# Patient Record
Sex: Male | Born: 1950 | Race: White | Hispanic: No | Marital: Married | Smoking: Former smoker
Health system: Southern US, Community
[De-identification: ages and names within clinical notes are randomized; demographics above are authoritative.]

## PROBLEM LIST (undated history)

## (undated) DIAGNOSIS — K5790 Diverticulosis of intestine, part unspecified, without perforation or abscess without bleeding: Secondary | ICD-10-CM

## (undated) DIAGNOSIS — M545 Low back pain, unspecified: Secondary | ICD-10-CM

## (undated) DIAGNOSIS — E785 Hyperlipidemia, unspecified: Secondary | ICD-10-CM

## (undated) DIAGNOSIS — K219 Gastro-esophageal reflux disease without esophagitis: Secondary | ICD-10-CM

## (undated) DIAGNOSIS — T7840XA Allergy, unspecified, initial encounter: Secondary | ICD-10-CM

## (undated) DIAGNOSIS — F419 Anxiety disorder, unspecified: Secondary | ICD-10-CM

## (undated) DIAGNOSIS — I10 Essential (primary) hypertension: Secondary | ICD-10-CM

## (undated) DIAGNOSIS — H269 Unspecified cataract: Secondary | ICD-10-CM

## (undated) DIAGNOSIS — D126 Benign neoplasm of colon, unspecified: Secondary | ICD-10-CM

## (undated) DIAGNOSIS — M1990 Unspecified osteoarthritis, unspecified site: Secondary | ICD-10-CM

## (undated) DIAGNOSIS — N400 Enlarged prostate without lower urinary tract symptoms: Secondary | ICD-10-CM

## (undated) HISTORY — DX: Anxiety disorder, unspecified: F41.9

## (undated) HISTORY — DX: Low back pain, unspecified: M54.50

## (undated) HISTORY — DX: Diverticulosis of intestine, part unspecified, without perforation or abscess without bleeding: K57.90

## (undated) HISTORY — PX: COLONOSCOPY: SHX174

## (undated) HISTORY — DX: Allergy, unspecified, initial encounter: T78.40XA

## (undated) HISTORY — DX: Benign neoplasm of colon, unspecified: D12.6

## (undated) HISTORY — DX: Gastro-esophageal reflux disease without esophagitis: K21.9

## (undated) HISTORY — DX: Hyperlipidemia, unspecified: E78.5

## (undated) HISTORY — DX: Essential (primary) hypertension: I10

## (undated) HISTORY — DX: Unspecified osteoarthritis, unspecified site: M19.90

## (undated) HISTORY — DX: Benign prostatic hyperplasia without lower urinary tract symptoms: N40.0

## (undated) HISTORY — PX: ANKLE FRACTURE SURGERY: SHX122

## (undated) HISTORY — DX: Low back pain: M54.5

## (undated) HISTORY — DX: Unspecified cataract: H26.9

## (undated) HISTORY — PX: FRACTURE SURGERY: SHX138

## (undated) HISTORY — PX: HERNIA REPAIR: SHX51

## (undated) HISTORY — PX: FINGER SURGERY: SHX640

---

## 2003-12-17 ENCOUNTER — Ambulatory Visit: Payer: Self-pay | Admitting: Internal Medicine

## 2004-01-07 ENCOUNTER — Ambulatory Visit: Payer: Self-pay | Admitting: Internal Medicine

## 2004-03-22 ENCOUNTER — Ambulatory Visit: Payer: Self-pay | Admitting: Internal Medicine

## 2004-05-16 ENCOUNTER — Ambulatory Visit: Payer: Self-pay | Admitting: Internal Medicine

## 2004-07-31 ENCOUNTER — Ambulatory Visit: Payer: Self-pay | Admitting: Internal Medicine

## 2004-08-04 ENCOUNTER — Ambulatory Visit: Payer: Self-pay | Admitting: Internal Medicine

## 2004-08-18 ENCOUNTER — Ambulatory Visit: Payer: Self-pay | Admitting: Internal Medicine

## 2004-11-02 ENCOUNTER — Ambulatory Visit: Payer: Self-pay | Admitting: Internal Medicine

## 2005-03-05 ENCOUNTER — Ambulatory Visit: Payer: Self-pay | Admitting: Internal Medicine

## 2005-08-06 ENCOUNTER — Ambulatory Visit: Payer: Self-pay | Admitting: Internal Medicine

## 2005-08-13 ENCOUNTER — Ambulatory Visit: Payer: Self-pay | Admitting: Internal Medicine

## 2005-09-07 ENCOUNTER — Ambulatory Visit: Payer: Self-pay | Admitting: Gastroenterology

## 2005-09-18 ENCOUNTER — Encounter (INDEPENDENT_AMBULATORY_CARE_PROVIDER_SITE_OTHER): Payer: Self-pay | Admitting: Specialist

## 2005-09-18 ENCOUNTER — Ambulatory Visit: Payer: Self-pay | Admitting: Gastroenterology

## 2005-09-18 ENCOUNTER — Encounter: Payer: Self-pay | Admitting: Internal Medicine

## 2005-10-19 ENCOUNTER — Ambulatory Visit: Payer: Self-pay | Admitting: Internal Medicine

## 2006-08-19 ENCOUNTER — Ambulatory Visit: Payer: Self-pay | Admitting: Internal Medicine

## 2006-08-19 DIAGNOSIS — I10 Essential (primary) hypertension: Secondary | ICD-10-CM

## 2006-08-19 DIAGNOSIS — E785 Hyperlipidemia, unspecified: Secondary | ICD-10-CM

## 2006-08-19 LAB — CONVERTED CEMR LAB
Bilirubin, Direct: 0.3 mg/dL (ref 0.0–0.3)
CO2: 26 meq/L (ref 19–32)
Calcium: 9.2 mg/dL (ref 8.4–10.5)
Eosinophils Absolute: 0.1 10*3/uL (ref 0.0–0.6)
Eosinophils Relative: 0.8 % (ref 0.0–5.0)
GFR calc Af Amer: 112 mL/min
GFR calc non Af Amer: 93 mL/min
Glucose, Bld: 111 mg/dL — ABNORMAL HIGH (ref 70–99)
Glucose, Urine, Semiquant: NEGATIVE
HDL: 42.8 mg/dL (ref >39.0)
Hemoglobin: 15.3 g/dL (ref 13.0–17.0)
Ketones, urine, test strip: NEGATIVE
Lymphocytes Relative: 12.9 % (ref 12.0–46.0)
MCV: 88.4 fL (ref 78.0–100.0)
Monocytes Absolute: 0.7 10*3/uL (ref 0.2–0.7)
Neutro Abs: 9.1 10*3/uL — ABNORMAL HIGH (ref 1.4–7.7)
Neutrophils Relative %: 80 % — ABNORMAL HIGH (ref 43.0–77.0)
PSA: 0.29 ng/mL (ref 0.10–4.00)
Platelets: 243 10*3/uL (ref 150–400)
Potassium: 4.2 meq/L (ref 3.5–5.1)
Sodium: 145 meq/L (ref 135–145)
Specific Gravity, Urine: 1.02
TSH: 0.91 microintl units/mL (ref 0.35–5.50)
Total Protein: 6.7 g/dL (ref 6.0–8.3)
Triglycerides: 117 mg/dL (ref 0–149)
WBC Urine, dipstick: NEGATIVE
WBC: 11.4 10*3/uL — ABNORMAL HIGH (ref 4.5–10.5)
pH: 7

## 2006-08-26 ENCOUNTER — Ambulatory Visit: Payer: Self-pay | Admitting: Internal Medicine

## 2006-08-26 DIAGNOSIS — K5730 Diverticulosis of large intestine without perforation or abscess without bleeding: Secondary | ICD-10-CM | POA: Insufficient documentation

## 2006-08-26 DIAGNOSIS — Z86010 Personal history of colonic polyps: Secondary | ICD-10-CM | POA: Insufficient documentation

## 2006-08-26 DIAGNOSIS — T887XXA Unspecified adverse effect of drug or medicament, initial encounter: Secondary | ICD-10-CM

## 2006-08-27 ENCOUNTER — Telehealth: Payer: Self-pay | Admitting: *Deleted

## 2006-08-29 ENCOUNTER — Telehealth: Payer: Self-pay | Admitting: *Deleted

## 2006-12-17 ENCOUNTER — Ambulatory Visit: Payer: Self-pay | Admitting: Internal Medicine

## 2006-12-17 DIAGNOSIS — N400 Enlarged prostate without lower urinary tract symptoms: Secondary | ICD-10-CM

## 2006-12-17 DIAGNOSIS — C44599 Other specified malignant neoplasm of skin of other part of trunk: Secondary | ICD-10-CM

## 2006-12-17 DIAGNOSIS — M545 Low back pain, unspecified: Secondary | ICD-10-CM | POA: Insufficient documentation

## 2006-12-17 DIAGNOSIS — L570 Actinic keratosis: Secondary | ICD-10-CM

## 2007-02-10 ENCOUNTER — Telehealth: Payer: Self-pay | Admitting: Internal Medicine

## 2007-04-17 ENCOUNTER — Ambulatory Visit: Payer: Self-pay | Admitting: Internal Medicine

## 2007-08-11 ENCOUNTER — Telehealth: Payer: Self-pay | Admitting: *Deleted

## 2007-08-22 ENCOUNTER — Ambulatory Visit: Payer: Self-pay | Admitting: Internal Medicine

## 2007-08-22 LAB — CONVERTED CEMR LAB
ALT: 19 units/L (ref 0–53)
AST: 16 units/L (ref 0–37)
Albumin: 3.9 g/dL (ref 3.5–5.2)
BUN: 15 mg/dL (ref 6–23)
Basophils Absolute: 0 10*3/uL (ref 0.0–0.1)
Basophils Relative: 0.4 % (ref 0.0–3.0)
Bilirubin Urine: NEGATIVE
CO2: 28 meq/L (ref 19–32)
Calcium: 9 mg/dL (ref 8.4–10.5)
Chloride: 110 meq/L (ref 96–112)
Cholesterol: 146 mg/dL (ref 0–200)
Creatinine, Ser: 0.8 mg/dL (ref 0.4–1.5)
Eosinophils Absolute: 0.1 10*3/uL (ref 0.0–0.7)
Eosinophils Relative: 2 % (ref 0.0–5.0)
Glucose, Urine, Semiquant: NEGATIVE
Hemoglobin: 15 g/dL (ref 13.0–17.0)
Lymphocytes Relative: 24.9 % (ref 12.0–46.0)
MCHC: 34.7 g/dL (ref 30.0–36.0)
MCV: 90 fL (ref 78.0–100.0)
Neutro Abs: 4.3 10*3/uL (ref 1.4–7.7)
Neutrophils Relative %: 65.3 % (ref 43.0–77.0)
PSA: 0.18 ng/mL (ref 0.10–4.00)
RBC: 4.81 M/uL (ref 4.22–5.81)
TSH: 1.24 microintl units/mL (ref 0.35–5.50)
VLDL: 22 mg/dL (ref 0–40)
WBC Urine, dipstick: NEGATIVE
WBC: 6.5 10*3/uL (ref 4.5–10.5)
pH: 7

## 2007-09-16 ENCOUNTER — Ambulatory Visit: Payer: Self-pay | Admitting: Internal Medicine

## 2008-03-19 ENCOUNTER — Ambulatory Visit: Payer: Self-pay | Admitting: Internal Medicine

## 2008-03-19 LAB — CONVERTED CEMR LAB
ALT: 21 units/L (ref 0–53)
AST: 20 units/L (ref 0–37)
Alkaline Phosphatase: 74 units/L (ref 39–117)
Bilirubin, Direct: 0.1 mg/dL (ref 0.0–0.3)
Total Bilirubin: 0.9 mg/dL (ref 0.3–1.2)
Total CHOL/HDL Ratio: 3.3

## 2008-03-26 ENCOUNTER — Ambulatory Visit: Payer: Self-pay | Admitting: Internal Medicine

## 2008-06-17 ENCOUNTER — Telehealth: Payer: Self-pay | Admitting: Internal Medicine

## 2008-06-25 ENCOUNTER — Ambulatory Visit: Payer: Self-pay | Admitting: Internal Medicine

## 2008-09-22 ENCOUNTER — Ambulatory Visit: Payer: Self-pay | Admitting: Internal Medicine

## 2008-09-22 LAB — CONVERTED CEMR LAB
AST: 23 units/L (ref 0–37)
Alkaline Phosphatase: 75 units/L (ref 39–117)
Basophils Absolute: 0 10*3/uL (ref 0.0–0.1)
Calcium: 9 mg/dL (ref 8.4–10.5)
GFR calc non Af Amer: 91.92 mL/min (ref >60)
Glucose, Bld: 106 mg/dL — ABNORMAL HIGH (ref 70–99)
Glucose, Urine, Semiquant: NEGATIVE
HDL: 43.9 mg/dL (ref >39.00)
Hemoglobin: 14.9 g/dL (ref 13.0–17.0)
LDL Cholesterol: 91 mg/dL (ref 0–99)
Lymphocytes Relative: 22.4 % (ref 12.0–46.0)
Monocytes Relative: 7.4 % (ref 3.0–12.0)
Neutro Abs: 5.1 10*3/uL (ref 1.4–7.7)
Nitrite: NEGATIVE
Platelets: 203 10*3/uL (ref 150.0–400.0)
Protein, U semiquant: NEGATIVE
RDW: 11.8 % (ref 11.5–14.6)
Sodium: 142 meq/L (ref 135–145)
Total Bilirubin: 1.1 mg/dL (ref 0.3–1.2)
Urobilinogen, UA: 0.2
VLDL: 17.8 mg/dL (ref 0.0–40.0)
WBC: 7.6 10*3/uL (ref 4.5–10.5)
pH: 7

## 2008-09-29 ENCOUNTER — Ambulatory Visit: Payer: Self-pay | Admitting: Internal Medicine

## 2008-12-03 ENCOUNTER — Ambulatory Visit: Payer: Self-pay | Admitting: Internal Medicine

## 2009-01-13 ENCOUNTER — Telehealth: Payer: Self-pay | Admitting: Internal Medicine

## 2009-04-27 ENCOUNTER — Ambulatory Visit: Payer: Self-pay | Admitting: Internal Medicine

## 2009-04-27 LAB — CONVERTED CEMR LAB
Albumin: 4.1 g/dL (ref 3.5–5.2)
HDL: 52.8 mg/dL (ref >39.00)
LDL Cholesterol: 84 mg/dL (ref 0–99)
Total CHOL/HDL Ratio: 3
Triglycerides: 161 mg/dL — ABNORMAL HIGH (ref 0.0–149.0)

## 2009-05-18 ENCOUNTER — Ambulatory Visit: Payer: Self-pay | Admitting: Internal Medicine

## 2009-06-30 ENCOUNTER — Telehealth: Payer: Self-pay | Admitting: Internal Medicine

## 2009-07-01 ENCOUNTER — Ambulatory Visit: Payer: Self-pay | Admitting: Internal Medicine

## 2009-07-01 DIAGNOSIS — G4700 Insomnia, unspecified: Secondary | ICD-10-CM

## 2009-10-04 ENCOUNTER — Ambulatory Visit: Payer: Self-pay | Admitting: Internal Medicine

## 2009-10-04 LAB — CONVERTED CEMR LAB
Albumin: 4.1 g/dL (ref 3.5–5.2)
Alkaline Phosphatase: 65 units/L (ref 39–117)
Basophils Absolute: 0 10*3/uL (ref 0.0–0.1)
Bilirubin Urine: NEGATIVE
CO2: 25 meq/L (ref 19–32)
Calcium: 8.8 mg/dL (ref 8.4–10.5)
Eosinophils Absolute: 0.2 10*3/uL (ref 0.0–0.7)
Glucose, Bld: 91 mg/dL (ref 70–99)
Glucose, Urine, Semiquant: NEGATIVE
HDL: 46.4 mg/dL (ref >39.00)
Hemoglobin: 14.5 g/dL (ref 13.0–17.0)
Ketones, urine, test strip: NEGATIVE
Lymphocytes Relative: 22.5 % (ref 12.0–46.0)
Lymphs Abs: 1.8 10*3/uL (ref 0.7–4.0)
MCHC: 34.5 g/dL (ref 30.0–36.0)
Monocytes Absolute: 0.5 10*3/uL (ref 0.1–1.0)
Neutro Abs: 5.4 10*3/uL (ref 1.4–7.7)
PSA: 0.21 ng/mL (ref 0.10–4.00)
RDW: 13.4 % (ref 11.5–14.6)
Sodium: 139 meq/L (ref 135–145)
Specific Gravity, Urine: 1.025
TSH: 0.87 microintl units/mL (ref 0.35–5.50)
Triglycerides: 122 mg/dL (ref 0.0–149.0)
pH: 6.5

## 2009-10-11 ENCOUNTER — Ambulatory Visit: Payer: Self-pay | Admitting: Internal Medicine

## 2010-01-11 ENCOUNTER — Ambulatory Visit
Admission: RE | Admit: 2010-01-11 | Discharge: 2010-01-11 | Payer: Self-pay | Source: Home / Self Care | Attending: Internal Medicine | Admitting: Internal Medicine

## 2010-02-01 ENCOUNTER — Telehealth: Payer: Self-pay | Admitting: Internal Medicine

## 2010-02-01 ENCOUNTER — Ambulatory Visit
Admission: RE | Admit: 2010-02-01 | Discharge: 2010-02-01 | Payer: Self-pay | Source: Home / Self Care | Attending: Internal Medicine | Admitting: Internal Medicine

## 2010-02-01 ENCOUNTER — Ambulatory Visit (HOSPITAL_COMMUNITY): Admission: AD | Admit: 2010-02-01 | Discharge: 2010-02-02 | Payer: Self-pay | Source: Home / Self Care

## 2010-02-01 DIAGNOSIS — K4030 Unilateral inguinal hernia, with obstruction, without gangrene, not specified as recurrent: Secondary | ICD-10-CM | POA: Insufficient documentation

## 2010-02-06 LAB — CBC
HCT: 40.3 % (ref 39.0–52.0)
Hemoglobin: 14 g/dL (ref 13.0–17.0)
MCH: 31 pg (ref 26.0–34.0)
MCHC: 34.7 g/dL (ref 30.0–36.0)
MCV: 89.2 fL (ref 78.0–100.0)

## 2010-02-06 LAB — COMPREHENSIVE METABOLIC PANEL
CO2: 25 mEq/L (ref 19–32)
Calcium: 8.9 mg/dL (ref 8.4–10.5)
Creatinine, Ser: 1 mg/dL (ref 0.4–1.5)
GFR calc non Af Amer: >60 mL/min (ref >60)
Glucose, Bld: 92 mg/dL (ref 70–99)

## 2010-02-10 NOTE — Op Note (Signed)
NAMEGABRIELLE, Matthew Watson NO.:  [id number]  MEDICAL RECORD NO.:  [id number]          PATIENT TYPE:  INP  LOCATION:  1538                         FACILITY:  [facility]  PHYSICIAN:  Angelia Mould. Derrell Lolling, M.D.DATE OF BIRTH:  Mar 26, 1950  DATE OF PROCEDURE:  02/01/2010 DATE OF DISCHARGE:                              OPERATIVE REPORT   PREOPERATIVE DIAGNOSIS:  Incarcerated right inguinal hernia.  POSTOPERATIVE DIAGNOSIS:  Incarcerated right inguinal hernia.  OPERATION PERFORMED:  Repair of incarcerated right inguinal hernia with UltraPro mesh Office manager).  SURGEON:  Angelia Mould. Derrell Lolling, M.D.  OPERATIVE INDICATIONS:  This is a 60 year old Caucasian man who presents with a 24-hour history of right groin pain and a bulge.  He thinks he has had a hernia there for some time but has never caused any pain.  On exam, he has a large right inguinal hernia that extends down into the scrotum a little bit and although it is soft and there is no skin redness or inflammation, it cannot be reduced after multiple attempts and ice packs.  He is brought to the operating room urgently.  OPERATIVE FINDINGS:  The patient had a large indirect right inguinal hernia.  Once he was under general anesthesia with muscle relaxation, we were able to slowly reduce this completely.  There are no signs of any infection.  There was no sign of any hemorrhage.  There was no odor.  OPERATIVE TECHNIQUE:  Following the induction of general endotracheal anesthesia, the patient's lower abdomen and genitalia were prepped and draped in sterile fashion.  Intravenous antibiotics were given.  The patient was identified with surgical time-out with correct patient, correct procedure and correct site.  We gently and manually reduced the right inguinal hernia.  A 0.5% Marcaine with epinephrine was used as a local infiltration anesthetic.  An oblique incision was made overlying the right inguinal canal.  Dissection  was carried down through the subcutaneous tissue exposing the aponeurosis of the external oblique. The external oblique was incised in the direction of its fibers, opening up the external inguinal ring.  The external oblique was dissected away from the underlying tissues and self-retaining retractors were placed. There was a chronically scarred complex cord and indirect hernia sac. The ilioinguinal nerve was intimately associated with this and so it was dissected back to its emergence from the muscle laterally, clamped, divided and ligated with 2-0 silk tie.  The  nerve medially was excised.  The cord structures were encircled with Penrose drain.  There really was not much of a direct hernia or weakness there.  He did have a very large indirect hernia sac which was slowly and carefully dissected away from the testicular artery and vein of the vas deferens.  We got this completely freed and dissected all the way back to the internal ring.  We then opened the indirect sac and inspected it.  There were no entrapped contents at this time.  There was no odor and no bleeding.  We twisted the indirect sac and suture-ligated it at the level of the internal ring with suture ligature of 2-0 silk. The redundant sac was excised and  discarded.  The wound was irrigated with saline.  The floor of the inguinal canal was repaired and reinforced with an Onlay graft of UltraPro mesh.  A 3 inch x 6 inch piece of this was brought to the operative field.  It was trimmed at the corners a little bit and sutured in place with running sutures of 2-0 Prolene and interrupted sutures of 2-0 Prolene.  The mesh was sutured so as to generously overlap the fascia at pubic tubercle, then along the inguinal ligament inferiorly.  The mesh was incised laterally so as to wrap around the cord structures of the internal ring.  Medially and superiorly, several interrupted mattress sutures of 2-0 Prolene were placed.  The tails of  the mesh were overlapped laterally.  Some interrupted sutures were placed laterally and one of these was tied to the running suture which had come inferiorly along the inguinal ligament.  I placed a Prolene suture just lateral to the cord structures to tighten up the mesh around the cord.  This provided very secure repair both medial and lateral to the internal ring but allowed an adequate fingertip opening for the cord structures.  There was no bleeding.  The wound was irrigated with saline.  The external oblique was closed with a running suture of 2-0 Vicryl, placing the cord structures deep to the external oblique.  Scarpa fascia was closed with running suture of 2-0 Vicryl.  The skin was closed with running subcuticular suture of 4-0 Monocryl and Steri-Strips.  Clean bandages were placed and the patient was taken recovery room in stable condition. Estimated blood loss was about 20 cc or less.  Complications none. Sponge, needle and instrument counts were correct.     Angelia Mould. Derrell Lolling, M.D.     HMI/MEDQ  D:  02/01/2010  T:  02/01/2010  Job:  [id number]  cc:   Stacie Glaze, MD [address] [place] [address]  Electronically Signed by Claud Kelp M.D. on 02/08/2010 10:00:26 AM

## 2010-02-10 NOTE — Discharge Summary (Signed)
Matthew Watson, Matthew Watson NO.:  [id number]  MEDICAL RECORD NO.:  [id number]          PATIENT TYPE:  INP  LOCATION:  1538                         FACILITY:  [facility]  PHYSICIAN:  Angelia Mould. Derrell Lolling, M.D.DATE OF BIRTH:  1950/10/26  DATE OF ADMISSION:  02/01/2010 DATE OF DISCHARGE:  02/02/2010                              DISCHARGE SUMMARY   ADMISSION DIAGNOSIS:  Incarcerated right inguinal hernia.  DISCHARGE DIAGNOSES: 1. Incarcerated right inguinal hernia. 2. Dyslipidemia. 3. Hypertension. 4. Gastroesophageal reflux disease. 5. Benign prostatic hyperplasia.  PROCEDURES:  Repair of incarcerated right inguinal hernia with Ultrapro mesh Lichtenstein repair on February 01, 2010, Dr. Derrell Lolling.  BRIEF HISTORY:  The patient is a 60 year old male who was seen in [facility] due to a right groin bulge.  He was initially seen by [name], his primary care who found the inguinal hernia and referred him for evaluation.  He had 8/10 pain located in his right lower quadrant.  [name] could not reduce it.  He was seen in the office and admitted.  He had some nausea but no change in bowel or bladder function.  PAST MEDICAL HISTORY:  Hyperlipidemia, hypertension, GERD, and BPH.  PAST SURGICAL HISTORY:  One orthopedic procedure in his leg.  MEDICATIONS ON ADMISSION: 1. Lipitor 40 one daily. 2. Aspirin 81 mg daily. 3. Proscar 5 mg a day. 4. Prilosec 20 mg daily. 5. Vicodin q.6h. p.r.n. for lower back pain. 6. Fexofenadine 1 b.i.d. 7. Amlodipine 5 mg daily. 8. Meloxicam 15 mg daily.  ALLERGIES:  None.  For further history and physical, see the dictated note.  HOSPITAL COURSE:  The patient was admitted to the hospital and after evaluation, was taken to the OR by Dr. Derrell Lolling.  On the OR table, he underwent sedation.  There are no signs of infection.  He was placed on IV antibiotics.  The hernia was reduced and then he underwent the procedure as described  above.  He tolerated the procedure well.  The following morning, he was changed from IV pain medicine to p.o. and mobilized, and it was Dr. Annette Stable opinion that he could be discharged home in the morning after his procedure.   DISCHARGE MEDS: amlodipine/benazepril 5/10 1 QD aspirin 81 mg daily atorvastatin 40mg  qd hydrocodone/APAP p.r.n. q.6h meloxicam 15 mg daily, Prilosec 20 mg daily Proscar 5 mg daily.  Percocet at his request for pain and told to hold his Vicodin while he is on that.   aspirin 325 MG q6h X 7 days, then resume baby asprin 1 daily  FOLLOW UP:  He will come back to the office in 2 weeks on February 14, 2010 for clinic evaluation of his incisions.  He is instructed to remove his dressings in 72 hours.  He can shower, remove Steri-Strips when they start to peel.  He is to call the office if he has any further problems.     Eber Hong, P.A.   ______________________________ Angelia Mould. Derrell Lolling, M.D.    WDJ/MEDQ  D:  02/02/2010  T:  02/02/2010  Job:  [id number]  cc:   [name]  Electronically Signed by Yehuda Mao  JENNINGS P.A. on 02/03/2010 04:04:47 PM Electronically Signed by Claud Kelp M.D. on 02/08/2010 10:01:12 AM

## 2010-02-14 NOTE — Assessment & Plan Note (Signed)
Summary: insomnia/bmw   Vital Signs:  Patient profile:   60 year old male Height:      69 inches Weight:      216 pounds BMI:     32.01 Temp:     98.2 degrees F oral Resp:     14 per minute BP sitting:   120 / 80  (left arm)  Vitals Entered By: Willy Eddy, LPN (July 01, 2009 1:30 PM) CC: c/o insomnia and anxiety attacks, Hypertension Management   CC:  c/o insomnia and anxiety attacks and Hypertension Management.  History of Present Illness: 10-12 days of insomnia at first related to waking up to "pee" cut pout beer and coffee and the urination stopped but the insomnia did not stop having events at work when overwhelmed and has had panic attacks has had transient fatigue increased anxiety no hx of depression blood pressure well controlled and back pain is controlled  Hypertension History:      He denies headache, chest pain, palpitations, dyspnea with exertion, orthopnea, PND, peripheral edema, visual symptoms, neurologic problems, syncope, and side effects from treatment.        Positive major cardiovascular risk factors include male age 31 years old or older, hyperlipidemia, and hypertension.  Negative major cardiovascular risk factors include non-tobacco-user status.     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.0) 5)  Preventive Health Care  (ICD-V70.0) 6)  Diverticulitis, Colon W/hem  (ICD-562.13) 7)  Advef, Drug/medicinal/biological Subst Nos  (ICD-995.20) 8)  Diverticulosis, Colon  (ICD-562.10) 9)  Colonic Polyps, Hx of  (ICD-V12.72) 10)  Family History Diabetes 1st Degree Relative  (ICD-V18.0) 11)  Family History  of Melanoma  (ICD-V16.8) 12)  Benign Prostatic Hypertrophy  (ICD-600.00) 13)  Hypertension  (ICD-401.9) 14)  Hyperlipidemia  (ICD-272.4)  Current Problems (verified): 1)  Benign Prostatic Hypertrophy, With Obstruction  (ICD-600.01) 2)  Low Back Pain  (ICD-724.2) 3)  Neoplasm, Malignant, Skin, 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 Take 1 Tablet By Mouth Once A Day 2)  Aspirin 81 Mg  Tabs (Aspirin) .... Take 1 Tablet By Mouth Once A Day 3)  Proscar 5 Mg  Tabs (Finasteride) .... One By Mouth Daily 4)  Omeprazole 20 Mg  Cpdr (Omeprazole) .... One By Mouth Daily 5)  Hydrocodone-Acetaminophen 5-500 Mg Tabs (Hydrocodone-Acetaminophen) .... One By Mouth Every 6-8 Hours Prn 6)  Fexofenadine-Pseudoephedrine 60-120 Mg Xr12h-Tab (Fexofenadine-Pseudoephedrine) .... One By Mouth Two Times A Day 7)  Amlodipine Besy-Benazepril Hcl 5-10 Mg Caps (Amlodipine Besy-Benazepril Hcl) .... One By Mouth Daily 8)  Meloxicam 15 Mg Tabs (Meloxicam) .... One By  Mouth Daily  Current Medications (verified): 1)  Zocor 80 Mg  Tabs (Simvastatin) .... Take 1 Tablet By Mouth Once A Day 2)  Aspirin 81 Mg  Tabs (Aspirin) .... Take 1 Tablet By Mouth Once A Day 3)  Proscar 5 Mg  Tabs (Finasteride) .... One By Mouth Daily 4)  Omeprazole 20 Mg  Cpdr (Omeprazole) .... One By Mouth Daily 5)  Hydrocodone-Acetaminophen 5-500 Mg Tabs (Hydrocodone-Acetaminophen) .... One By Mouth Every 6-8 Hours Prn 6)   Fexofenadine-Pseudoephedrine 60-120 Mg Xr12h-Tab (Fexofenadine-Pseudoephedrine) .... One By Mouth Two Times A Day 7)  Amlodipine Besy-Benazepril Hcl 5-10 Mg Caps (Amlodipine Besy-Benazepril Hcl) .... One By Mouth Daily 8)  Meloxicam 15 Mg Tabs (Meloxicam) .... One By Mouth Daily 9)  Silenor 6 Mg Tabs (Doxepin Hcl) .... One By Mouth  1/2 Hour Before Bed  Allergies (verified): No Known Drug Allergies  Past History:  Family History: Last updated: 08/19/2006 Family History of Melanoma Family History Other cancer-Bone,Lymph nodes Family History of Arthritis Family History Diabetes 1st degree relative  Social History: Last updated: 12/17/2006 Married Former Smoker  Risk Factors: Smoking Status: quit (07/01/2009) Packs/Day: 1.0 (07/01/2009) Passive Smoke Exposure: no (07/01/2009)  Past medical, surgical, family and social histories (including risk factors) reviewed, and no changes noted (except as noted below).  Past Medical History: Reviewed history from 12/17/2006 and no changes required. Hyperlipidemia Hypertension Benign prostatic hypertrophy Colonic polyps, hx of Diverticulosis, colon Low back pain  Past Surgical History: Reviewed history from 08/26/2006 and no changes required. ankle fx Colonoscopy-09/18/2005 polyps  Family History: Reviewed history from 08/19/2006 and no changes required. Family History of Melanoma Family History Other cancer-Bone,Lymph nodes Family History of Arthritis Family History Diabetes 1st degree relative  Social History: Reviewed history from 12/17/2006 and no changes required. Married Former Smoker  Review of Systems  The patient denies anorexia, fever, weight loss, weight gain, vision loss, decreased hearing, hoarseness, chest pain, syncope, dyspnea on exertion, peripheral edema, prolonged cough, headaches, hemoptysis, abdominal pain, melena, hematochezia, severe indigestion/heartburn, hematuria, incontinence, genital sores, muscle  weakness, suspicious skin lesions, transient blindness, difficulty walking, depression, unusual weight change, abnormal bleeding, enlarged lymph nodes, angioedema, and breast masses.    Physical Exam  General:  Well-developed,well-nourished,in no acute distress; alert,appropriate and cooperative throughout examination Head:  Normocephalic and atraumatic without obvious abnormalities. No apparent alopecia or balding. Eyes:  vision grossly intact, pupils equal, and pupils round.   Nose:  no external deformity and no nasal discharge.   Mouth:  Oral mucosa and oropharynx without lesions or exudates.  Teeth in good repair. Neck:  No deformities, masses, or tenderness noted. Lungs:  normal respiratory effort and no wheezes.   Heart:  normal rate and regular rhythm.   Abdomen:  soft, normal bowel sounds, no distention, no guarding, no rigidity, no rebound tenderness, epigastric tenderness, and LLQ tenderness.   Msk:  lumbar lordosis, SI joint tenderness, and trigger point tenderness.   Neurologic:  alert & oriented X3 and finger-to-nose normal.     Impression & Recommendations:  Problem # 1:  INSOMNIA, CHRONIC (ICD-307.42) samples given and sleep hygene discussed  Problem # 2:  LOW BACK PAIN (ICD-724.2) stable His updated medication list for this problem includes:    Aspirin 81 Mg Tabs (Aspirin) .Marland Kitchen... Take 1 tablet by mouth once a day    Hydrocodone-acetaminophen 5-500 Mg Tabs (Hydrocodone-acetaminophen) ..... One by mouth every 6-8 hours prn    Meloxicam 15 Mg Tabs (Meloxicam) ..... One by mouth daily  Problem # 3:  HYPERTENSION (ICD-401.9) stable His updated medication list for this problem includes:    Amlodipine Besy-benazepril Hcl 5-10 Mg Caps (Amlodipine besy-benazepril hcl) ..... One by mouth daily  BP today: 120/80 Prior BP: 130/84 (05/18/2009)  10 Yr Risk Heart Disease: 4 % Prior 10 Yr Risk Heart Disease: 6 % (05/18/2009)  Labs Reviewed: K+: 4.1 (09/22/2008) Creat: : 0.9  (09/22/2008)   Chol: 169 (04/27/2009)   HDL: 52.80 (04/27/2009)   LDL: 84 (04/27/2009)   TG: 161.0 (04/27/2009)  Complete Medication List: 1)  Zocor 80 Mg Tabs (Simvastatin) .... Take 1 tablet by mouth once a day 2)  Aspirin 81 Mg Tabs (Aspirin) .... Take 1 tablet by mouth once a day 3)  Proscar 5 Mg Tabs (Finasteride) .... One by mouth daily 4)  Omeprazole 20 Mg Cpdr (Omeprazole) .... One by mouth daily 5)  Hydrocodone-acetaminophen 5-500 Mg Tabs (Hydrocodone-acetaminophen) .... One by mouth every 6-8 hours prn 6)  Fexofenadine-pseudoephedrine 60-120 Mg Xr12h-tab (Fexofenadine-pseudoephedrine) .... One by mouth two times a day 7)  Amlodipine Besy-benazepril Hcl 5-10 Mg Caps (Amlodipine besy-benazepril hcl) .... One by mouth daily 8)  Meloxicam 15 Mg Tabs (Meloxicam) .... One by mouth daily 9)  Silenor 6 Mg Tabs (Doxepin hcl) .... One by mouth  1/2 hour before bed  Hypertension Assessment/Plan:      The patient's hypertensive risk group is category B: At least one risk factor (excluding diabetes) with no target organ damage.  His calculated 10 year risk of coronary heart disease is 4 %.  Today's blood pressure is 120/80.  His blood pressure goal is < 140/90.  Patient Instructions: 1)  samples given and side efects addressed 2)  f/u in august as scheduled

## 2010-02-14 NOTE — Progress Notes (Signed)
Summary: insomnia  Phone Note From Other Clinic Call back at [facility] Phone [phone]   Caller: Patient Call For: Stacie Glaze MD Reason for Call: Privacy/Consent Authorization, Referral Summary of Call: Pt is asking to see [name] for insomnia x 10 days.  States he had an anxiety attack last week. Karin Golden [facility]) [phone] Initial call taken by: Lynann Beaver CMA,  June 30, 2009 9:35 AM  Follow-up for Phone Call        may see dr Lovell Sheehan tomorrow at 1:15 Follow-up by: Willy Eddy, LPN,  June 30, 2009 10:21 AM

## 2010-02-14 NOTE — Assessment & Plan Note (Signed)
Summary: 5 MO ROV/MM---- PT [facility] // RS   Vital Signs:  Patient profile:   60 year old male Height:      69 inches Weight:      216 pounds BMI:     32.01 Temp:     98.8 degrees F oral Pulse rate:   76 / minute Resp:     14 per minute BP sitting:   130 / 84  (left arm)  Vitals Entered By: Willy Eddy, LPN (May 18, 1608 10:46 AM)  Nutrition Counseling: Patient's BMI is greater than 25 and therefore counseled on weight management options. CC: roa labs, Hypertension Management   CC:  roa labs and Hypertension Management.  History of Present Illness: The pt presents for managemtn of lipids and chronic back pain as well as HTN  Hypertension History:      He denies headache, chest pain, palpitations, dyspnea with exertion, orthopnea, PND, peripheral edema, visual symptoms, neurologic problems, syncope, and side effects from treatment.  He notes no problems with any antihypertensive medication side effects.  good controil at goals.        Positive major cardiovascular risk factors include male age 21 years old or older, hyperlipidemia, and hypertension.  Negative major cardiovascular risk factors include non-tobacco-user status.     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.0) 5)  Preventive Health Care  (ICD-V70.0) 6)  Diverticulitis, Colon W/hem  (ICD-562.13) 7)  Advef, Drug/medicinal/biological Subst Nos  (ICD-995.20) 8)  Diverticulosis, Colon  (ICD-562.10) 9)  Colonic Polyps, Hx of  (ICD-V12.72) 10)  Family History Diabetes 1st Degree Relative  (ICD-V18.0) 11)  Family History of Melanoma  (ICD-V16.8) 12)  Benign Prostatic  Hypertrophy  (ICD-600.00) 13)  Hypertension  (ICD-401.9) 14)  Hyperlipidemia  (ICD-272.4)  Current Problems (verified): 1)  Benign Prostatic Hypertrophy, With Obstruction  (ICD-600.01) 2)  Low Back Pain  (ICD-724.2) 3)  Neoplasm, Malignant, Skin, 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 Take 1 Tablet By Mouth Once A Day 2)  Aspirin 81 Mg  Tabs (Aspirin) .... Take 1 Tablet By Mouth Once A Day 3)  Proscar 5 Mg  Tabs (Finasteride) .... One By Mouth Daily 4)  Omeprazole 20 Mg  Cpdr (Omeprazole) .... One By Mouth Daily 5)  Hydrocodone-Acetaminophen 5-500 Mg Tabs (Hydrocodone-Acetaminophen) .... One By Mouth Every 6-8 Hours Prn 6)  Fexofenadine-Pseudoephedrine 60-120 Mg Xr12h-Tab (Fexofenadine-Pseudoephedrine) .... One By Mouth Two Times A Day 7)  Amlodipine Besy-Benazepril Hcl 5-10 Mg Caps (Amlodipine Besy-Benazepril Hcl) .... One By Mouth Daily 8)  Meloxicam 15 Mg Tabs (Meloxicam) .... One By Mouth Daily  9)  Amoxicillin 500 Mg Caps (Amoxicillin) .... One Po Three Times A Day  Current Medications (verified): 1)  Zocor 80 Mg  Tabs (Simvastatin) .... Take 1 Tablet By Mouth Once A Day 2)  Aspirin 81 Mg  Tabs (Aspirin) .... Take 1 Tablet By Mouth Once A Day 3)  Proscar 5 Mg  Tabs (Finasteride) .... One By Mouth Daily 4)  Omeprazole 20 Mg  Cpdr (Omeprazole) .... One By Mouth Daily 5)  Hydrocodone-Acetaminophen 5-500 Mg Tabs (Hydrocodone-Acetaminophen) .... One By Mouth  Every 6-8 Hours Prn 6)  Fexofenadine-Pseudoephedrine 60-120 Mg Xr12h-Tab (Fexofenadine-Pseudoephedrine) .... One By Mouth Two Times A Day 7)  Amlodipine Besy-Benazepril Hcl 5-10 Mg Caps (Amlodipine Besy-Benazepril Hcl) .... One By Mouth Daily 8)  Meloxicam 15 Mg Tabs (Meloxicam) .... One By Mouth Daily  Allergies (verified): No Known Drug Allergies  Past History:  Family History: Last updated: 08/19/2006 Family History of Melanoma Family History Other cancer-Bone,Lymph nodes Family History of Arthritis Family History Diabetes 1st degree relative  Social History: Last updated: 12/17/2006 Married Former Smoker  Risk Factors: Smoking Status: quit (05/18/2009) Packs/Day: 1.0 (05/18/2009) Passive Smoke Exposure: no (05/18/2009)  Past medical, surgical, family and social histories (including risk factors) reviewed, and no changes noted (except as noted below).  Past Medical History: Reviewed history from 12/17/2006 and no changes required. Hyperlipidemia Hypertension Benign prostatic hypertrophy Colonic polyps, hx of Diverticulosis, colon Low back pain  Past Surgical History: Reviewed history from 08/26/2006 and no changes required. ankle fx Colonoscopy-09/18/2005 polyps  Family History: Reviewed history from 08/19/2006 and no changes required. Family History of Melanoma Family History Other cancer-Bone,Lymph nodes Family History of Arthritis Family History Diabetes 1st degree relative  Social History: Reviewed history from 12/17/2006 and no changes required. Married Former Smoker  Review of Systems  The patient denies anorexia, fever, weight loss, weight gain, vision loss, decreased hearing, hoarseness, chest pain, syncope, dyspnea on exertion, peripheral edema, prolonged cough, headaches, hemoptysis, abdominal pain, melena, hematochezia, severe indigestion/heartburn, hematuria, incontinence, genital sores, muscle weakness, suspicious skin lesions, transient  blindness, difficulty walking, depression, unusual weight change, abnormal bleeding, enlarged lymph nodes, angioedema, and breast masses.    Physical Exam  General:  Well-developed,well-nourished,in no acute distress; alert,appropriate and cooperative throughout examination Head:  Normocephalic and atraumatic without obvious abnormalities. No apparent alopecia or balding. Eyes:  vision grossly intact, pupils equal, and pupils round.   Nose:  no external deformity and no nasal discharge.   Neck:  No deformities, masses, or tenderness noted. Lungs:  normal respiratory effort and no wheezes.   Heart:  normal rate and regular rhythm.   Abdomen:  soft, normal bowel sounds, no distention, no guarding, no rigidity, no rebound tenderness, epigastric tenderness, and LLQ tenderness.   Msk:  lumbar lordosis, SI joint tenderness, and trigger point tenderness.   Extremities:  trace left pedal edema and trace right pedal edema.   Neurologic:  alert & oriented X3 and finger-to-nose normal.     Impression & Recommendations:  Problem # 1:  HYPERTENSION (ICD-401.9) Assessment Improved  His updated medication list for this problem includes:    Amlodipine Besy-benazepril Hcl 5-10 Mg Caps (Amlodipine besy-benazepril hcl) ..... One by mouth daily  BP today: 140/84 repeat 130/84 Prior BP: 130/80 (12/03/2008)  Prior 10 Yr Risk Heart Disease: 7 % (04/17/2007)  Labs Reviewed: K+: 4.1 (09/22/2008) Creat: : 0.9 (09/22/2008)   Chol: 169 (04/27/2009)   HDL: 52.80 (04/27/2009)   LDL: 84 (04/27/2009)   TG: 161.0 (04/27/2009)  Problem # 2:  HYPERLIPIDEMIA (ICD-272.4) Assessment: Unchanged stable with good liver functions weiught rediuction to aid the lower triglycerides His updated medication list for this problem includes:    Zocor 80 Mg Tabs (Simvastatin) .Marland Kitchen... Take 1 tablet by mouth once a day  Labs Reviewed: SGOT: 22 (04/27/2009)   SGPT: 25 (04/27/2009)  Prior 10 Yr Risk Heart Disease: 7 %  (04/17/2007)   HDL:52.80 (04/27/2009), 43.90 (09/22/2008)  LDL:84 (04/27/2009), 91 (09/22/2008)  Chol:169 (04/27/2009), 153 (09/22/2008)  Trig:161.0 (04/27/2009), 89.0 (09/22/2008)  Problem # 3:  BENIGN PROSTATIC HYPERTROPHY (ICD-600.00) Assessment: Unchanged improved His updated medication list for this problem includes:    Proscar 5 Mg Tabs (Finasteride) ..... One by mouth daily  Problem # 4:  LOW BACK PAIN (ICD-724.2) Assessment: Unchanged  refill meds His updated medication list for this problem includes:    Aspirin 81 Mg Tabs (Aspirin) .Marland Kitchen... Take 1 tablet by mouth once a day    Hydrocodone-acetaminophen 5-500 Mg Tabs (Hydrocodone-acetaminophen) ..... One by mouth every 6-8 hours prn    Meloxicam 15 Mg Tabs (Meloxicam) ..... One by mouth daily  Discussed use of moist heat or ice, modified activities, medications, and stretching/strengthening exercises. Back care instructions given. To be seen in 2 weeks if no improvement; sooner if worsening of symptoms.   Complete Medication List: 1)  Zocor 80 Mg Tabs (Simvastatin) .... Take 1 tablet by mouth once a day 2)  Aspirin 81 Mg Tabs (Aspirin) .... Take 1 tablet by mouth once a day 3)  Proscar 5 Mg Tabs (Finasteride) .... One by mouth daily 4)  Omeprazole 20 Mg Cpdr (Omeprazole) .... One by mouth daily 5)  Hydrocodone-acetaminophen 5-500 Mg Tabs (Hydrocodone-acetaminophen) .... One by mouth every 6-8 hours prn 6)  Fexofenadine-pseudoephedrine 60-120 Mg Xr12h-tab (Fexofenadine-pseudoephedrine) .... One by mouth two times a day 7)  Amlodipine Besy-benazepril Hcl 5-10 Mg Caps (Amlodipine besy-benazepril hcl) .... One by mouth daily 8)  Meloxicam 15 Mg Tabs (Meloxicam) .... One by mouth daily  Hypertension Assessment/Plan:      The patient's hypertensive risk group is category B: At least one risk factor (excluding diabetes) with no target organ damage.  His calculated 10 year risk of coronary heart disease is 6 %.  Today's blood pressure is  130/84.  His blood pressure goal is < 140/90.  Patient Instructions: 1)  AUG for CPX Prescriptions: HYDROCODONE-ACETAMINOPHEN 5-500 MG TABS (HYDROCODONE-ACETAMINOPHEN) one by mouth every 6-8 hours prn  #60 x 6   Entered by:   Willy Eddy, LPN   Authorized by:   Stacie Glaze MD   Signed by:   Willy Eddy, LPN on 78/29/5621   Method used:   Print then Give to Patient   RxID:   [id number]

## 2010-02-14 NOTE — Assessment & Plan Note (Signed)
Summary: cpx/njr/pt rescd from bump//ccm   Vital Signs:  Patient profile:   60 year old male Height:      69 inches Weight:      218 pounds BMI:     32.31 Temp:     98.2 degrees F oral Pulse rate:   72 / minute Resp:     14 per minute BP sitting:   140 / 80  (left arm)  Vitals Entered By: Willy Eddy, LPN (October 11, 2009 11:48 AM)  Nutrition Counseling: Patient's BMI is greater than 25 and therefore counseled on weight management options. CC: cpx, Hypertension Management Is Patient Diabetic? No   Primary Care Provider:  Stacie Glaze MD  CC:  cpx and Hypertension Management.  History of Present Illness: The pt was asked about all immunizations, health maint. services that are appropriate to their age and was given guidance on diet exercize  and weight management  slight increased weight with managemnt councillng   Hypertension History:      He denies headache, chest pain, palpitations, dyspnea with exertion, orthopnea, PND, peripheral edema, visual symptoms, neurologic problems, syncope, and side effects from treatment.  stable.        Positive major cardiovascular risk factors include male age 38 years old or older, hyperlipidemia, and hypertension.  Negative major cardiovascular risk factors include non-tobacco-user status.     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) 5)  Actinic Keratosis  (ICD-702.0) 6)  Preventive Health Care  (ICD-V70.0) 7)  Diverticulitis, Colon W/hem  (ICD-562.13) 8)  Advef, Drug/medicinal/biological Subst Nos  (ICD-995.20) 9)  Diverticulosis, Colon  (ICD-562.10) 10)  Colonic Polyps,  Hx of  (ICD-V12.72) 11)  Family History Diabetes 1st Degree Relative  (ICD-V18.0) 12)  Family History of Melanoma  (ICD-V16.8) 13)  Benign Prostatic Hypertrophy  (ICD-600.00) 14)  Hypertension  (ICD-401.9) 15)  Hyperlipidemia  (ICD-272.4)  Current Problems (verified): 1)  Insomnia, Chronic  (ICD-307.42) 2)  Benign Prostatic Hypertrophy, With Obstruction  (ICD-600.01) 3)  Low Back Pain  (ICD-724.2) 4)  Neoplasm, Malignant, Skin, 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 Take 1 Tablet By Mouth Once A Day 2)  Aspirin 81 Mg  Tabs (Aspirin) .... Take 1 Tablet By Mouth Once A Day 3)  Proscar 5 Mg  Tabs (Finasteride) .... One By Mouth Daily 4)  Omeprazole 20 Mg  Cpdr (Omeprazole) .... One By Mouth Daily 5)  Hydrocodone-Acetaminophen 5-500 Mg Tabs (Hydrocodone-Acetaminophen) .... One By Mouth Every 6-8 Hours Prn 6)  Fexofenadine-Pseudoephedrine 60-120 Mg Xr12h-Tab (Fexofenadine-Pseudoephedrine) .... One By Mouth Two Times A Day 7)  Amlodipine  Besy-Benazepril Hcl 5-10 Mg Caps (Amlodipine Besy-Benazepril Hcl) .... One By Mouth Daily 8)  Meloxicam 15 Mg Tabs (Meloxicam) .... One By Mouth Daily 9)  Silenor 6 Mg Tabs (Doxepin Hcl) .... One By Mouth  1/2 Hour Before Bed  Current Medications (verified): 1)  Zocor 80 Mg  Tabs (Simvastatin) .... Take 1 Tablet By Mouth Once A Day 2)  Aspirin 81 Mg  Tabs (Aspirin) .... Take 1 Tablet By Mouth Once A Day 3)  Proscar 5 Mg  Tabs  (Finasteride) .... One By Mouth Daily 4)  Omeprazole 20 Mg  Cpdr (Omeprazole) .... One By Mouth Daily 5)  Hydrocodone-Acetaminophen 5-500 Mg Tabs (Hydrocodone-Acetaminophen) .... One By Mouth Every 6-8 Hours Prn 6)  Fexofenadine-Pseudoephedrine 60-120 Mg Xr12h-Tab (Fexofenadine-Pseudoephedrine) .... One By Mouth Two Times A Day 7)  Amlodipine Besy-Benazepril Hcl 5-10 Mg Caps (Amlodipine Besy-Benazepril Hcl) .... One By Mouth Daily 8)  Meloxicam 15 Mg Tabs (Meloxicam) .... One By Mouth Daily  Allergies (verified): No Known Drug Allergies  Past History:  Family History: Last updated: 08/19/2006 Family History of Melanoma Family History Other cancer-Bone,Lymph nodes Family History of Arthritis Family History Diabetes 1st degree relative  Social History: Last updated: 12/17/2006 Married Former Smoker  Risk Factors: Smoking Status: quit (10/11/2009) Packs/Day: 1.0 (10/11/2009) Passive Smoke Exposure: no (10/11/2009)  Past medical, surgical, family and social histories (including risk factors) reviewed, and no changes noted (except as noted below).  Past Medical History: Reviewed history from 12/17/2006 and no changes required. Hyperlipidemia Hypertension Benign prostatic hypertrophy Colonic polyps, hx of Diverticulosis, colon Low back pain  Past Surgical History: Reviewed history from 08/26/2006 and no changes required. ankle fx Colonoscopy-09/18/2005 polyps  Family History: Reviewed history from 08/19/2006 and no changes required. Family History of Melanoma Family History Other cancer-Bone,Lymph nodes Family History of Arthritis Family History Diabetes 1st degree relative  Social History: Reviewed history from 12/17/2006 and no changes required. Married Former Smoker  Review of Systems  The patient denies anorexia, fever, weight loss, weight gain, vision loss, decreased hearing, hoarseness, chest pain, syncope, dyspnea on exertion, peripheral edema, prolonged  cough, headaches, hemoptysis, abdominal pain, melena, hematochezia, severe indigestion/heartburn, hematuria, incontinence, genital sores, muscle weakness, suspicious skin lesions, transient blindness, difficulty walking, depression, unusual weight change, abnormal bleeding, enlarged lymph nodes, angioedema, and breast masses.    Physical Exam  General:  Well-developed,well-nourished,in no acute distress; alert,appropriate and cooperative throughout examination Head:  Normocephalic and atraumatic without obvious abnormalities. No apparent alopecia or balding. Eyes:  vision grossly intact, pupils equal, and pupils round.   Nose:  no external deformity and no nasal discharge.   Mouth:  Oral mucosa and oropharynx without lesions or exudates.  Teeth in good repair. Neck:  No deformities, masses, or tenderness noted. Lungs:  normal respiratory effort and no wheezes.   Heart:  normal rate and regular rhythm.   Abdomen:  soft, normal bowel sounds, no distention, no guarding, no rigidity, no rebound tenderness, epigastric tenderness, and LLQ tenderness.     Impression & Recommendations:  Problem # 1:  PREVENTIVE HEALTH CARE (ICD-V70.0) The pt was asked about all immunizations, health maint. services that are appropriate to their age and was given guidance on diet exercize  and weight management  Colonoscopy: Results: Polyp.  Results: Diverticulosis.       Location:  [facility] .   (09/18/2005) Td Booster: Historical (01/15/2005)   Flu Vax: Historical (11/18/2008)   Chol: 166 (10/04/2009)   HDL: 46.40 (10/04/2009)   LDL: 95 (10/04/2009)  TG: 122.0 (10/04/2009) TSH: 0.87 (10/04/2009)   PSA: 0.21 (10/04/2009) Next Colonoscopy due:: 09/2010 (08/26/2006)  Discussed using sunscreen, use of alcohol, drug use, self testicular exam, routine dental care, routine eye care, routine physical exam, seat belts, multiple vitamins, osteoporosis prevention, adequate calcium intake in diet, and recommendations for  immunizations.  Discussed exercise and checking cholesterol.  Discussed gun safety, safe sex, and contraception. Also recommend checking PSA.  Problem # 2:  HYPERTENSION (ICD-401.9) Assessment: Unchanged  His updated medication list for this problem includes:    Amlodipine Besy-benazepril Hcl 5-10 Mg Caps (Amlodipine besy-benazepril hcl) ..... One by mouth daily  BP today: 140/80 Prior BP: 120/80 (07/01/2009)  10 Yr Risk Heart Disease: 7 % Prior 10 Yr Risk Heart Disease: 4 % (07/01/2009)  Labs Reviewed: K+: 4.2 (10/04/2009) Creat: : 0.8 (10/04/2009)   Chol: 166 (10/04/2009)   HDL: 46.40 (10/04/2009)   LDL: 95 (10/04/2009)   TG: 122.0 (10/04/2009)  Problem # 3:  LOW BACK PAIN (ICD-724.2)  His updated medication list for this problem includes:    Aspirin 81 Mg Tabs (Aspirin) .Marland Kitchen... Take 1 tablet by mouth once a day    Hydrocodone-acetaminophen 5-500 Mg Tabs (Hydrocodone-acetaminophen) ..... One by mouth every 6-8 hours prn    Meloxicam 15 Mg Tabs (Meloxicam) ..... One by mouth daily  Discussed use of moist heat or ice, modified activities, medications, and stretching/strengthening exercises. Back care instructions given. To be seen in 2 weeks if no improvement; sooner if worsening of symptoms.   Problem # 4:  PREVENTIVE HEALTH CARE (ICD-V70.0) Assessment: Unchanged  Colonoscopy: Results: Polyp.  Results: Diverticulosis.       Location:  [facility] .   (09/18/2005) Td Booster: Historical (01/15/2005)   Flu Vax: Historical (11/18/2008)   Chol: 166 (10/04/2009)   HDL: 46.40 (10/04/2009)   LDL: 95 (10/04/2009)   TG: 122.0 (10/04/2009) TSH: 0.87 (10/04/2009)   PSA: 0.21 (10/04/2009) Next Colonoscopy due:: 09/2010 (08/26/2006)  Discussed using sunscreen, use of alcohol, drug use, self testicular exam, routine dental care, routine eye care, routine physical exam, seat belts, multiple vitamins, osteoporosis prevention, adequate calcium intake in diet, and recommendations for immunizations.   Discussed exercise and checking cholesterol.  Discussed gun safety, safe sex, and contraception. Also recommend checking PSA.  Complete Medication List: 1)  Lipitor 40 Mg Tabs (Atorvastatin calcium) .... One by mouth daily 2)  Aspirin 81 Mg Tabs (Aspirin) .... Take 1 tablet by mouth once a day 3)  Proscar 5 Mg Tabs (Finasteride) .... One by mouth daily 4)  Omeprazole 20 Mg Cpdr (Omeprazole) .... One by mouth daily 5)  Hydrocodone-acetaminophen 5-500 Mg Tabs (Hydrocodone-acetaminophen) .... One by mouth every 6-8 hours prn 6)  Fexofenadine-pseudoephedrine 60-120 Mg Xr12h-tab (Fexofenadine-pseudoephedrine) .... One by mouth two times a day 7)  Amlodipine Besy-benazepril Hcl 5-10 Mg Caps (Amlodipine besy-benazepril hcl) .... One by mouth daily 8)  Meloxicam 15 Mg Tabs (Meloxicam) .... One by mouth daily  Hypertension Assessment/Plan:      The patient's hypertensive risk group is category B: At least one risk factor (excluding diabetes) with no target organ damage.  His calculated 10 year risk of coronary heart disease is 7 %.  Today's blood pressure is 140/80.  His blood pressure goal is < 140/90.  Patient Instructions: 1)  Please schedule a follow-up appointment in 3 months. Prescriptions: HYDROCODONE-ACETAMINOPHEN 5-500 MG TABS (HYDROCODONE-ACETAMINOPHEN) one by mouth every 6-8 hours prn  #90 x 3   Entered and Authorized by:   Stacie Glaze MD  Signed by:   Stacie Glaze MD on 10/11/2009   Method used:   Print then Give to Patient   RxID:   [id number] LIPITOR 40 MG TABS (ATORVASTATIN CALCIUM) one by mouth daily  #30 x 11   Entered and Authorized by:   Stacie Glaze MD   Signed by:   Stacie Glaze MD on 10/11/2009   Method used:   Electronically to        [facility]* (retail)       [address] [place], [place]  [address]       Ph: [phone]       Fax: [phone]   RxID:   [id number]

## 2010-02-16 ENCOUNTER — Telehealth: Payer: Self-pay | Admitting: *Deleted

## 2010-02-16 NOTE — Assessment & Plan Note (Signed)
Summary: flu shot/njr   Nurse Visit   Allergies: No Known Drug Allergies  Immunizations Administered:  Influenza Vaccine # 1:    Vaccine Type: Fluvax 3+    Site: right deltoid    Mfr: [employer] Pasteur    Dose: 0.5 ml    Route: IM    Given by: Duard Brady LPN    Exp. Date: 07/15/2010    Lot #: [id number]    VIS given: 08/09/09 version given January 11, 2010.    Physician counseled: yes  Flu Vaccine Consent Questions:    Do you have a history of severe allergic reactions to this vaccine? no    Any prior history of allergic reactions to egg and/or gelatin? no    Do you have a sensitivity to the preservative Thimersol? no    Do you have a past history of Guillan-Barre Syndrome? no    Do you currently have an acute febrile illness? no    Have you ever had a severe reaction to latex? no    Vaccine information given and explained to patient? yes  Orders Added: 1)  Flu Vaccine 83yrs + [90658] 2)  Admin 1st Vaccine [62952]

## 2010-02-16 NOTE — Telephone Encounter (Signed)
Violent vomiting x 15 hours and had recent hernia surgery (inguinal) that was incarcerated.  No fever, and is having diarrhea.  Cannot any fluids down.  Needs advice and he will also call is Careers adviser.

## 2010-02-16 NOTE — Telephone Encounter (Signed)
Pt and wife called and told to go to [facility]

## 2010-02-16 NOTE — Progress Notes (Signed)
  Phone Note Call from Patient   Summary of Call: ? hernia in groin and cannot  get comfortable.  Would like to see Dr Lovell Sheehan this am ASAP. Scheduled this am.  Initial call taken by: Lynann Beaver CMA AAMA,  February 01, 2010 8:31 AM

## 2010-02-16 NOTE — Telephone Encounter (Signed)
Go to ER after surgery for incarceration this could be obstruction... This is an emergency

## 2010-02-16 NOTE — Assessment & Plan Note (Signed)
Summary: ing/hernia/dm   Vital Signs:  Patient profile:   60 year old male Height:      69 inches Weight:      218 pounds BMI:     32.31 Temp:     98.2 degrees F oral Pulse rate:   72 / minute Resp:     14 per minute BP sitting:   130 / 80  (left arm)  Vitals Entered By: Willy Eddy, LPN (February 01, 2010 12:40 PM) CC: rt inguinal hernia pain- difficulty walking an d asitting down Is Patient Diabetic? No   Primary Care Provider:  Stacie Glaze MD  CC:  rt inguinal hernia pain- difficulty walking an d asitting down.  History of Present Illness: The pt presents with a mass in his right scrotum.Marland KitchenMarland KitchenThe pt has pain and an evident mass He notes that the mass first appeared in Nov but reduced spontaneously.  The mass reccurred today and did not reduce. I cannot reduce it in the office due to pain. He states that he has had normal bowel movements up until today. he rates the pain as 8/10. He is on pain mendcation from chronic back pain.  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) 5)  Actinic Keratosis  (ICD-702.0) 6)  Preventive Health Care  (ICD-V70.0) 7)  Diverticulitis, Colon W/hem  (ICD-562.13) 8)  Advef, Drug/medicinal/biological Subst Nos  (ICD-995.20) 9)  Diverticulosis, Colon  (ICD-562.10) 10)  Colonic Polyps, Hx of  (ICD-V12.72) 11)  Family History Diabetes 1st Degree Relative  (ICD-V18.0) 12)  Family History of Melanoma  (ICD-V16.8) 13)  Benign Prostatic Hypertrophy  (ICD-600.00) 14)  Hypertension  (ICD-401.9) 15)  Hyperlipidemia  (ICD-272.4)  Current Problems (verified): 1)  Insomnia, Chronic  (ICD-307.42) 2)   Benign Prostatic Hypertrophy, With Obstruction  (ICD-600.01) 3)  Low Back Pain  (ICD-724.2) 4)  Neoplasm, Malignant, Skin, 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 One By Mouth Daily 2)  Aspirin 81 Mg  Tabs (Aspirin) .... Take 1 Tablet By Mouth Once A Day 3)  Proscar 5 Mg  Tabs (Finasteride) .... One By Mouth Daily 4)  Omeprazole 20 Mg  Cpdr (Omeprazole) .... One By Mouth Daily 5)  Hydrocodone-Acetaminophen 5-500 Mg Tabs (Hydrocodone-Acetaminophen) .... One By Mouth Every 6-8 Hours Prn 6)  Fexofenadine-Pseudoephedrine 60-120 Mg Xr12h-Tab (Fexofenadine-Pseudoephedrine) .... One By Mouth Two Times A Day 7)  Amlodipine Besy-Benazepril Hcl 5-10 Mg Caps (Amlodipine Besy-Benazepril Hcl) .... One By Mouth Daily 8)  Meloxicam 15 Mg Tabs (Meloxicam) .... One By Mouth Daily  Current Medications (verified): 1)  Lipitor 40 Mg Tabs (Atorvastatin Calcium) .Marland KitchenMarland KitchenMarland Kitchen  One By Mouth Daily 2)  Aspirin 81 Mg  Tabs (Aspirin) .... Take 1 Tablet By Mouth Once A Day 3)  Proscar 5 Mg  Tabs (Finasteride) .... One By Mouth Daily 4)  Omeprazole 20 Mg  Cpdr (Omeprazole) .... One By Mouth Daily 5)  Hydrocodone-Acetaminophen 5-500 Mg Tabs (Hydrocodone-Acetaminophen) .... One By Mouth Every 6-8 Hours Prn 6)  Fexofenadine-Pseudoephedrine 60-120 Mg Xr12h-Tab (Fexofenadine-Pseudoephedrine) .... One By Mouth Two Times A Day 7)  Amlodipine Besy-Benazepril Hcl 5-10 Mg Caps (Amlodipine  Besy-Benazepril Hcl) .... One By Mouth Daily 8)  Meloxicam 15 Mg Tabs (Meloxicam) .... One By Mouth Daily  Allergies (verified): No Known Drug Allergies  Past History:  Family History: Last updated: 08/19/2006 Family History of Melanoma Family History Other cancer-Bone,Lymph nodes Family History of Arthritis Family History Diabetes 1st degree relative  Social History: Last updated: 12/17/2006 Married Former Smoker  Risk Factors: Smoking Status: quit (02/01/2010) Packs/Day: 1.0 (02/01/2010) Passive Smoke Exposure: no (02/01/2010)  Past medical, surgical, family and social histories (including risk factors) reviewed, and no changes noted (except as noted below).  Past Medical History: Reviewed history from 12/17/2006 and no changes required. Hyperlipidemia Hypertension Benign prostatic hypertrophy Colonic polyps, hx of Diverticulosis, colon Low back pain  Past Surgical History: Reviewed history from 08/26/2006 and no changes required. ankle fx Colonoscopy-09/18/2005 polyps  Family History: Reviewed history from 08/19/2006 and no changes required. Family History of Melanoma Family History Other cancer-Bone,Lymph nodes Family History of Arthritis Family History Diabetes 1st degree relative  Social History: Reviewed history from 12/17/2006 and no changes required. Married Former Smoker  Review of Systems       larger right indirect hernia   Impression & Recommendations:  Problem # 1:  ING HERN W/OBST W/O MENTION GANGREN UNILAT/UNS (ICD-550.10) Assessment New  surgical inguinal hernia surgery referral now discussed finding with Dr Mignon Pine will send to office now with note  Orders: Surgical Referral (Surgery)  Problem # 2:  LOW BACK PAIN (ICD-724.2) Assessment: Unchanged  chronic stable on meds without recent increase His updated medication list for this problem includes:    Aspirin 81 Mg Tabs (Aspirin) .Marland Kitchen... Take 1 tablet by mouth once a day     Hydrocodone-acetaminophen 5-500 Mg Tabs (Hydrocodone-acetaminophen) ..... One by mouth every 6-8 hours prn    Meloxicam 15 Mg Tabs (Meloxicam) ..... One by mouth daily  Discussed use of moist heat or ice, modified activities, medications, and stretching/strengthening exercises. Back care instructions given. To be seen in 2 weeks if no improvement; sooner if worsening of symptoms.   His updated medication list for this problem includes:    Aspirin 81 Mg Tabs (Aspirin) .Marland Kitchen... Take 1 tablet by mouth once a day    Hydrocodone-acetaminophen 5-500 Mg Tabs (Hydrocodone-acetaminophen) ..... One by mouth every 6-8 hours prn    Meloxicam 15 Mg Tabs (Meloxicam) ..... One by mouth daily  Problem # 3:  HYPERTENSION (ICD-401.9) Assessment: Unchanged  His updated medication list for this problem includes:    Amlodipine Besy-benazepril Hcl 5-10 Mg Caps (Amlodipine besy-benazepril hcl) ..... One by mouth daily  BP today: 130/80 Prior BP: 140/80 (10/11/2009)  Prior 10 Yr Risk Heart Disease: 7 % (10/11/2009)  Labs Reviewed: K+: 4.2 (10/04/2009) Creat: : 0.8 (10/04/2009)   Chol: 166 (10/04/2009)   HDL: 46.40 (10/04/2009)   LDL: 95 (10/04/2009)   TG: 122.0 (10/04/2009)  His updated medication list for this problem includes:    Amlodipine Besy-benazepril Hcl 5-10 Mg Caps (Amlodipine besy-benazepril hcl) ..... One by mouth daily  Complete Medication List: 1)  Lipitor 40 Mg Tabs (Atorvastatin calcium) .... One by mouth daily 2)  Aspirin 81 Mg Tabs (Aspirin) .... Take 1 tablet by mouth once a day 3)  Proscar 5 Mg Tabs (Finasteride) .... One by mouth daily 4)  Omeprazole 20 Mg Cpdr (Omeprazole) .... One by mouth daily 5)  Hydrocodone-acetaminophen 5-500 Mg Tabs (Hydrocodone-acetaminophen) .... One by mouth every 6-8 hours prn 6)  Fexofenadine-pseudoephedrine 60-120 Mg Xr12h-tab (Fexofenadine-pseudoephedrine) .... One by mouth two times a day 7)  Amlodipine Besy-benazepril Hcl 5-10 Mg Caps (Amlodipine  besy-benazepril hcl) .... One by mouth daily 8)  Meloxicam 15 Mg Tabs (Meloxicam) .... One by mouth daily  Patient Instructions: 1)  go to surgeon now take note and give to dr cornett 2)  referral in process   Orders Added: 1)  Surgical Referral [Surgery] 2)  Est. Patient Level IV [16109]

## 2010-03-16 ENCOUNTER — Other Ambulatory Visit (INDEPENDENT_AMBULATORY_CARE_PROVIDER_SITE_OTHER): Payer: BC Managed Care – PPO | Admitting: Internal Medicine

## 2010-03-16 DIAGNOSIS — Z0000 Encounter for general adult medical examination without abnormal findings: Secondary | ICD-10-CM

## 2010-03-16 LAB — BASIC METABOLIC PANEL
GFR: 118.31 mL/min (ref >60.00)
Potassium: 3.9 mEq/L (ref 3.5–5.1)
Sodium: 137 mEq/L (ref 135–145)

## 2010-03-16 LAB — HEPATIC FUNCTION PANEL
ALT: 25 U/L (ref 0–53)
AST: 22 U/L (ref 0–37)
Albumin: 4.1 g/dL (ref 3.5–5.2)
Alkaline Phosphatase: 84 U/L (ref 39–117)

## 2010-03-16 LAB — CBC WITH DIFFERENTIAL/PLATELET
Eosinophils Relative: 2.5 % (ref 0.0–5.0)
HCT: 41.7 % (ref 39.0–52.0)
Hemoglobin: 14.4 g/dL (ref 13.0–17.0)
Lymphocytes Relative: 23.9 % (ref 12.0–46.0)
Lymphs Abs: 1.8 10*3/uL (ref 0.7–4.0)
Monocytes Relative: 6.9 % (ref 3.0–12.0)
Neutro Abs: 4.9 10*3/uL (ref 1.4–7.7)
RBC: 4.63 Mil/uL (ref 4.22–5.81)
WBC: 7.4 10*3/uL (ref 4.5–10.5)

## 2010-03-16 LAB — POCT URINALYSIS DIPSTICK
Glucose, UA: NEGATIVE
Nitrite, UA: NEGATIVE
Protein, UA: NEGATIVE
Spec Grav, UA: 1.02
Urobilinogen, UA: 0.2

## 2010-03-16 LAB — LIPID PANEL
Cholesterol: 168 mg/dL (ref 0–200)
LDL Cholesterol: 94 mg/dL (ref 0–99)

## 2010-03-16 LAB — PSA: PSA: 0.16 ng/mL (ref 0.10–4.00)

## 2010-03-16 LAB — TSH: TSH: 1.03 u[IU]/mL (ref 0.35–5.50)

## 2010-03-23 ENCOUNTER — Ambulatory Visit (INDEPENDENT_AMBULATORY_CARE_PROVIDER_SITE_OTHER): Payer: BC Managed Care – PPO | Admitting: Internal Medicine

## 2010-03-23 ENCOUNTER — Encounter: Payer: Self-pay | Admitting: Internal Medicine

## 2010-03-23 VITALS — BP 120/80 | HR 80 | Temp 98.9°F | Resp 14 | Ht 69.0 in | Wt 212.0 lb

## 2010-03-23 DIAGNOSIS — Z0000 Encounter for general adult medical examination without abnormal findings: Secondary | ICD-10-CM

## 2010-03-23 DIAGNOSIS — E785 Hyperlipidemia, unspecified: Secondary | ICD-10-CM

## 2010-03-23 DIAGNOSIS — M549 Dorsalgia, unspecified: Secondary | ICD-10-CM

## 2010-03-23 MED ORDER — ATORVASTATIN CALCIUM 40 MG PO TABS: 40.0000 mg | ORAL_TABLET | Freq: Every day | ORAL | Status: DC

## 2010-03-23 MED ORDER — HYDROCODONE-ACETAMINOPHEN 5-500 MG PO TABS: 1.0000 | ORAL_TABLET | Freq: Four times a day (QID) | ORAL | Status: DC | PRN

## 2010-03-23 NOTE — Progress Notes (Signed)
  Subjective:    Patient ID: Matthew Watson, male    DOB: Sep 16, 1950, 60 y.o.   MRN: [id number]  HPI Presents for a CPX He has no new complaints His chronic problems were reviewed and are stable    Review of Systems  Constitutional: Negative for fever and fatigue.  HENT: Negative for hearing loss, congestion, neck pain and postnasal drip.   Eyes: Negative for discharge, redness and visual disturbance.  Respiratory: Negative for cough, shortness of breath and wheezing.   Cardiovascular: Negative for leg swelling.  Gastrointestinal: Negative for abdominal pain, constipation and abdominal distention.  Genitourinary: Negative for urgency and frequency.  Musculoskeletal: Negative for joint swelling and arthralgias.  Skin: Negative for color change and rash.  Neurological: Negative for weakness and light-headedness.  Hematological: Negative for adenopathy.  Psychiatric/Behavioral: Negative for behavioral problems.   Past Medical History  Diagnosis Date  . Hyperlipidemia   . Hypertension   . BPH (benign prostatic hyperplasia)   . Colon polyps   . Diverticulosis   . Low back pain    Past Surgical History  Procedure Date  . Ankle fracture surgery   . Hernia repair     jan 2012, cornett    reports that he quit smoking about 20 years ago. He does not have any smokeless tobacco history on file. He reports that he drinks alcohol. He reports that he does not use illicit drugs. family history includes Cancer in his brother and father; Diabetes in his paternal grandfather; and Melanoma in an unspecified family member. No Known Allergies      Objective:   Physical Exam  Constitutional: He appears well-developed and well-nourished.  HENT:  Head: Normocephalic and atraumatic.  Eyes: Conjunctivae are normal. Pupils are equal, round, and reactive to light.  Neck: Normal range of motion. Neck supple.  Cardiovascular: Normal rate and regular rhythm.   Pulmonary/Chest: Effort normal  and breath sounds normal.  Abdominal: Soft. Bowel sounds are normal.  Musculoskeletal: He exhibits tenderness.       Chronic changes to right ankle  Neurological: He is alert.  Skin: Skin is warm and dry.  Psychiatric: He has a normal mood and affect. His behavior is normal.          Assessment & Plan:   Patient presents for yearly preventative medicine examination.   all immunizations and health maintenance protocols were reviewed with the patient and they are up to date with these protocols.   screening laboratory values were reviewed with the patient including screening of hyperlipidemia PSA renal function and hepatic function.   There medications past medical history social history problem list and allergies were reviewed in detail.   Goals were established with regard to weight loss exercise diet in compliance with medications

## 2010-06-05 ENCOUNTER — Telehealth: Payer: Self-pay | Admitting: *Deleted

## 2010-06-05 NOTE — Telephone Encounter (Signed)
Wife is calling because the patient is complaining  of fullness and swishing in his ear.  He would like to know if something can be called in?

## 2010-06-05 NOTE — Telephone Encounter (Signed)
Try allegra d for several day and if it doesn't help let us know

## 2010-06-06 MED ORDER — AMOXICILLIN-POT CLAVULANATE 875-125 MG PO TABS: 1.0000 | ORAL_TABLET | Freq: Two times a day (BID) | ORAL | Status: AC

## 2010-06-06 NOTE — Telephone Encounter (Signed)
Call patient for an exam that Augmentin has been sent to his pharmacy for 10 day course if the redness and swelling of the ear persists he should be referred to a near nose and throat doctor

## 2010-06-06 NOTE — Telephone Encounter (Signed)
Pt says ear is swollen, red, and is face is swollen and painful.

## 2010-06-08 ENCOUNTER — Telehealth: Payer: Self-pay | Admitting: *Deleted

## 2010-06-08 DIAGNOSIS — H9209 Otalgia, unspecified ear: Secondary | ICD-10-CM

## 2010-06-08 NOTE — Telephone Encounter (Signed)
Pt does not feel better and wants ENT referral.  Will send orders to [facility].

## 2010-07-07 ENCOUNTER — Other Ambulatory Visit: Payer: Self-pay | Admitting: Internal Medicine

## 2010-07-27 ENCOUNTER — Ambulatory Visit (INDEPENDENT_AMBULATORY_CARE_PROVIDER_SITE_OTHER): Payer: BC Managed Care – PPO | Admitting: Internal Medicine

## 2010-07-27 ENCOUNTER — Encounter: Payer: Self-pay | Admitting: Internal Medicine

## 2010-07-27 DIAGNOSIS — I10 Essential (primary) hypertension: Secondary | ICD-10-CM

## 2010-07-27 DIAGNOSIS — Z86010 Personal history of colonic polyps: Secondary | ICD-10-CM

## 2010-07-27 DIAGNOSIS — M549 Dorsalgia, unspecified: Secondary | ICD-10-CM

## 2010-07-27 DIAGNOSIS — N401 Enlarged prostate with lower urinary tract symptoms: Secondary | ICD-10-CM

## 2010-07-27 LAB — BASIC METABOLIC PANEL
BUN: 19 mg/dL (ref 6–23)
Calcium: 8.7 mg/dL (ref 8.4–10.5)
Creatinine, Ser: 0.9 mg/dL (ref 0.4–1.5)
GFR: 91.34 mL/min (ref >60.00)
Glucose, Bld: 94 mg/dL (ref 70–99)
Potassium: 3.8 mEq/L (ref 3.5–5.1)

## 2010-07-27 MED ORDER — HYDROCODONE-ACETAMINOPHEN 5-500 MG PO TABS: 1.0000 | ORAL_TABLET | Freq: Four times a day (QID) | ORAL | Status: AC | PRN

## 2010-07-27 NOTE — Assessment & Plan Note (Signed)
2-3 episodes of nocturia On proscar

## 2010-07-27 NOTE — Progress Notes (Signed)
  Subjective:    Patient ID: Matthew Watson, male    DOB: 1950-08-15, 60 y.o.   MRN: [id number]  HPI patient is a 60 year old white male followed for hypertension reflux esophagitis history of colon polyps with appended history of his family's history of colon polyps and colon cancer but I believe will result in a change in the frequency of his screening test to 7 years however will refer to gastroenterology for a opinion on this.      Review of Systems  Constitutional: Negative for fever and fatigue.  HENT: Negative for hearing loss, congestion, neck pain and postnasal drip.   Eyes: Negative for discharge, redness and visual disturbance.  Respiratory: Negative for cough, shortness of breath and wheezing.   Cardiovascular: Negative for leg swelling.  Gastrointestinal: Negative for abdominal pain, constipation and abdominal distention.  Genitourinary: Negative for urgency and frequency.  Musculoskeletal: Negative for joint swelling and arthralgias.  Skin: Negative for color change and rash.  Neurological: Negative for weakness and light-headedness.  Hematological: Negative for adenopathy.  Psychiatric/Behavioral: Negative for behavioral problems.   Past Medical History  Diagnosis Date  . Hyperlipidemia   . Hypertension   . BPH (benign prostatic hyperplasia)   . Colon polyps   . Diverticulosis   . Low back pain    Past Surgical History  Procedure Date  . Ankle fracture surgery   . Hernia repair     jan 2012, cornett    reports that he quit smoking about 20 years ago. He does not have any smokeless tobacco history on file. He reports that he drinks alcohol. He reports that he does not use illicit drugs. family history includes Cancer in his brother, father, paternal uncle, and unspecified family member; Colon polyps in his sister; and Melanoma in an unspecified family member. No Known Allergies     Objective:   Physical Exam  Nursing note and vitals  reviewed. Constitutional: He appears well-developed and well-nourished.  HENT:  Head: Normocephalic and atraumatic.  Eyes: Conjunctivae are normal. Pupils are equal, round, and reactive to light.  Neck: Normal range of motion. Neck supple.  Cardiovascular: Normal rate and regular rhythm.   Pulmonary/Chest: Effort normal and breath sounds normal.  Abdominal: Soft. Bowel sounds are normal.          Assessment & Plan:  Blood pressure was stable on current medications.  Benign prostatic hypertrophy is treated with post car he takes meloxicam for arthritis.  He is on Prilosec for GERD will refer to gastroenterology for a sooner colonoscopy due to change in family history and risk factors for colon cancer.  We opened and drained a blackhead on the mid back

## 2010-07-27 NOTE — Assessment & Plan Note (Signed)
Additional family history and that his older sister was found to have precancerous colon polyps and he has a history of colon cancer in an uncle and cousin.  I believe this changes the paradigm for his colon cancer screening and we'll refer him to GI he is currently on a 10 year call back

## 2010-08-03 ENCOUNTER — Encounter: Payer: Self-pay | Admitting: Gastroenterology

## 2010-09-06 ENCOUNTER — Ambulatory Visit (AMBULATORY_SURGERY_CENTER): Payer: BC Managed Care – PPO | Admitting: *Deleted

## 2010-09-06 ENCOUNTER — Encounter: Payer: Self-pay | Admitting: Gastroenterology

## 2010-09-06 VITALS — Ht 69.0 in | Wt 211.0 lb

## 2010-09-06 DIAGNOSIS — Z1211 Encounter for screening for malignant neoplasm of colon: Secondary | ICD-10-CM

## 2010-09-06 MED ORDER — SUPREP BOWEL PREP KIT 17.5-3.13-1.6 GM/177ML PO SOLN: 1.0000 | Freq: Once | ORAL | Status: DC

## 2010-09-11 ENCOUNTER — Telehealth: Payer: Self-pay | Admitting: *Deleted

## 2010-09-11 DIAGNOSIS — K5792 Diverticulitis of intestine, part unspecified, without perforation or abscess without bleeding: Secondary | ICD-10-CM

## 2010-09-11 MED ORDER — METRONIDAZOLE 500 MG PO TABS: 500.0000 mg | ORAL_TABLET | Freq: Two times a day (BID) | ORAL | Status: AC

## 2010-09-11 MED ORDER — CIPROFLOXACIN HCL 500 MG PO TABS: 500.0000 mg | ORAL_TABLET | Freq: Two times a day (BID) | ORAL | Status: AC

## 2010-09-11 NOTE — Telephone Encounter (Signed)
Per dr Lovell Sheehan may have cipro 500 bid and flagyl 500 bid for 10 days

## 2010-09-11 NOTE — Telephone Encounter (Signed)
Pt is having lower abd. Pain with nausea and feels it is a diverticulitis flare.  Did take a laxative and nausea is better.  Needs treatment called to [facility])

## 2010-09-11 NOTE — Telephone Encounter (Signed)
Notified pt. 

## 2010-09-15 ENCOUNTER — Telehealth: Payer: Self-pay | Admitting: Gastroenterology

## 2010-09-15 NOTE — Telephone Encounter (Signed)
Yes, we should put this out a month to make sure the active infection, inflamation is gone at time of colonoscopy

## 2010-09-15 NOTE — Telephone Encounter (Signed)
Colon rescheduled for 10/24/10.  Pt. Notified.  Wyona Almas

## 2010-09-15 NOTE — Telephone Encounter (Signed)
Matthew Watson called asking if he should still have his colon on 09/20/10.  He's  Been taking his Cipro and Flagyl for the diverticulitis flare-up since Monday.  He denies pain or discomfort but said" things are just not right yet" and wanted to be advised re: delaying his colon.  Please advise.  Wyona Almas

## 2010-09-16 DIAGNOSIS — K5790 Diverticulosis of intestine, part unspecified, without perforation or abscess without bleeding: Secondary | ICD-10-CM

## 2010-09-16 HISTORY — DX: Diverticulosis of intestine, part unspecified, without perforation or abscess without bleeding: K57.90

## 2010-09-20 ENCOUNTER — Other Ambulatory Visit: Payer: BC Managed Care – PPO | Admitting: Gastroenterology

## 2010-10-12 ENCOUNTER — Other Ambulatory Visit: Payer: Self-pay | Admitting: Internal Medicine

## 2010-10-24 ENCOUNTER — Other Ambulatory Visit: Payer: BC Managed Care – PPO | Admitting: Gastroenterology

## 2010-10-31 ENCOUNTER — Ambulatory Visit (AMBULATORY_SURGERY_CENTER): Payer: BC Managed Care – PPO | Admitting: Gastroenterology

## 2010-10-31 ENCOUNTER — Encounter: Payer: Self-pay | Admitting: Gastroenterology

## 2010-10-31 VITALS — BP 138/94 | HR 71 | Temp 98.3°F | Resp 20 | Ht 69.0 in | Wt 211.0 lb

## 2010-10-31 DIAGNOSIS — Z800 Family history of malignant neoplasm of digestive organs: Secondary | ICD-10-CM

## 2010-10-31 DIAGNOSIS — Z1211 Encounter for screening for malignant neoplasm of colon: Secondary | ICD-10-CM

## 2010-10-31 DIAGNOSIS — K5730 Diverticulosis of large intestine without perforation or abscess without bleeding: Secondary | ICD-10-CM

## 2010-10-31 DIAGNOSIS — D126 Benign neoplasm of colon, unspecified: Secondary | ICD-10-CM

## 2010-10-31 DIAGNOSIS — Z86010 Personal history of colonic polyps: Secondary | ICD-10-CM

## 2010-10-31 MED ORDER — SODIUM CHLORIDE 0.9 % IV SOLN: 500.0000 mL | INTRAVENOUS | Status: DC

## 2010-10-31 NOTE — Progress Notes (Signed)
Pt voiced no complaints on discharge. maw 

## 2010-10-31 NOTE — Patient Instructions (Signed)
See the picture page for your findings from your exam today.  Follow the green and blue discharge instruction sheets the rest of the day.  Resume your prior medications today. Please call if any questions or concerns.  

## 2010-11-01 ENCOUNTER — Telehealth: Payer: Self-pay

## 2010-11-01 NOTE — Telephone Encounter (Signed)

## 2010-12-05 ENCOUNTER — Telehealth: Payer: Self-pay | Admitting: *Deleted

## 2010-12-05 NOTE — Telephone Encounter (Signed)
Pt has a mole on back of head that is changing, and he wants it checked.  Benna Dunks thinks it looks supicious.

## 2010-12-05 NOTE — Telephone Encounter (Signed)
Please call and make appt  For an open appointment

## 2010-12-06 NOTE — Telephone Encounter (Signed)
Called pt and sch him for ov on 12/18/10 at 11:30am, as noted.

## 2010-12-11 ENCOUNTER — Other Ambulatory Visit: Payer: Self-pay | Admitting: Internal Medicine

## 2010-12-18 ENCOUNTER — Ambulatory Visit (INDEPENDENT_AMBULATORY_CARE_PROVIDER_SITE_OTHER): Payer: BC Managed Care – PPO | Admitting: Internal Medicine

## 2010-12-18 ENCOUNTER — Encounter: Payer: Self-pay | Admitting: Internal Medicine

## 2010-12-18 VITALS — BP 140/80 | HR 80 | Temp 98.2°F | Resp 16 | Ht 70.0 in | Wt 212.0 lb

## 2010-12-18 DIAGNOSIS — R0602 Shortness of breath: Secondary | ICD-10-CM

## 2010-12-18 DIAGNOSIS — D1801 Hemangioma of skin and subcutaneous tissue: Secondary | ICD-10-CM

## 2010-12-18 DIAGNOSIS — I10 Essential (primary) hypertension: Secondary | ICD-10-CM

## 2010-12-18 MED ORDER — AMLODIPINE BESY-BENAZEPRIL HCL 5-20 MG PO CAPS: 1.0000 | ORAL_CAPSULE | Freq: Every day | ORAL | Status: DC

## 2010-12-18 NOTE — Patient Instructions (Signed)
20 lbs weight loss will help the Shortness of breath

## 2010-12-18 NOTE — Progress Notes (Signed)
  Subjective:    Patient ID: Matthew Watson, male    DOB: [birth year], 60 y.o.   MRN: [id number]  HPI  Subjective shortness of breath with activity.  Blood pressure stable on current medications weight is approximately BMI of 30.  No evidence of COPD he stopped smoking in 1992 and has had no symptoms of COPD.  His blood pressure is controlled with amlodipine and enalapril but due to the weight I think we should increase that slightly from 510 to the 520 he is on finasteride for benign prostatic hypertrophy and Vicodin for back pain.  A handicap sticker was g and inability to walk greater than 100 yards without restingiven today as a permanent handicap sticker due to orthopedic issues and back pain  Review of Systems  Constitutional: Negative for fever and fatigue.  HENT: Negative for hearing loss, congestion, neck pain and postnasal drip.   Eyes: Negative for discharge, redness and visual disturbance.  Respiratory: Negative for cough, shortness of breath and wheezing.   Cardiovascular: Negative for leg swelling.  Gastrointestinal: Negative for abdominal pain, constipation and abdominal distention.  Genitourinary: Negative for urgency and frequency.  Musculoskeletal: Negative for joint swelling and arthralgias.  Skin: Negative for color change and rash.  Neurological: Negative for weakness and light-headedness.  Hematological: Negative for adenopathy.  Psychiatric/Behavioral: Negative for behavioral problems.       Objective:   Physical Exam  Nursing note and vitals reviewed. Constitutional: He is oriented to person, place, and time. He appears well-developed and well-nourished.  HENT:  Head: Normocephalic and atraumatic.  Eyes: Conjunctivae are normal. Pupils are equal, round, and reactive to light.  Neck: Normal range of motion. Neck supple.  Cardiovascular: Normal rate and regular rhythm.   Pulmonary/Chest: Effort normal and breath sounds normal. No respiratory distress. He  has no wheezes. He has no rales. He exhibits no tenderness.  Abdominal: Soft. Bowel sounds are normal. He exhibits distension.  Musculoskeletal: He exhibits no edema and no tenderness.       No edema in the left lower extremity 1+ edema in the right lower extremity with changes from trauma  Neurological: He is alert and oriented to person, place, and time.  Skin: Skin is warm and dry.          Assessment & Plan:  We will increase the Lotrel from 510-520 and monitor his blood pressure continues Lipitor 40 mg refilled hydrocodone as needed and complete a handicap sticker for the patient.  We examined the back of his scalp and there is a hemangioma approximately a centimeter across I would not recommend surgery for this due to the bleeding risk.

## 2011-01-05 ENCOUNTER — Other Ambulatory Visit: Payer: Self-pay | Admitting: Internal Medicine

## 2011-03-11 ENCOUNTER — Other Ambulatory Visit: Payer: Self-pay | Admitting: Internal Medicine

## 2011-03-13 ENCOUNTER — Other Ambulatory Visit: Payer: Self-pay | Admitting: Internal Medicine

## 2011-04-09 ENCOUNTER — Other Ambulatory Visit (INDEPENDENT_AMBULATORY_CARE_PROVIDER_SITE_OTHER): Payer: BC Managed Care – PPO

## 2011-04-09 DIAGNOSIS — Z0000 Encounter for general adult medical examination without abnormal findings: Secondary | ICD-10-CM

## 2011-04-09 LAB — CBC WITH DIFFERENTIAL/PLATELET
Basophils Relative: 0.7 % (ref 0.0–3.0)
Eosinophils Absolute: 0.2 10*3/uL (ref 0.0–0.7)
Eosinophils Relative: 2.6 % (ref 0.0–5.0)
Hemoglobin: 14.6 g/dL (ref 13.0–17.0)
Lymphocytes Relative: 23.4 % (ref 12.0–46.0)
MCHC: 33.2 g/dL (ref 30.0–36.0)
MCV: 89.2 fl (ref 78.0–100.0)
Neutro Abs: 4.6 10*3/uL (ref 1.4–7.7)
RBC: 4.93 Mil/uL (ref 4.22–5.81)
WBC: 6.9 10*3/uL (ref 4.5–10.5)

## 2011-04-09 LAB — LIPID PANEL
HDL: 47.9 mg/dL (ref >39.00)
Total CHOL/HDL Ratio: 3

## 2011-04-09 LAB — POCT URINALYSIS DIPSTICK
Glucose, UA: NEGATIVE
Leukocytes, UA: NEGATIVE
Nitrite, UA: NEGATIVE
Protein, UA: NEGATIVE
Spec Grav, UA: 1.02
Urobilinogen, UA: 0.2

## 2011-04-09 LAB — BASIC METABOLIC PANEL
CO2: 25 mEq/L (ref 19–32)
Chloride: 106 mEq/L (ref 96–112)
Sodium: 140 mEq/L (ref 135–145)

## 2011-04-09 LAB — PSA: PSA: 0.22 ng/mL (ref 0.10–4.00)

## 2011-04-09 LAB — HEPATIC FUNCTION PANEL
ALT: 25 U/L (ref 0–53)
Albumin: 3.9 g/dL (ref 3.5–5.2)
Alkaline Phosphatase: 91 U/L (ref 39–117)
Bilirubin, Direct: 0.1 mg/dL (ref 0.0–0.3)
Total Protein: 6.9 g/dL (ref 6.0–8.3)

## 2011-04-09 LAB — TSH: TSH: 1.22 u[IU]/mL (ref 0.35–5.50)

## 2011-04-11 ENCOUNTER — Telehealth: Payer: Self-pay | Admitting: *Deleted

## 2011-04-11 MED ORDER — FLUTICASONE PROPIONATE 50 MCG/ACT NA SUSP: 2.0000 | Freq: Every day | NASAL | Status: DC

## 2011-04-11 NOTE — Telephone Encounter (Signed)
Try fluticasone ns - 2 puffs each nostril qd.

## 2011-04-11 NOTE — Telephone Encounter (Signed)
LMTCB

## 2011-04-11 NOTE — Telephone Encounter (Signed)
Pt is having a really difficult time with his allergies, eyes itching, congestion nasal passages, ears completely stopped up and non productive cough.  Allegra and other OTC drugs not helping.

## 2011-04-11 NOTE — Telephone Encounter (Signed)
Notified pt. To pick up meds.

## 2011-04-16 ENCOUNTER — Encounter: Payer: BC Managed Care – PPO | Admitting: Internal Medicine

## 2011-07-04 ENCOUNTER — Other Ambulatory Visit: Payer: Self-pay | Admitting: Internal Medicine

## 2011-07-13 ENCOUNTER — Ambulatory Visit (INDEPENDENT_AMBULATORY_CARE_PROVIDER_SITE_OTHER): Payer: BC Managed Care – PPO | Admitting: Internal Medicine

## 2011-07-13 ENCOUNTER — Other Ambulatory Visit: Payer: Self-pay | Admitting: *Deleted

## 2011-07-13 ENCOUNTER — Encounter: Payer: Self-pay | Admitting: Internal Medicine

## 2011-07-13 VITALS — BP 136/76 | HR 72 | Temp 98.3°F | Resp 16 | Ht 69.0 in | Wt 216.0 lb

## 2011-07-13 DIAGNOSIS — M545 Low back pain: Secondary | ICD-10-CM

## 2011-07-13 DIAGNOSIS — I10 Essential (primary) hypertension: Secondary | ICD-10-CM

## 2011-07-13 DIAGNOSIS — Z0000 Encounter for general adult medical examination without abnormal findings: Secondary | ICD-10-CM

## 2011-07-13 DIAGNOSIS — E785 Hyperlipidemia, unspecified: Secondary | ICD-10-CM

## 2011-07-13 MED ORDER — HYDROCODONE-ACETAMINOPHEN 5-500 MG PO TABS: 1.0000 | ORAL_TABLET | ORAL | Status: DC | PRN

## 2011-07-13 NOTE — Progress Notes (Signed)
Subjective:    Patient ID: Matthew Watson, male    DOB: Jan 02, 1951, 61 y.o.   MRN: [id number]  HPI The pt has stable his medication and is doing quite   Review of Systems  Constitutional: Negative for fever and fatigue.  HENT: Negative for hearing loss, congestion, neck pain and postnasal drip.   Eyes: Negative for discharge, redness and visual disturbance.  Respiratory: Negative for cough, shortness of breath and wheezing.   Cardiovascular: Negative for leg swelling.  Gastrointestinal: Negative for abdominal pain, constipation and abdominal distention.  Genitourinary: Negative for urgency and frequency.  Musculoskeletal: Negative for joint swelling and arthralgias.  Skin: Negative for color change and rash.  Neurological: Negative for weakness and light-headedness.  Hematological: Negative for adenopathy.  Psychiatric/Behavioral: Negative for behavioral problems.   Past Medical History  Diagnosis Date  . Hyperlipidemia   . Hypertension   . BPH (benign prostatic hyperplasia)   . Adenomatous polyp of colon   . Diverticulosis 09/2010    episode of diverticulitis  . Low back pain   . Allergy     seasonal  . Arthritis   . GERD (gastroesophageal reflux disease)     History   Social History  . Marital Status: Married    Spouse Name: N/A    Number of Children: N/A  . Years of Education: N/A   Occupational History  . Not on file.   Social History Main Topics  . Smoking status: Former Smoker    Types: Cigarettes    Quit date: 03/23/1990  . Smokeless tobacco: Never Used   Comment: quit in 1992  . Alcohol Use: 3.0 oz/week    2 Glasses of wine, 3 Cans of beer per week  . Drug Use: No  . Sexually Active: Yes   Other Topics Concern  . Not on file   Social History Narrative  . No narrative on file    Past Surgical History  Procedure Date  . Ankle fracture surgery   . Hernia repair     jan 2012, [facility]  . Colonoscopy     Family History  Problem Relation  Age of Onset  . Melanoma    . Cancer    . Colon cancer    . Cancer Father     melanoma  . Cancer Brother     melanoma  . Colon polyps Sister   . Cancer Paternal Uncle     Colon cancer  . Colon cancer Paternal Uncle     No Known Allergies  Current Outpatient Prescriptions on File Prior to Visit  Medication Sig Dispense Refill  . amLODipine-benazepril (LOTREL) 5-20 MG per capsule Take 1 capsule by mouth daily.  90 capsule  3  . aspirin 81 MG tablet Take 81 mg by mouth daily.        Marland Kitchen atorvastatin (LIPITOR) 40 MG tablet TAKE 1 TABLET DAILY  90 tablet  2  . fexofenadine-pseudoephedrine (ALLEGRA-D) 60-120 MG per tablet Take 1 tablet by mouth 2 (two) times daily as needed. Seasonal allergies      . finasteride (PROSCAR) 5 MG tablet TAKE 1 TABLET DAILY  3 tablet  2  . fluticasone (FLONASE) 50 MCG/ACT nasal spray Place 2 sprays into the nose daily.  16 g  3  . HYDROcodone-acetaminophen (VICODIN) 5-500 MG per tablet Take 1 tablet by mouth as needed. pain      . meloxicam (MOBIC) 15 MG tablet TAKE 1 TABLET DAILY  90 tablet  0  . omeprazole (PRILOSEC)  20 MG capsule TAKE 1 CAPSULE DAILY  90 capsule  3    BP 136/76  Pulse 72  Temp 98.3 F (36.8 C)  Resp 16  Ht 5\' 9"  (1.753 m)  Wt 216 lb (97.977 kg)  BMI 31.90 kg/m2        Objective:   Physical Exam  Nursing note and vitals reviewed. Constitutional: He is oriented to person, place, and time. He appears well-developed and well-nourished.  HENT:  Head: Normocephalic and atraumatic.  Eyes: Conjunctivae are normal. Pupils are equal, round, and reactive to light.  Neck: Normal range of motion. Neck supple.  Cardiovascular: Normal rate and regular rhythm.   Pulmonary/Chest: Effort normal and breath sounds normal.  Abdominal: Soft. Bowel sounds are normal.  Genitourinary: Rectum normal and prostate normal.  Musculoskeletal: Normal range of motion.  Neurological: He is alert and oriented to person, place, and time.  Skin: Skin is  warm and dry.          Assessment & Plan:   Patient presents for yearly preventative medicine examination.   all immunizations and health maintenance protocols were reviewed with the patient and they are up to date with these protocols.   screening laboratory values were reviewed with the patient including screening of hyperlipidemia PSA renal function and hepatic function.   There medications past medical history social history problem list and allergies were reviewed in detail.   Goals were established with regard to weight loss exercise diet in compliance with medications

## 2011-09-09 ENCOUNTER — Other Ambulatory Visit: Payer: Self-pay | Admitting: Internal Medicine

## 2011-11-15 ENCOUNTER — Other Ambulatory Visit: Payer: Self-pay | Admitting: Internal Medicine

## 2011-11-20 ENCOUNTER — Other Ambulatory Visit: Payer: Self-pay | Admitting: Internal Medicine

## 2011-11-21 ENCOUNTER — Ambulatory Visit (INDEPENDENT_AMBULATORY_CARE_PROVIDER_SITE_OTHER): Payer: BC Managed Care – PPO | Admitting: Internal Medicine

## 2011-11-21 DIAGNOSIS — Z23 Encounter for immunization: Secondary | ICD-10-CM

## 2011-12-14 ENCOUNTER — Telehealth: Payer: Self-pay | Admitting: Internal Medicine

## 2011-12-14 DIAGNOSIS — I10 Essential (primary) hypertension: Secondary | ICD-10-CM

## 2011-12-14 DIAGNOSIS — D1801 Hemangioma of skin and subcutaneous tissue: Secondary | ICD-10-CM

## 2011-12-14 DIAGNOSIS — R0602 Shortness of breath: Secondary | ICD-10-CM

## 2011-12-14 MED ORDER — AMLODIPINE BESY-BENAZEPRIL HCL 5-20 MG PO CAPS: 1.0000 | ORAL_CAPSULE | Freq: Every day | ORAL | Status: DC

## 2011-12-14 NOTE — Telephone Encounter (Addendum)
Pt left bp med in hotel in [place]. Pt needs new rx amlodipine-benazepril 5-20mg  call [facility] [place] [phone]. Pt is out

## 2012-01-11 ENCOUNTER — Ambulatory Visit: Payer: BC Managed Care – PPO | Admitting: Internal Medicine

## 2012-01-20 ENCOUNTER — Other Ambulatory Visit: Payer: Self-pay | Admitting: Internal Medicine

## 2012-01-21 ENCOUNTER — Encounter: Payer: Self-pay | Admitting: Internal Medicine

## 2012-01-21 ENCOUNTER — Ambulatory Visit (INDEPENDENT_AMBULATORY_CARE_PROVIDER_SITE_OTHER): Payer: BC Managed Care – PPO | Admitting: Internal Medicine

## 2012-01-21 VITALS — BP 136/80 | HR 72 | Temp 98.2°F | Resp 16 | Ht 69.0 in | Wt 228.0 lb

## 2012-01-21 DIAGNOSIS — M79609 Pain in unspecified limb: Secondary | ICD-10-CM

## 2012-01-21 DIAGNOSIS — M79606 Pain in leg, unspecified: Secondary | ICD-10-CM

## 2012-01-21 MED ORDER — HYDROCODONE-ACETAMINOPHEN 5-500 MG PO TABS: 1.0000 | ORAL_TABLET | ORAL | Status: DC | PRN

## 2012-01-21 NOTE — Patient Instructions (Signed)
Cold steam humidifier

## 2012-01-21 NOTE — Progress Notes (Signed)
Subjective:    Patient ID: Matthew Watson, male    DOB: 1950-07-12, 62 y.o.   MRN: [id number]  HPI Has gained 10 lbs blood pressure stable Statin use for lipitor Back stable Using the cofor leg and needs refill    Review of Systems  Constitutional: Negative for fever and fatigue.  HENT: Negative for hearing loss, congestion, neck pain and postnasal drip.   Eyes: Negative for discharge, redness and visual disturbance.  Respiratory: Negative for cough, shortness of breath and wheezing.   Cardiovascular: Negative for leg swelling.  Gastrointestinal: Negative for abdominal pain, constipation and abdominal distention.  Genitourinary: Negative for urgency and frequency.  Musculoskeletal: Negative for joint swelling and arthralgias.  Skin: Negative for color change and rash.  Neurological: Negative for weakness and light-headedness.  Hematological: Negative for adenopathy.  Psychiatric/Behavioral: Negative for behavioral problems.   Past Medical History  Diagnosis Date  . Hyperlipidemia   . Hypertension   . BPH (benign prostatic hyperplasia)   . Adenomatous polyp of colon   . Diverticulosis 09/2010    episode of diverticulitis  . Low back pain   . Allergy     seasonal  . Arthritis   . GERD (gastroesophageal reflux disease)     History   Social History  . Marital Status: Married    Spouse Name: N/A    Number of Children: N/A  . Years of Education: N/A   Occupational History  . Not on file.   Social History Main Topics  . Smoking status: Former Smoker    Types: Cigarettes    Quit date: 03/23/1990  . Smokeless tobacco: Never Used     Comment: quit in 1992  . Alcohol Use: 3.0 oz/week    2 Glasses of wine, 3 Cans of beer per week  . Drug Use: No  . Sexually Active: Yes   Other Topics Concern  . Not on file   Social History Narrative  . No narrative on file    Past Surgical History  Procedure Date  . Ankle fracture surgery   . Hernia repair     jan  2012, [facility]  . Colonoscopy     Family History  Problem Relation Age of Onset  . Melanoma    . Cancer    . Colon cancer    . Cancer Father     melanoma  . Cancer Brother     melanoma  . Colon polyps Sister   . Cancer Paternal Uncle     Colon cancer  . Colon cancer Paternal Uncle     No Known Allergies  Current Outpatient Prescriptions on File Prior to Visit  Medication Sig Dispense Refill  . amLODipine-benazepril (LOTREL) 5-20 MG per capsule Take 1 capsule by mouth daily.  90 capsule  0  . aspirin 81 MG tablet Take 81 mg by mouth daily.        Marland Kitchen atorvastatin (LIPITOR) 40 MG tablet TAKE 1 TABLET DAILY  90 tablet  1  . fexofenadine-pseudoephedrine (ALLEGRA-D) 60-120 MG per tablet Take 1 tablet by mouth 2 (two) times daily as needed. Seasonal allergies      . finasteride (PROSCAR) 5 MG tablet TAKE 1 TABLET DAILY  90 tablet  1  . fluticasone (FLONASE) 50 MCG/ACT nasal spray Place 2 sprays into the nose daily.  16 g  3  . HYDROcodone-acetaminophen (VICODIN) 5-500 MG per tablet Take 1 tablet by mouth as needed. pain  30 tablet  3  . meloxicam (MOBIC) 15 MG  tablet TAKE 1 TABLET DAILY  90 tablet  1  . omeprazole (PRILOSEC) 20 MG capsule TAKE 1 CAPSULE DAILY  90 capsule  2    BP 136/80  Pulse 72  Temp 98.2 F (36.8 C)  Resp 16  Ht 5\' 9"  (1.753 m)  Wt 228 lb (103.42 kg)  BMI 33.67 kg/m2       Objective:   Physical Exam  Constitutional: He appears well-developed and well-nourished.  HENT:  Head: Normocephalic and atraumatic.  Eyes: Conjunctivae normal are normal. Pupils are equal, round, and reactive to light.  Neck: Normal range of motion. Neck supple.  Cardiovascular: Normal rate and regular rhythm.   Pulmonary/Chest: Effort normal and breath sounds normal.  Abdominal: Soft. Bowel sounds are normal.          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

## 2012-02-19 ENCOUNTER — Other Ambulatory Visit: Payer: Self-pay | Admitting: Internal Medicine

## 2012-03-07 ENCOUNTER — Other Ambulatory Visit: Payer: Self-pay | Admitting: Internal Medicine

## 2012-07-06 ENCOUNTER — Other Ambulatory Visit: Payer: Self-pay | Admitting: Internal Medicine

## 2012-07-07 ENCOUNTER — Other Ambulatory Visit (INDEPENDENT_AMBULATORY_CARE_PROVIDER_SITE_OTHER): Payer: BC Managed Care – PPO

## 2012-07-07 DIAGNOSIS — Z0000 Encounter for general adult medical examination without abnormal findings: Secondary | ICD-10-CM

## 2012-07-07 LAB — POCT URINALYSIS DIPSTICK
Bilirubin, UA: NEGATIVE
Glucose, UA: NEGATIVE
Ketones, UA: NEGATIVE
Spec Grav, UA: 1.025

## 2012-07-07 LAB — CBC WITH DIFFERENTIAL/PLATELET
Basophils Absolute: 0 10*3/uL (ref 0.0–0.1)
Eosinophils Relative: 2.3 % (ref 0.0–5.0)
HCT: 44 % (ref 39.0–52.0)
Hemoglobin: 14.9 g/dL (ref 13.0–17.0)
Lymphocytes Relative: 18.8 % (ref 12.0–46.0)
Lymphs Abs: 1.6 10*3/uL (ref 0.7–4.0)
Monocytes Relative: 7.6 % (ref 3.0–12.0)
Neutro Abs: 6.1 10*3/uL (ref 1.4–7.7)
Platelets: 206 10*3/uL (ref 150.0–400.0)
RDW: 13 % (ref 11.5–14.6)
WBC: 8.7 10*3/uL (ref 4.5–10.5)

## 2012-07-07 LAB — HEPATIC FUNCTION PANEL
ALT: 25 U/L (ref 0–53)
AST: 20 U/L (ref 0–37)
Alkaline Phosphatase: 88 U/L (ref 39–117)
Total Bilirubin: 1 mg/dL (ref 0.3–1.2)

## 2012-07-07 LAB — LIPID PANEL
Cholesterol: 162 mg/dL (ref 0–200)
HDL: 46.1 mg/dL (ref >39.00)
LDL Cholesterol: 92 mg/dL (ref 0–99)
Total CHOL/HDL Ratio: 4
Triglycerides: 120 mg/dL (ref 0.0–149.0)
VLDL: 24 mg/dL (ref 0.0–40.0)

## 2012-07-07 LAB — BASIC METABOLIC PANEL
Calcium: 9.2 mg/dL (ref 8.4–10.5)
GFR: 105.49 mL/min (ref >60.00)
Glucose, Bld: 103 mg/dL — ABNORMAL HIGH (ref 70–99)
Potassium: 4.3 mEq/L (ref 3.5–5.1)
Sodium: 142 mEq/L (ref 135–145)

## 2012-07-14 ENCOUNTER — Ambulatory Visit (INDEPENDENT_AMBULATORY_CARE_PROVIDER_SITE_OTHER): Payer: BC Managed Care – PPO | Admitting: Internal Medicine

## 2012-07-14 ENCOUNTER — Encounter: Payer: Self-pay | Admitting: Internal Medicine

## 2012-07-14 VITALS — BP 130/80 | HR 72 | Temp 98.6°F | Resp 16 | Ht 69.0 in | Wt 214.0 lb

## 2012-07-14 DIAGNOSIS — Z0000 Encounter for general adult medical examination without abnormal findings: Secondary | ICD-10-CM

## 2012-07-14 DIAGNOSIS — Z23 Encounter for immunization: Secondary | ICD-10-CM

## 2012-07-14 DIAGNOSIS — K5732 Diverticulitis of large intestine without perforation or abscess without bleeding: Secondary | ICD-10-CM

## 2012-07-14 DIAGNOSIS — Z2911 Encounter for prophylactic immunotherapy for respiratory syncytial virus (RSV): Secondary | ICD-10-CM

## 2012-07-14 MED ORDER — METRONIDAZOLE 500 MG PO TABS: 500.0000 mg | ORAL_TABLET | Freq: Two times a day (BID) | ORAL | Status: DC

## 2012-07-14 MED ORDER — CIPROFLOXACIN HCL 500 MG PO TABS: 500.0000 mg | ORAL_TABLET | Freq: Two times a day (BID) | ORAL | Status: DC

## 2012-07-14 NOTE — Progress Notes (Signed)
Subjective:    Patient ID: Matthew Watson, male    DOB: Apr 12, 1950, 62 y.o.   MRN: [id number]  HPIweight loss Cut out bread and pasta and cokes CPX    Review of Systems  Constitutional: Negative for fever and fatigue.  HENT: Negative for hearing loss, congestion, neck pain and postnasal drip.   Eyes: Negative for discharge, redness and visual disturbance.  Respiratory: Negative for cough, shortness of breath and wheezing.   Cardiovascular: Negative for leg swelling.  Gastrointestinal: Negative for abdominal pain, constipation and abdominal distention.  Genitourinary: Negative for urgency and frequency.  Musculoskeletal: Negative for joint swelling and arthralgias.  Skin: Negative for color change and rash.  Neurological: Negative for weakness and light-headedness.  Hematological: Negative for adenopathy.  Psychiatric/Behavioral: Negative for behavioral problems.   Past Medical History  Diagnosis Date  . Hyperlipidemia   . Hypertension   . BPH (benign prostatic hyperplasia)   . Adenomatous polyp of colon   . Diverticulosis 09/2010    episode of diverticulitis  . Low back pain   . Allergy     seasonal  . Arthritis   . GERD (gastroesophageal reflux disease)     History   Social History  . Marital Status: Married    Spouse Name: N/A    Number of Children: N/A  . Years of Education: N/A   Occupational History  . Not on file.   Social History Main Topics  . Smoking status: Former Smoker    Types: Cigarettes    Quit date: 03/23/1990  . Smokeless tobacco: Never Used     Comment: quit in 1992  . Alcohol Use: 3.0 oz/week    2 Glasses of wine, 3 Cans of beer per week  . Drug Use: No  . Sexually Active: Yes   Other Topics Concern  . Not on file   Social History Narrative  . No narrative on file    Past Surgical History  Procedure Laterality Date  . Ankle fracture surgery    . Hernia repair      jan 2012, [facility]  . Colonoscopy      Family History   Problem Relation Age of Onset  . Melanoma    . Cancer    . Colon cancer    . Cancer Father     melanoma  . Cancer Brother     melanoma  . Colon polyps Sister   . Cancer Paternal Uncle     Colon cancer  . Colon cancer Paternal Uncle     No Known Allergies  Current Outpatient Prescriptions on File Prior to Visit  Medication Sig Dispense Refill  . amLODipine-benazepril (LOTREL) 5-20 MG per capsule Take 1 capsule by mouth daily.  90 capsule  0  . aspirin 81 MG tablet Take 81 mg by mouth daily.        Marland Kitchen atorvastatin (LIPITOR) 40 MG tablet TAKE 1 TABLET DAILY  90 tablet  1  . fexofenadine-pseudoephedrine (ALLEGRA-D) 60-120 MG per tablet Take 1 tablet by mouth 2 (two) times daily as needed. Seasonal allergies      . finasteride (PROSCAR) 5 MG tablet TAKE 1 TABLET DAILY  90 tablet  1  . HYDROcodone-acetaminophen (VICODIN) 5-500 MG per tablet Take 1 tablet by mouth as needed. pain  60 tablet  3  . meloxicam (MOBIC) 15 MG tablet TAKE 1 TABLET DAILY  90 tablet  1  . omeprazole (PRILOSEC) 20 MG capsule TAKE 1 CAPSULE DAILY  90 capsule  2  No current facility-administered medications on file prior to visit.    BP 130/80  Pulse 72  Temp(Src) 98.6 F (37 C)  Resp 16  Ht 5\' 9"  (1.753 m)  Wt 214 lb (97.07 kg)  BMI 31.59 kg/m2       Objective:   Physical Exam  Nursing note and vitals reviewed. Constitutional: He is oriented to person, place, and time. He appears well-developed and well-nourished.  HENT:  Head: Normocephalic and atraumatic.  Eyes: Conjunctivae are normal. Pupils are equal, round, and reactive to light.  Neck: Normal range of motion. Neck supple.  Cardiovascular: Normal rate and regular rhythm.   Pulmonary/Chest: Effort normal and breath sounds normal.  Abdominal: Soft. Bowel sounds are normal.  Genitourinary: Rectum normal and prostate normal.  Musculoskeletal: Normal range of motion.  Neurological: He is alert and oriented to person, place, and time.  Skin:  Skin is warm and dry.  Psychiatric: He has a normal mood and affect. His behavior is normal.          Assessment & Plan:   Patient presents for yearly preventative medicine examination.   all immunizations and health maintenance protocols were reviewed with the patient and they are up to date with these protocols.   screening laboratory values were reviewed with the patient including screening of hyperlipidemia PSA renal function and hepatic function.   There medications past medical history social history problem list and allergies were reviewed in detail.   Goals were established with regard to weight loss exercise diet in compliance with medications

## 2012-07-14 NOTE — Patient Instructions (Signed)
The patient is instructed to continue all medications as prescribed. Schedule followup with check out clerk upon leaving the clinic  

## 2012-07-14 NOTE — Addendum Note (Signed)
Addended by: Willy Eddy on: 07/14/2012 05:29 PM   Modules accepted: Orders

## 2012-07-26 ENCOUNTER — Ambulatory Visit (INDEPENDENT_AMBULATORY_CARE_PROVIDER_SITE_OTHER): Payer: BC Managed Care – PPO | Admitting: Physician Assistant

## 2012-07-26 VITALS — BP 145/85 | HR 90 | Temp 101.8°F | Resp 16 | Ht 68.5 in | Wt 208.4 lb

## 2012-07-26 DIAGNOSIS — R6883 Chills (without fever): Secondary | ICD-10-CM

## 2012-07-26 DIAGNOSIS — R509 Fever, unspecified: Secondary | ICD-10-CM

## 2012-07-26 DIAGNOSIS — R0982 Postnasal drip: Secondary | ICD-10-CM

## 2012-07-26 DIAGNOSIS — J329 Chronic sinusitis, unspecified: Secondary | ICD-10-CM

## 2012-07-26 DIAGNOSIS — IMO0001 Reserved for inherently not codable concepts without codable children: Secondary | ICD-10-CM

## 2012-07-26 DIAGNOSIS — M791 Myalgia, unspecified site: Secondary | ICD-10-CM

## 2012-07-26 LAB — POCT CBC
MCH, POC: 30.3 pg (ref 27–31.2)
MCV: 92.5 fL (ref 80–97)
MID (cbc): 0.5 (ref 0–0.9)
MPV: 8.9 fL (ref 0–99.8)
POC LYMPH PERCENT: 14.3 %L (ref 10–50)
POC MID %: 6.3 %M (ref 0–12)
Platelet Count, POC: 176 10*3/uL (ref 142–424)
RBC: 5.09 M/uL (ref 4.69–6.13)
RDW, POC: 13.5 %
WBC: 8.1 10*3/uL (ref 4.6–10.2)

## 2012-07-26 MED ORDER — DOXYCYCLINE HYCLATE 100 MG PO CAPS: 100.0000 mg | ORAL_CAPSULE | Freq: Two times a day (BID) | ORAL | Status: DC

## 2012-07-26 MED ORDER — IPRATROPIUM BROMIDE 0.03 % NA SOLN: 2.0000 | Freq: Two times a day (BID) | NASAL | Status: DC

## 2012-07-26 NOTE — Progress Notes (Signed)
Subjective:    Patient ID: Matthew Watson, male    DOB: 15-Dec-1950, 62 y.o.   MRN: [id number]  HPI    Matthew Watson is a very pleasant 62 yr old male here with concern for illness.  Thinks perhaps he has a sinus infection.  Four days ago he got sick to his stomach.  Proceeded to have chills and hot flashes the rest of the night.  Began aching all over.  Has continued to has fever, chills, and aches since then.  He denies any abd pain.  No further vomiting but has felt nauseous.  Notes post-nasal drainage which he thinks has been contributing to his nausea.  Fever to 102F, took Tylenol here about 2.5 hours ago.  Notes nasal congestion but denies facial pain, ear pain, or sore throat. . No HA, cough, or SOB.  Appetite has been down.  Bowel movements have bene normal.  No urinary symptoms.  No rash.  Does not think he has had any tick bites but [place].  Does states that about 2 weeks ago he noted a tick crawling on his neck but did not think it had bit into him.  Also about that time he noted some sort of lesion on the back of his neck - not sure what it was, now scabbed over.  Has been applying retin-A to the area which seems to be helping.  Admits this could have been a tick bite but he's not sure.  No recent travel.  No sick contacts.     Review of Systems  Constitutional: Positive for fever, chills, appetite change and fatigue.  HENT: Positive for congestion and postnasal drip. Negative for ear pain, sore throat and rhinorrhea.   Respiratory: Negative for cough, shortness of breath and wheezing.   Cardiovascular: Negative.   Gastrointestinal: Positive for nausea and vomiting (x1). Negative for abdominal pain, diarrhea, constipation and abdominal distention.  Genitourinary: Negative.   Musculoskeletal: Positive for myalgias and arthralgias.  Skin: Negative.   Neurological: Negative for headaches.       Objective:   Physical Exam  Vitals  reviewed. Constitutional: He is oriented to person, place, and time. He appears well-developed and well-nourished. No distress.  HENT:  Head: Normocephalic and atraumatic.  Right Ear: Tympanic membrane and ear canal normal.  Left Ear: Tympanic membrane and ear canal normal.  Nose: Mucosal edema and rhinorrhea present. Right sinus exhibits no maxillary sinus tenderness and no frontal sinus tenderness. Left sinus exhibits no maxillary sinus tenderness and no frontal sinus tenderness.  Mouth/Throat: Uvula is midline, oropharynx is clear and moist and mucous membranes are normal.  Post-nasal drainage visible in posterior pharynx  Eyes: Conjunctivae are normal. No scleral icterus.  Neck: Neck supple.  Cardiovascular: Normal rate, regular rhythm and normal heart sounds.   Pulmonary/Chest: Effort normal and breath sounds normal. He has no wheezes. He has no rales.  Abdominal: Soft. Bowel sounds are normal. There is no tenderness.  Lymphadenopathy:    He has no cervical adenopathy.  Neurological: He is alert and oriented to person, place, and time.  Skin: Skin is warm. He is diaphoretic.     Scabbed lesion at back of neck; non-tender; minimal surrounding erythema; no warmth, induration, fluctuance, or drainage  Psychiatric: He has a normal mood and affect. His behavior is normal.     Results for orders placed in visit on 07/26/12  POCT CBC      Result Value  Range   WBC 8.1  4.6 - 10.2 K/uL   Lymph, poc 1.2  0.6 - 3.4   POC LYMPH PERCENT 14.3  10 - 50 %L   MID (cbc) 0.5  0 - 0.9   POC MID % 6.3  0 - 12 %M   POC Granulocyte 6.4  2 - 6.9   Granulocyte percent 79.4  37 - 80 %G   RBC 5.09  4.69 - 6.13 M/uL   Hemoglobin 15.4  14.1 - 18.1 g/dL   HCT, POC 16.1  09.6 - 53.7 %   MCV 92.5  80 - 97 fL   MCH, POC 30.3  27 - 31.2 pg   MCHC 32.7  31.8 - 35.4 g/dL   RDW, POC 04.5     Platelet Count, POC 176  142 - 424 K/uL   MPV 8.9  0 - 99.8 fL        Assessment & Plan:  Fever, unspecified  - Plan: POCT CBC, doxycycline (VIBRAMYCIN) 100 MG capsule  Chills - Plan: doxycycline (VIBRAMYCIN) 100 MG capsule  Myalgia - Plan: doxycycline (VIBRAMYCIN) 100 MG capsule  Post-nasal drainage - Plan: ipratropium (ATROVENT) 0.03 % nasal spray   Matthew Watson is a very pleasant 62 yr old male here with 4 days of fever, chills, myalgia, and post-nasal drainage.  Exam is remarkable for post-nasal drainage.  There is a lesion at the back of his neck that is now scabbed over - the area does not appear to be infected. Question whether this may have been a tick bite.  Doubtful that this represents sinusitis as pt denies sinus pressure and has no sinus tenderness.  CBC is normal today.  Discussed with pt that etiology is unclear but that normal CBC is reassuring.  Possible that this represents a viral syndrome but would like to be cautious and cover him for RMSF with doxy.  Pt understands and is agreeable to this plan.  Alternate Tylenol/Advil for fever reduction.  Pt takes daily Mobic - discussed with him that using ibuprofen in combo may increase risk of SEs, but I think short term use over the next 1-2 days is reasonable as his fever has been so high.  Expect significant improvement in the next 2-3 days.  If worsening or not improving pt to RTC for further eval.

## 2012-07-26 NOTE — Patient Instructions (Addendum)
Begin taking the anitbiotic (doxycycline) tonight - this will cover both tick borne illness and sinus infection.  Use the Atrovent nasal spray 2-3 times per day to help with nasal congestion and post-nasal drainage.  Continue your Allegra and Flonase.  Alternate Tylenol and Advil to reduce fever - next dose of Advil can be at 9pm, can take 400-600mg .  Then next dose of Tylenol can be 1000mg  4 hours after that.  Plenty of fluids and rest.  Please let us know if anything is worsening or not improving

## 2012-08-07 ENCOUNTER — Telehealth: Payer: Self-pay | Admitting: Internal Medicine

## 2012-08-07 DIAGNOSIS — I10 Essential (primary) hypertension: Secondary | ICD-10-CM

## 2012-08-07 DIAGNOSIS — D1801 Hemangioma of skin and subcutaneous tissue: Secondary | ICD-10-CM

## 2012-08-07 DIAGNOSIS — R0602 Shortness of breath: Secondary | ICD-10-CM

## 2012-08-07 MED ORDER — FINASTERIDE 5 MG PO TABS: ORAL_TABLET | ORAL | Status: DC

## 2012-08-07 MED ORDER — MELOXICAM 15 MG PO TABS: ORAL_TABLET | ORAL | Status: DC

## 2012-08-07 MED ORDER — ATORVASTATIN CALCIUM 40 MG PO TABS: ORAL_TABLET | ORAL | Status: DC

## 2012-08-07 MED ORDER — OMEPRAZOLE 20 MG PO CPDR: DELAYED_RELEASE_CAPSULE | ORAL | Status: DC

## 2012-08-07 MED ORDER — AMLODIPINE BESY-BENAZEPRIL HCL 5-20 MG PO CAPS: 1.0000 | ORAL_CAPSULE | Freq: Every day | ORAL | Status: DC

## 2012-08-07 NOTE — Telephone Encounter (Signed)
PT called to request refills of the following medications; omeprazole (PRILOSEC) 20 MG capsule, meloxicam (MOBIC) 15 MG tablet, atorvastatin (LIPITOR) 40 MG tablet, finasteride (PROSCAR) 5 MG tablet, and amLODipine-benazepril (LOTREL) 5-20 MG per capsule. He would like 3 month supplies called into the [facility] on [address]. Please assist.

## 2012-08-07 NOTE — Telephone Encounter (Signed)
done

## 2012-11-11 ENCOUNTER — Ambulatory Visit (INDEPENDENT_AMBULATORY_CARE_PROVIDER_SITE_OTHER): Payer: BC Managed Care – PPO

## 2012-11-11 DIAGNOSIS — Z23 Encounter for immunization: Secondary | ICD-10-CM

## 2013-01-19 ENCOUNTER — Ambulatory Visit (INDEPENDENT_AMBULATORY_CARE_PROVIDER_SITE_OTHER): Payer: BC Managed Care – PPO | Admitting: Internal Medicine

## 2013-01-19 ENCOUNTER — Encounter: Payer: Self-pay | Admitting: Internal Medicine

## 2013-01-19 VITALS — BP 120/76 | HR 76 | Temp 98.3°F | Resp 16 | Ht 68.5 in | Wt 210.0 lb

## 2013-01-19 DIAGNOSIS — N401 Enlarged prostate with lower urinary tract symptoms: Secondary | ICD-10-CM

## 2013-01-19 DIAGNOSIS — M545 Low back pain, unspecified: Secondary | ICD-10-CM

## 2013-01-19 DIAGNOSIS — I10 Essential (primary) hypertension: Secondary | ICD-10-CM

## 2013-01-19 DIAGNOSIS — N138 Other obstructive and reflux uropathy: Secondary | ICD-10-CM

## 2013-01-19 MED ORDER — HYDROCODONE-ACETAMINOPHEN 5-325 MG PO TABS: 1.0000 | ORAL_TABLET | Freq: Four times a day (QID) | ORAL | Status: DC | PRN

## 2013-01-19 MED ORDER — HYDROCODONE-ACETAMINOPHEN 5-500 MG PO TABS: 1.0000 | ORAL_TABLET | ORAL | Status: DC | PRN

## 2013-01-19 NOTE — Progress Notes (Signed)
Pre visit review using our clinic review tool, if applicable. No additional management support is needed unless otherwise documented below in the visit note. 

## 2013-01-19 NOTE — Progress Notes (Signed)
Subjective:    Patient ID: Matthew Watson, male    DOB: 28-Sep-1950, 63 y.o.   MRN: [id number]  HPI BPH with nocturia x 2 Mild seasonal allergies Chronic moderate back pain on stable Norco     Review of Systems  Constitutional: Negative for fever and fatigue.  HENT: Positive for postnasal drip, rhinorrhea and sinus pressure. Negative for congestion and hearing loss.   Eyes: Negative for discharge, redness and visual disturbance.  Respiratory: Negative for cough, shortness of breath and wheezing.   Cardiovascular: Negative for leg swelling.  Gastrointestinal: Negative for abdominal pain, constipation and abdominal distention.  Genitourinary: Negative for urgency and frequency.  Musculoskeletal: Positive for back pain, neck pain and neck stiffness. Negative for arthralgias and joint swelling.  Skin: Negative for color change and rash.  Neurological: Positive for numbness. Negative for weakness and light-headedness.  Hematological: Negative for adenopathy.  Psychiatric/Behavioral: Negative for behavioral problems.       Past Medical History  Diagnosis Date  . Hyperlipidemia   . Hypertension   . BPH (benign prostatic hyperplasia)   . Adenomatous polyp of colon   . Diverticulosis 09/2010    episode of diverticulitis  . Low back pain   . Allergy     seasonal  . Arthritis   . GERD (gastroesophageal reflux disease)     History   Social History  . Marital Status: Married    Spouse Name: N/A    Number of Children: N/A  . Years of Education: N/A   Occupational History  . Not on file.   Social History Main Topics  . Smoking status: Former Smoker    Types: Cigarettes    Quit date: 03/23/1990  . Smokeless tobacco: Never Used     Comment: quit in 1992  . Alcohol Use: 3.0 oz/week    2 Glasses of wine, 3 Cans of beer per week  . Drug Use: No  . Sexual Activity: Yes   Other Topics Concern  . Not on file   Social History Narrative  . No narrative on file     Past Surgical History  Procedure Laterality Date  . Ankle fracture surgery    . Hernia repair      jan 2012, [facility]  . Colonoscopy      Family History  Problem Relation Age of Onset  . Melanoma    . Cancer    . Colon cancer    . Cancer Father     melanoma  . Cancer Brother     melanoma  . Colon polyps Sister   . Cancer Paternal Uncle     Colon cancer  . Colon cancer Paternal Uncle     No Known Allergies  Current Outpatient Prescriptions on File Prior to Visit  Medication Sig Dispense Refill  . amLODipine-benazepril (LOTREL) 5-20 MG per capsule Take 1 capsule by mouth daily.  90 capsule  3  . aspirin 81 MG tablet Take 81 mg by mouth daily.        Marland Kitchen atorvastatin (LIPITOR) 40 MG tablet TAKE 1 TABLET DAILY  90 tablet  3  . fexofenadine-pseudoephedrine (ALLEGRA-D) 60-120 MG per tablet Take 1 tablet by mouth 2 (two) times daily as needed. Seasonal allergies      . finasteride (PROSCAR) 5 MG tablet TAKE 1 TABLET DAILY  90 tablet  3  . ipratropium (ATROVENT) 0.03 % nasal spray Place 2 sprays into the nose 2 (two) times daily.  30 mL  1  . meloxicam (  MOBIC) 15 MG tablet TAKE 1 TABLET DAILY  90 tablet  3  . omeprazole (PRILOSEC) 20 MG capsule TAKE 1 CAPSULE DAILY  90 capsule  3   No current facility-administered medications on file prior to visit.    BP 120/76  Pulse 76  Temp(Src) 98.3 F (36.8 C)  Resp 16  Ht 5' 8.5" (1.74 m)  Wt 210 lb (95.255 kg)  BMI 31.46 kg/m2    Objective:   Physical Exam  Constitutional: He appears well-developed and well-nourished.  HENT:  Head: Normocephalic and atraumatic.  Eyes: Conjunctivae are normal. Pupils are equal, round, and reactive to light.  Neck: Normal range of motion. Neck supple.  Cardiovascular: Normal rate and regular rhythm.   Murmur heard. Pulmonary/Chest: Effort normal and breath sounds normal.  Abdominal: Soft. Bowel sounds are normal.          Assessment & Plan:  BPH stable Needs referral for  opthalmology Chronic back pain Nasal congestion Saline and humidifier for dry

## 2013-01-23 ENCOUNTER — Other Ambulatory Visit: Payer: Self-pay | Admitting: Internal Medicine

## 2013-01-27 ENCOUNTER — Other Ambulatory Visit: Payer: Self-pay | Admitting: Internal Medicine

## 2013-03-04 ENCOUNTER — Telehealth: Payer: Self-pay | Admitting: Internal Medicine

## 2013-03-04 NOTE — Telephone Encounter (Signed)
Patient Information:  Caller Name: Rj  Phone: [phone]  Patient: Matthew Watson, Matthew Watson  Gender: Male  DOB: 02/04/1954  Age: 63 Years  PCP: Benay Pillow (Adults only)  Office Follow Up:  Does the office need to follow up with this patient?: No  Instructions For The Office: N/A  RN Note:  Pt. states he has only tried the Vicks Formula 44 at his home and it is expired. Before needing an appt.,he will try Robitussin or Mucinex DM, Honey and Humidifier. Call back parameters stated.  Symptoms  Reason For Call & Symptoms: C/o runny nose and congestion. On 03/03/13, developed cough and did not sleep at all last night. Coughing up dark yellow to brown mucous.  Reviewed Health History In EMR: Yes  Reviewed Medications In EMR: Yes  Reviewed Allergies In EMR: Yes  Reviewed Surgeries / Procedures: Yes  Date of Onset of Symptoms: 02/27/2013  Treatments Tried: Flonase, Allegra D. Vicks 44 cough syrup.  Treatments Tried Worked: No  Guideline(s) Used:  Cough  Disposition Per Guideline:   Home Care  Reason For Disposition Reached:   Cough with no complications  Advice Given:  Reassurance  Coughing is the way that our lungs remove irritants and mucus. It helps protect our lungs from getting pneumonia.  You can get a dry hacking cough after a chest cold. Sometimes this type of cough can last 1-3 weeks, and be worse at night.  You can also get a cough after being exposed to irritating substances like smoke, strong perfumes, and dust.  Cough Medicines:  OTC Cough Syrups: The most common cough suppressant in OTC cough medications is dextromethorphan. Often the letters "DM" appear in the name.  OTC Cough Drops: Cough drops can help a lot, especially for mild coughs. They reduce coughing by soothing your irritated throat and removing that tickle sensation in the back of the throat. Cough drops also have the advantage of portability - you can carry them with you.  Home Remedy - Hard Candy:  Hard candy works just as well as medicine-flavored OTC cough drops. Diabetics should use sugar-free candy.  Home Remedy - Honey: This old home remedy has been shown to help decrease coughing at night. The adult dosage is 2 teaspoons (10 ml) at bedtime. Honey should not be given to infants under one year of age.  OTC Cough Syrup - Dextromethorphan:  Cough syrups containing the cough suppressant dextromethorphan (DM) may help decrease your cough. Cough syrups work best for coughs that keep you awake at night. They can also sometimes help in the late stages of a respiratory infection when the cough is dry and hacking. They can be used along with cough drops.  Examples: Benylin, Robitussin DM, Vicks 44 Cough Relief  Read the package instructions for dosage, contraindications, and other important information.  Coughing Spasms:  Drink warm fluids. Inhale warm mist (Reason: both relax the airway and loosen up the phlegm).  Suck on cough drops or hard candy to coat the irritated throat.  Prevent Dehydration:  Drink adequate liquids.  This will help soothe an irritated or dry throat and loosen up the phlegm.  Fever Medicines:  Acetaminophen (e.g., Tylenol):  Regular Strength Tylenol: Take 650 mg (two 325 mg pills) by mouth every 4-6 hours as needed. Each Regular Strength Tylenol pill has 325 mg of acetaminophen.  Extra Strength Tylenol: Take 1,000 mg (two 500 mg pills) every 8 hours as needed. Each Extra Strength Tylenol pill has 500 mg of acetaminophen.  Ibuprofen (e.g.,  Motrin, Advil):  Take 400 mg (two 200 mg pills) by mouth every 6 hours.  Another choice is to take 600 mg (three 200 mg pills) by mouth every 8 hours.  The most you should take each day is 1,200 mg (six 200 mg pills), unless your doctor has told you to take more.  Expected Course:   The expected course depends on what is causing the cough.  Viral bronchitis (chest cold) causes a cough that lasts 1 to 3 weeks. Sometimes you may cough up  lots of phlegm (sputum, mucus). The mucus can normally be white, gray, yellow, or green.  Call Back If:  Difficulty breathing  Cough lasts more than 3 weeks  Fever lasts > 3 days  You become worse.  Patient Will Follow Care Advice:  YES

## 2013-05-07 ENCOUNTER — Other Ambulatory Visit: Payer: Self-pay | Admitting: Internal Medicine

## 2013-05-07 NOTE — Telephone Encounter (Signed)
Rx sent to pharmacy   

## 2013-05-24 ENCOUNTER — Other Ambulatory Visit: Payer: Self-pay | Admitting: Internal Medicine

## 2013-07-15 ENCOUNTER — Other Ambulatory Visit: Payer: BC Managed Care – PPO

## 2013-07-22 ENCOUNTER — Encounter: Payer: BC Managed Care – PPO | Admitting: Internal Medicine

## 2013-08-03 ENCOUNTER — Other Ambulatory Visit: Payer: Self-pay | Admitting: Internal Medicine

## 2013-08-05 ENCOUNTER — Other Ambulatory Visit: Payer: Self-pay | Admitting: Internal Medicine

## 2013-08-07 NOTE — Telephone Encounter (Signed)
Pt aware rx ready for pick up. 

## 2013-09-10 ENCOUNTER — Other Ambulatory Visit: Payer: Self-pay | Admitting: Internal Medicine

## 2013-09-16 ENCOUNTER — Other Ambulatory Visit: Payer: Self-pay | Admitting: Internal Medicine

## 2013-11-18 ENCOUNTER — Ambulatory Visit: Payer: BC Managed Care – PPO

## 2013-11-18 ENCOUNTER — Ambulatory Visit (INDEPENDENT_AMBULATORY_CARE_PROVIDER_SITE_OTHER): Payer: BC Managed Care – PPO | Admitting: *Deleted

## 2013-11-18 DIAGNOSIS — Z23 Encounter for immunization: Secondary | ICD-10-CM

## 2013-12-16 ENCOUNTER — Telehealth: Payer: Self-pay | Admitting: Internal Medicine

## 2013-12-16 NOTE — Telephone Encounter (Signed)
[place], [place] [phone] is requesting re-fill on HYDROcodone-acetaminophen (NORCO/VICODIN) 5-325 MG per tablet for patient.   Pharmacy has a note to call pt when rx is ready for pick up.

## 2013-12-16 NOTE — Telephone Encounter (Signed)
Pt needs an appt to get a refill.  Dr Arnoldo Morale is not here to sign

## 2013-12-17 NOTE — Telephone Encounter (Signed)
Pt has an appt with Dr. Yong Channel 02/11/14. Will Dr. Yong Channel approve??

## 2013-12-17 NOTE — Telephone Encounter (Signed)
Dr Yong Channel, will review and advise if you will sign?

## 2013-12-17 NOTE — Telephone Encounter (Signed)
Nonspecific low back pain as reason. No he needs a visit.

## 2013-12-17 NOTE — Telephone Encounter (Signed)
Spoke to patient and he states he will wait till he sees Dr. Yong Channel on 02/11/14 to discuss.

## 2014-02-11 ENCOUNTER — Ambulatory Visit (INDEPENDENT_AMBULATORY_CARE_PROVIDER_SITE_OTHER): Payer: BC Managed Care – PPO | Admitting: Family Medicine

## 2014-02-11 ENCOUNTER — Encounter: Payer: Self-pay | Admitting: Family Medicine

## 2014-02-11 VITALS — BP 148/82 | Temp 98.1°F | Wt 220.0 lb

## 2014-02-11 DIAGNOSIS — K219 Gastro-esophageal reflux disease without esophagitis: Secondary | ICD-10-CM | POA: Insufficient documentation

## 2014-02-11 DIAGNOSIS — M129 Arthropathy, unspecified: Secondary | ICD-10-CM

## 2014-02-11 DIAGNOSIS — J309 Allergic rhinitis, unspecified: Secondary | ICD-10-CM | POA: Insufficient documentation

## 2014-02-11 DIAGNOSIS — M19071 Primary osteoarthritis, right ankle and foot: Secondary | ICD-10-CM | POA: Insufficient documentation

## 2014-02-11 DIAGNOSIS — L989 Disorder of the skin and subcutaneous tissue, unspecified: Secondary | ICD-10-CM

## 2014-02-11 DIAGNOSIS — Z808 Family history of malignant neoplasm of other organs or systems: Secondary | ICD-10-CM

## 2014-02-11 DIAGNOSIS — I10 Essential (primary) hypertension: Secondary | ICD-10-CM

## 2014-02-11 DIAGNOSIS — E785 Hyperlipidemia, unspecified: Secondary | ICD-10-CM

## 2014-02-11 DIAGNOSIS — G4700 Insomnia, unspecified: Secondary | ICD-10-CM

## 2014-02-11 DIAGNOSIS — Z87891 Personal history of nicotine dependence: Secondary | ICD-10-CM | POA: Insufficient documentation

## 2014-02-11 LAB — COMPREHENSIVE METABOLIC PANEL
ALT: 22 U/L (ref 0–53)
AST: 18 U/L (ref 0–37)
Albumin: 4.4 g/dL (ref 3.5–5.2)
Alkaline Phosphatase: 89 U/L (ref 39–117)
BILIRUBIN TOTAL: 0.6 mg/dL (ref 0.2–1.2)
BUN: 19 mg/dL (ref 6–23)
CALCIUM: 9.5 mg/dL (ref 8.4–10.5)
CHLORIDE: 106 meq/L (ref 96–112)
CO2: 23 mEq/L (ref 19–32)
Creatinine, Ser: 0.94 mg/dL (ref 0.40–1.50)
GFR: 85.87 mL/min (ref >60.00)
GLUCOSE: 96 mg/dL (ref 70–99)
Potassium: 4.3 mEq/L (ref 3.5–5.1)
SODIUM: 140 meq/L (ref 135–145)
Total Protein: 7.4 g/dL (ref 6.0–8.3)

## 2014-02-11 LAB — TSH: TSH: 1.41 u[IU]/mL (ref 0.35–4.50)

## 2014-02-11 LAB — CBC
HCT: 45.3 % (ref 39.0–52.0)
HEMOGLOBIN: 15.5 g/dL (ref 13.0–17.0)
MCHC: 34.2 g/dL (ref 30.0–36.0)
MCV: 86.8 fl (ref 78.0–100.0)
PLATELETS: 274 10*3/uL (ref 150.0–400.0)
RBC: 5.22 Mil/uL (ref 4.22–5.81)
RDW: 12.8 % (ref 11.5–15.5)
WBC: 9.9 10*3/uL (ref 4.0–10.5)

## 2014-02-11 LAB — LDL CHOLESTEROL, DIRECT: Direct LDL: 90 mg/dL

## 2014-02-11 MED ORDER — HYDROCODONE-ACETAMINOPHEN 5-325 MG PO TABS: ORAL_TABLET | ORAL | Status: DC

## 2014-02-11 NOTE — Assessment & Plan Note (Addendum)
Given sparing use, refilld hydrocodone, per patient reports of use, this should last him at least 1/2 a year/30 weeks. Next refill would be planned for august at earliest. Mobic is baseline control and GFR >80 so agreed to continue, if gets under 60 would definitely need to discuss coming off.

## 2014-02-11 NOTE — Assessment & Plan Note (Signed)
Refer to dermatology given family history with Brother and father died at or before age 64. He has multiple lesions which could use dermatology eyes including a cyst on his back and a not typical SK on his stomach

## 2014-02-11 NOTE — Patient Instructions (Addendum)
Blood pressure was slightly high today but looked great a year ago. I want you to check whenever you go into a pharmacy or [facility] and if regularly over 140 on the top or over 90 on the bottom, come see [place] sooner. Otherwise, see [place] in 6 months. Keep taking same medications. Recommend regular exercise. Eating plan below can help lower blood pressure as well.   Send you to dermatology given your family history melanoma and to have them look at the cyst on your back  Refilled pain medicine   DASH Eating Plan DASH stands for "Dietary Approaches to Stop Hypertension." The DASH eating plan is a healthy eating plan that has been shown to reduce high blood pressure (hypertension). Additional health benefits may include reducing the risk of type 2 diabetes mellitus, heart disease, and stroke. The DASH eating plan may also help with weight loss. WHAT DO I NEED TO KNOW ABOUT THE DASH EATING PLAN? For the DASH eating plan, you will follow these general guidelines:  Choose foods with a percent daily value for sodium of less than 5% (as listed on the food label).  Use salt-free seasonings or herbs instead of table salt or sea salt.  Check with your health care provider or pharmacist before using salt substitutes.  Eat lower-sodium products, often labeled as "lower sodium" or "no salt added."  Eat fresh foods.  Eat more vegetables, fruits, and low-fat dairy products.  Choose whole grains. Look for the word "whole" as the first word in the ingredient list.  Choose fish and skinless chicken or [place] more often than red meat. Limit fish, poultry, and meat to 6 oz (170 g) each day.  Limit sweets, desserts, sugars, and sugary drinks.  Choose heart-healthy fats.  Limit cheese to 1 oz (28 g) per day.  Eat more home-cooked food and less restaurant, buffet, and fast food.  Limit fried foods.  Cook foods using methods other than frying.  Limit canned vegetables. If you do use them, rinse them well to  decrease the sodium.  When eating at a restaurant, ask that your food be prepared with less salt, or no salt if possible. WHAT FOODS CAN I EAT? Seek help from a dietitian for individual calorie needs. Grains Whole grain or whole wheat bread. Brown rice. Whole grain or whole wheat pasta. Quinoa, bulgur, and whole grain cereals. Low-sodium cereals. Corn or whole wheat flour tortillas. Whole grain cornbread. Whole grain crackers. Low-sodium crackers. Vegetables Fresh or frozen vegetables (raw, steamed, roasted, or grilled). Low-sodium or reduced-sodium tomato and vegetable juices. Low-sodium or reduced-sodium tomato sauce and paste. Low-sodium or reduced-sodium canned vegetables.  Fruits All fresh, canned (in natural juice), or frozen fruits. Meat and Other Protein Products Ground beef (85% or leaner), grass-fed beef, or beef trimmed of fat. Skinless chicken or [place]. Ground chicken or [place]. Pork trimmed of fat. All fish and seafood. Eggs. Dried beans, peas, or lentils. Unsalted nuts and seeds. Unsalted canned beans. Dairy Low-fat dairy products, such as skim or 1% milk, 2% or reduced-fat cheeses, low-fat ricotta or cottage cheese, or plain low-fat yogurt. Low-sodium or reduced-sodium cheeses. Fats and Oils Tub margarines without trans fats. Light or reduced-fat mayonnaise and salad dressings (reduced sodium). Avocado. Safflower, olive, or canola oils. Natural peanut or almond butter. Other Unsalted popcorn and pretzels. The items listed above may not be a complete list of recommended foods or beverages. Contact your dietitian for more options. WHAT FOODS ARE NOT RECOMMENDED? Grains White bread. White pasta. White rice.  Refined cornbread. Bagels and croissants. Crackers that contain trans fat. Vegetables Creamed or fried vegetables. Vegetables in a cheese sauce. Regular canned vegetables. Regular canned tomato sauce and paste. Regular tomato and vegetable juices. Fruits Dried fruits. Canned  fruit in light or heavy syrup. Fruit juice. Meat and Other Protein Products Fatty cuts of meat. Ribs, chicken wings, bacon, sausage, bologna, salami, chitterlings, fatback, hot dogs, bratwurst, and packaged luncheon meats. Salted nuts and seeds. Canned beans with salt. Dairy Whole or 2% milk, cream, half-and-half, and cream cheese. Whole-fat or sweetened yogurt. Full-fat cheeses or blue cheese. Nondairy creamers and whipped toppings. Processed cheese, cheese spreads, or cheese curds. Condiments Onion and garlic salt, seasoned salt, table salt, and sea salt. Canned and packaged gravies. Worcestershire sauce. Tartar sauce. Barbecue sauce. Teriyaki sauce. Soy sauce, including reduced sodium. Steak sauce. Fish sauce. Oyster sauce. Cocktail sauce. Horseradish. Ketchup and mustard. Meat flavorings and tenderizers. Bouillon cubes. Hot sauce. Tabasco sauce. Marinades. Taco seasonings. Relishes. Fats and Oils Butter, stick margarine, lard, shortening, ghee, and bacon fat. Coconut, palm kernel, or palm oils. Regular salad dressings. Other Pickles and olives. Salted popcorn and pretzels. The items listed above may not be a complete list of foods and beverages to avoid. Contact your dietitian for more information. WHERE CAN I FIND MORE INFORMATION? National Heart, Lung, and Blood Institute: travelstabloid.com Document Released: 12/21/2010 Document Revised: 05/18/2013 Document Reviewed: 11/05/2012 [facility] Patient Information 2015 [place], [place]. This information is not intended to replace advice given to you by your health care provider. Make sure you discuss any questions you have with your health care provider.

## 2014-02-11 NOTE — Assessment & Plan Note (Signed)
No rx. Feels rested but sleeps only 4 hours then tosses and turns for 4 hours. If stops feeling rested, consider rx

## 2014-02-11 NOTE — Progress Notes (Signed)
Matthew Reddish, MD Phone: [phone]  Subjective:  Patient presents today to establish care with me as their new primary care provider. Patient was formerly a patient of Dr. Arnoldo Morale. Chief complaint-noted.   Family history melanoma Father died at 62 and brother at 75 both from [place]. 2 uncles on fathers side died of colon cancer. Small bump on back that seems to be getting bigger. Might want to see dermatologist.   insomnia Gets up 4am, goes to bed 8-9, sleeps well first 4 hours. Gets stopped up with congestion. Tried humidifiers but hasn't helped sleep. Toss and turns final 4 hours. Feels rested when he gets up.   Hypertension-mild poor control but reasonable for age on Amlodipine-benazepril 5-20mg .  BP Readings from Last 3 Encounters:  02/11/14 148/82  01/19/13 120/76  07/26/12 145/85  Home BP monitoring-no but available at pharmacies he frequents Compliant with medications-yes without side effects ROS-Denies any CP, HA, SOB, blurry vision, LE edema.    Hyperlipidemia-controlled on atorvastatin 40mg   Lab Results  Component Value Date   LDLCALC 92 07/07/2012  On statin: atorvastatin 40mg , asa ROS- no chest pain or shortness of breath. No myalgias  Chronic ankle pain from arthritis/prior fracture-reasonable control Patient takes approximately 1-2 vicodin a week for ankle pain especially when he is more active.  Ros-no worsening ankle swelling, redness  The following were reviewed and entered/updated in epic: Past Medical History  Diagnosis Date  . Hyperlipidemia   . Hypertension   . BPH (benign prostatic hyperplasia)   . Adenomatous polyp of colon   . Diverticulosis 09/2010    episode of diverticulitis  . Low back pain   . Allergy     seasonal  . Arthritis   . GERD (gastroesophageal reflux disease)    Patient Active Problem List   Diagnosis Date Noted  . Arthritis of right ankle 02/11/2014    Priority: Medium  . BPH (benign prostatic hyperplasia) 12/17/2006    Priority: Medium  . Hyperlipidemia 08/19/2006    Priority: Medium  . Essential hypertension 08/19/2006    Priority: Medium  . Allergic rhinitis 02/11/2014    Priority: Low  . Family history of melanoma 02/11/2014    Priority: Low  . GERD (gastroesophageal reflux disease) 02/11/2014    Priority: Low  . Former smoker 02/11/2014    Priority: Low  . INSOMNIA, CHRONIC 07/01/2009    Priority: Low  . Actinic keratosis 12/17/2006    Priority: Low  . History of colonic polyps 08/26/2006    Priority: Low   Past Surgical History  Procedure Laterality Date  . Ankle fracture surgery      chronic pain as result  . Hernia repair      jan 2012, [place]  . Colonoscopy      Family History  Problem Relation Age of Onset  . Cancer Father     melanoma, cousin also died of it  . Cancer Brother     melanoma  . Colon polyps Sister   . Cancer Paternal Uncle     Colon cancer  . Colon cancer Paternal Uncle     Medications- reviewed and updated Current Outpatient Prescriptions  Medication Sig Dispense Refill  . amLODipine-benazepril (LOTREL) 5-20 MG per capsule TAKE ONE CAPSULE BY MOUTH DAILY 90 capsule 2  . aspirin 81 MG tablet Take 81 mg by mouth daily.      Marland Kitchen atorvastatin (LIPITOR) 40 MG tablet TAKE 1 TABLET BY MOUTH DAILY 90 tablet 1  . fexofenadine-pseudoephedrine (ALLEGRA-D) 60-120 MG per tablet  Take 1 tablet by mouth 2 (two) times daily as needed. Seasonal allergies    . finasteride (PROSCAR) 5 MG tablet TAKE 1 TABLET BY MOUTH DAILY 90 tablet 1  . fluticasone (FLONASE) 50 MCG/ACT nasal spray PLACE 2 SPRAYS INTO THE NOSE DAILY. 16 g 1  . meloxicam (MOBIC) 15 MG tablet TAKE 1 TABLET BY MOUTH DAILY 90 tablet 1  . omeprazole (PRILOSEC) 20 MG capsule TAKE ONE CAPSULE BY MOUTH DAILY 90 capsule 2  . HYDROcodone-acetaminophen (NORCO/VICODIN) 5-325 MG per tablet TAKE 1/2-1 TABLET BY MOUTH EVERY 8 HOURS AS NEEDED FOR MODERATE PAIN. Max 5 per week. 60 tablet 0   Allergies-reviewed and  updated No Known Allergies  History   Social History  . Marital Status: Married    Spouse Name: N/A    Number of Children: N/A  . Years of Education: N/A   Social History Main Topics  . Smoking status: Former Smoker -- 0.25 packs/day for 10 years    Types: Cigarettes    Quit date: 03/23/1990  . Smokeless tobacco: Never Used     Comment: quit in 1992  . Alcohol Use: 3.0 oz/week    2 Glasses of wine, 3 Cans of beer per week  . Drug Use: No  . Sexual Activity: Yes   Other Topics Concern  . None   Social History Narrative   Married >30 years in 2016. No children. 2 dogs.       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 Oropharynx normal CV: RRR no murmurs rubs or gallops Lungs: CTAB no crackles, wheeze, rhonchi Abdomen: soft/nontender/nondistended/normal bowel sounds.  Ext: no edema Skin: warm, dry, several SK, one with some erythema at its base on his stomach, also with cyst in midback Neuro: grossly normal, moves all extremities, PERRLA   Assessment/Plan:  Essential hypertension mild poor control but reasonable for age on   Amlodipine-benazepril 5-20mg . Discussed dash diet. Home monitoring and if elevated regularly above 140/90 then would want to push for lower goal of <140 although up to 150 reasonable for age with [facility].     Hyperlipidemia Direct LDL 90. Continue lipitor 40mg    Arthritis of right ankle Given sparing use, refilld hydrocodone, per patient reports of use, this should last him at least 1/2 a year/30 weeks. Next refill would be planned for august at earliest. Mobic is baseline control and GFR >80 so agreed to continue, if gets under 60 would definitely need to discuss coming off.    INSOMNIA, CHRONIC No rx. Feels rested but sleeps  only 4 hours then tosses and turns for 4 hours. If stops feeling rested, consider rx   Family history of melanoma Refer to dermatology given family history with Brother and father died at or before age 66. He has multiple lesions which could use dermatology eyes including a cyst on his back and a not typical SK on his stomach    Return precautions advised. Needs 6 month follow up unless BP elevated with home monitoring  nonfasting Results for orders placed or performed in visit on 02/11/14 (from the past 24 hour(s))  LDL cholesterol, direct     Status: None   Collection Time: 02/11/14  9:23 AM  Result Value Ref Range   Direct  LDL 90.0 mg/dL  Comprehensive metabolic panel     Status: None   Collection Time: 02/11/14  9:23 AM  Result Value Ref Range   Sodium 140 135 - 145 mEq/L   Potassium 4.3 3.5 - 5.1 mEq/L   Chloride 106 96 - 112 mEq/L   CO2 23 19 - 32 mEq/L   Glucose, Bld 96 70 - 99 mg/dL   BUN 19 6 - 23 mg/dL   Creatinine, Ser 0.94 0.40 - 1.50 mg/dL   Total Bilirubin 0.6 0.2 - 1.2 mg/dL   Alkaline Phosphatase 89 39 - 117 U/L   AST 18 0 - 37 U/L   ALT 22 0 - 53 U/L   Total Protein 7.4 6.0 - 8.3 g/dL   Albumin 4.4 3.5 - 5.2 g/dL   Calcium 9.5 8.4 - 10.5 mg/dL   GFR 85.87 >60.00 mL/min  CBC     Status: None   Collection Time: 02/11/14  9:23 AM  Result Value Ref Range   WBC 9.9 4.0 - 10.5 K/uL   RBC 5.22 4.22 - 5.81 Mil/uL   Platelets 274.0 150.0 - 400.0 K/uL   Hemoglobin 15.5 13.0 - 17.0 g/dL   HCT 45.3 39.0 - 52.0 %   MCV 86.8 78.0 - 100.0 fl   MCHC 34.2 30.0 - 36.0 g/dL   RDW 12.8 11.5 - 15.5 %  TSH     Status: None   Collection Time: 02/11/14  9:23 AM  Result Value Ref Range   TSH 1.41 0.35 - 4.50 uIU/mL    . Ambulatory referral to Dermatology    Referral Priority:  Routine    Referral Type:  Consultation    Referral Reason:  Specialty Services Required    Requested Specialty:  Dermatology    Number of Visits Requested:  1    Meds ordered this encounter   Medications  . HYDROcodone-acetaminophen (NORCO/VICODIN) 5-325 MG per tablet    Sig: TAKE 1/2-1 TABLET BY MOUTH EVERY 8 HOURS AS NEEDED FOR MODERATE PAIN. Max 5 per week.    Dispense:  60 tablet    Refill:  0

## 2014-02-11 NOTE — Assessment & Plan Note (Signed)
Direct LDL 90. Continue lipitor 40mg 

## 2014-02-11 NOTE — Assessment & Plan Note (Signed)
mild poor control but reasonable for age on   Amlodipine-benazepril 5-20mg . Discussed dash diet. Home monitoring and if elevated regularly above 140/90 then would want to push for lower goal of <140 although up to 150 reasonable for age with [facility].

## 2014-03-03 ENCOUNTER — Telehealth: Payer: Self-pay

## 2014-03-03 MED ORDER — FINASTERIDE 5 MG PO TABS: 5.0000 mg | ORAL_TABLET | Freq: Every day | ORAL | Status: DC

## 2014-03-03 MED ORDER — ATORVASTATIN CALCIUM 40 MG PO TABS: 40.0000 mg | ORAL_TABLET | Freq: Every day | ORAL | Status: DC

## 2014-03-03 NOTE — Telephone Encounter (Signed)
[facility] refill request for FINASTERIDE 5MG  TAB. Last filled 11/25/2013

## 2014-03-03 NOTE — Telephone Encounter (Signed)
Medications refilled

## 2014-03-03 NOTE — Telephone Encounter (Signed)
Also ATORVASTATIN 40MG  TAB

## 2014-03-26 ENCOUNTER — Telehealth: Payer: Self-pay | Admitting: *Deleted

## 2014-03-26 MED ORDER — MELOXICAM 15 MG PO TABS: 15.0000 mg | ORAL_TABLET | Freq: Every day | ORAL | Status: DC

## 2014-03-26 NOTE — Telephone Encounter (Signed)
Refill mexloxicam 15 mg once daily #90 Matthew Watson Matthew Watson

## 2014-03-26 NOTE — Telephone Encounter (Signed)
Medication refilled

## 2014-05-04 ENCOUNTER — Telehealth: Payer: Self-pay

## 2014-05-04 MED ORDER — AMLODIPINE BESY-BENAZEPRIL HCL 5-20 MG PO CAPS: 1.0000 | ORAL_CAPSULE | Freq: Every day | ORAL | Status: DC

## 2014-05-04 MED ORDER — OMEPRAZOLE 20 MG PO CPDR: 20.0000 mg | DELAYED_RELEASE_CAPSULE | Freq: Every day | ORAL | Status: DC

## 2014-05-04 NOTE — Telephone Encounter (Signed)
[facility] refill request for omeprazole (PRILOSEC) 20 MG capsule

## 2014-05-04 NOTE — Telephone Encounter (Signed)
Medications refilled

## 2014-05-04 NOTE — Telephone Encounter (Signed)
Also for amLODipine-benazepril (LOTREL) 5-20 MG per capsule

## 2014-05-07 ENCOUNTER — Other Ambulatory Visit: Payer: Self-pay | Admitting: *Deleted

## 2014-05-07 MED ORDER — FLUTICASONE PROPIONATE 50 MCG/ACT NA SUSP: NASAL | Status: DC

## 2014-06-25 ENCOUNTER — Encounter: Payer: Self-pay | Admitting: Gastroenterology

## 2014-07-14 ENCOUNTER — Other Ambulatory Visit: Payer: Self-pay

## 2014-07-14 MED ORDER — MELOXICAM 15 MG PO TABS: 15.0000 mg | ORAL_TABLET | Freq: Every day | ORAL | Status: DC

## 2014-08-12 ENCOUNTER — Ambulatory Visit (INDEPENDENT_AMBULATORY_CARE_PROVIDER_SITE_OTHER): Payer: BC Managed Care – PPO | Admitting: Family Medicine

## 2014-08-12 ENCOUNTER — Encounter: Payer: Self-pay | Admitting: Family Medicine

## 2014-08-12 VITALS — BP 144/84 | HR 74 | Temp 98.4°F | Wt 214.0 lb

## 2014-08-12 DIAGNOSIS — M129 Arthropathy, unspecified: Secondary | ICD-10-CM | POA: Diagnosis not present

## 2014-08-12 DIAGNOSIS — I10 Essential (primary) hypertension: Secondary | ICD-10-CM | POA: Diagnosis not present

## 2014-08-12 DIAGNOSIS — J302 Other seasonal allergic rhinitis: Secondary | ICD-10-CM

## 2014-08-12 DIAGNOSIS — M19071 Primary osteoarthritis, right ankle and foot: Secondary | ICD-10-CM

## 2014-08-12 LAB — BASIC METABOLIC PANEL
BUN: 17 mg/dL (ref 6–23)
CALCIUM: 9.3 mg/dL (ref 8.4–10.5)
CO2: 27 mEq/L (ref 19–32)
CREATININE: 0.81 mg/dL (ref 0.40–1.50)
Chloride: 106 mEq/L (ref 96–112)
GFR: 101.8 mL/min (ref >60.00)
Glucose, Bld: 102 mg/dL — ABNORMAL HIGH (ref 70–99)
POTASSIUM: 3.6 meq/L (ref 3.5–5.1)
SODIUM: 139 meq/L (ref 135–145)

## 2014-08-12 MED ORDER — HYDROCODONE-ACETAMINOPHEN 5-325 MG PO TABS: ORAL_TABLET | ORAL | Status: DC

## 2014-08-12 NOTE — Patient Instructions (Addendum)
Great job with weight loss. Blood pressure has responded slightly but still not quite at goal. Continue your walking, decreased beer intake and let's recheck 6 months, if still high plan on adjusting medications  Refilled pain medicine hopefully to last you 6 months. Can take aleve twice a day instead of mobic but cannot take both at same time.   For congestion at night- try neti pot a few hours before bed. If you are not making improvement with your allergy pill, flonase, and this, let's refer you to allergy asthma. Call me in 2-3 months if you want to proceed with this referral.   Stop by lab- make sure kidney function continues to tolerate the aleve/mobic. This does likely raise blood pressure some but glad it makes day to day more tolerable.

## 2014-08-12 NOTE — Assessment & Plan Note (Signed)
Doing well in summer with minimal narcotic use. Controlled mainly with mobic. Aleve has worked some better and told patient he can take this instead of mobic but not together with it. He has about 6 hydrocodone remaining and I gave him another 45 to hopefully last 6 months with usually 1-2x a week usage (sometimes up to 5x a week though).

## 2014-08-12 NOTE — Progress Notes (Addendum)
Garret Reddish, MD  Subjective:  Matthew Watson is a 64 y.o. year old very pleasant male patient who presents with:  Hypertension-mild poor control but at goal per [facility] 8 Weight down 6 lbs! Walking an hour 7 days a week, cut 12 pack to 6 pack BP Readings from Last 3 Encounters:  08/12/14 144/84  02/11/14 148/82  01/19/13 120/76   Home BP monitoring-does not check Compliant with medications-yes without side effects ROS-Denies any CP, HA, SOB, blurry vision, LE edema except crhonic in right ankle  Arthritis right ankle- chronic pain [place] Chronic nsaid on mobic- needs BMET. Not using hydrocodone, worse in the winter (rabbit hunting and going through fields and [place]). Has about 6 pills remaining.  Also having some hip and knee pain on left. Tried aleve and this seemed to help better than mobic  INsomnia/allergies Continues to wake up after about 4 hours with head filled with congestion and has trouble getting back to  Sleep. Takes antihistamine and flonase but continues to have issues.  ROS- no sinus pressure. Does have drainage down back of throat in daytime.   Past Medical History- HLD, BPH  Medications- reviewed and updated Current Outpatient Prescriptions  Medication Sig Dispense Refill  . amLODipine-benazepril (LOTREL) 5-20 MG per capsule Take 1 capsule by mouth daily. 90 capsule 3  . aspirin 81 MG tablet Take 81 mg by mouth daily.      Marland Kitchen atorvastatin (LIPITOR) 40 MG tablet Take 1 tablet (40 mg total) by mouth daily. 90 tablet 1  . fexofenadine-pseudoephedrine (ALLEGRA-D) 60-120 MG per tablet Take 1 tablet by mouth 2 (two) times daily as needed. Seasonal allergies    . finasteride (PROSCAR) 5 MG tablet Take 1 tablet (5 mg total) by mouth daily. 90 tablet 1  . fluticasone (FLONASE) 50 MCG/ACT nasal spray PLACE 2 SPRAYS INTO THE NOSE DAILY. 16 g 5  . meloxicam (MOBIC) 15 MG tablet Take 1 tablet (15 mg total) by mouth daily. 90 tablet 0  . omeprazole (PRILOSEC) 20 MG capsule  Take 1 capsule (20 mg total) by mouth daily. 90 capsule 3  . HYDROcodone-acetaminophen (NORCO/VICODIN) 5-325 MG per tablet TAKE 1/2-1 TABLET BY MOUTH EVERY 8 HOURS AS NEEDED FOR MODERATE PAIN. Max 5 per week. (Patient not taking: Reported on 08/12/2014) 60 tablet 0    Objective: BP 144/84 mmHg  Pulse 74  Temp(Src) 98.4 F (36.9 C)  Wt 214 lb (97.07 kg) Gen: NAD, resting comfortably CV: RRR no murmurs rubs or gallops Lungs: CTAB no crackles, wheeze, rhonchi Abdomen: soft/nontender/nondistended/normal bowel sounds. No rebound or guarding.  Ext: no edema Skin: warm, dry Neuro: grossly normal, moves all extremities  Assessment/Plan:  Arthritis of right ankle Doing well in summer with minimal narcotic use. Controlled mainly with mobic. Aleve has worked some better and told patient he can take this instead of mobic but not together with it. He has about 6 hydrocodone remaining and I gave him another 45 to hopefully last 6 months with usually 1-2x a week usage (sometimes up to 5x a week though).   Essential hypertension BP slightlyimproved. Weight down 6 lbs. Continue amlodipine- benazepril 5-20mg . Discussed if elevated above 140 at next visit would likely push for lower by adjusting combo med- likely to 5-40 as next step.   Allergic rhinitis Reasonable daytime control on allegra and flonase but waking up with congestion about 4 hours into sleep. We discussed adding neti pot every other day to see if it helps and trial 2-3 months. Could  refer to allergy/asthma if needed by phone. Could consider ENT as alternative.    Return precautions advised.   Orders Placed This Encounter  Procedures  . Basic metabolic panel    [place]    Meds ordered this encounter  Medications  . HYDROcodone-acetaminophen (NORCO/VICODIN) 5-325 MG per tablet    Sig: TAKE 1/2-1 TABLET BY MOUTH EVERY 8 HOURS AS NEEDED FOR MODERATE PAIN. Max 5 per week. Goal to last through 02/11/14    Dispense:  45 tablet     Refill:  0

## 2014-08-12 NOTE — Assessment & Plan Note (Signed)
BP slightlyimproved. Weight down 6 lbs. Continue amlodipine- benazepril 5-20mg . Discussed if elevated above 140 at next visit would likely push for lower by adjusting combo med- likely to 5-40 as next step.

## 2014-08-12 NOTE — Assessment & Plan Note (Signed)
Reasonable daytime control on allegra and flonase but waking up with congestion about 4 hours into sleep. We discussed adding neti pot every other day to see if it helps and trial 2-3 months. Could refer to allergy/asthma if needed by phone. Could consider ENT as alternative.

## 2014-10-11 ENCOUNTER — Other Ambulatory Visit: Payer: Self-pay | Admitting: Family Medicine

## 2014-12-07 ENCOUNTER — Ambulatory Visit (INDEPENDENT_AMBULATORY_CARE_PROVIDER_SITE_OTHER): Payer: BC Managed Care – PPO

## 2014-12-07 DIAGNOSIS — Z23 Encounter for immunization: Secondary | ICD-10-CM | POA: Diagnosis not present

## 2015-01-11 ENCOUNTER — Other Ambulatory Visit: Payer: Self-pay | Admitting: Family Medicine

## 2015-02-15 ENCOUNTER — Ambulatory Visit (INDEPENDENT_AMBULATORY_CARE_PROVIDER_SITE_OTHER): Payer: Medicare Other | Admitting: Family Medicine

## 2015-02-15 ENCOUNTER — Other Ambulatory Visit: Payer: Self-pay | Admitting: Family Medicine

## 2015-02-15 ENCOUNTER — Encounter: Payer: Self-pay | Admitting: Family Medicine

## 2015-02-15 VITALS — BP 130/68 | HR 70 | Temp 98.7°F | Wt 220.0 lb

## 2015-02-15 DIAGNOSIS — I10 Essential (primary) hypertension: Secondary | ICD-10-CM | POA: Diagnosis not present

## 2015-02-15 DIAGNOSIS — J3489 Other specified disorders of nose and nasal sinuses: Secondary | ICD-10-CM | POA: Diagnosis not present

## 2015-02-15 DIAGNOSIS — Z23 Encounter for immunization: Secondary | ICD-10-CM | POA: Diagnosis not present

## 2015-02-15 DIAGNOSIS — Z791 Long term (current) use of non-steroidal anti-inflammatories (NSAID): Secondary | ICD-10-CM | POA: Diagnosis not present

## 2015-02-15 DIAGNOSIS — J302 Other seasonal allergic rhinitis: Secondary | ICD-10-CM

## 2015-02-15 DIAGNOSIS — M129 Arthropathy, unspecified: Secondary | ICD-10-CM

## 2015-02-15 DIAGNOSIS — Z20828 Contact with and (suspected) exposure to other viral communicable diseases: Secondary | ICD-10-CM | POA: Diagnosis not present

## 2015-02-15 DIAGNOSIS — M19071 Primary osteoarthritis, right ankle and foot: Secondary | ICD-10-CM

## 2015-02-15 LAB — BASIC METABOLIC PANEL
BUN: 19 mg/dL (ref 6–23)
CHLORIDE: 105 meq/L (ref 96–112)
CO2: 26 meq/L (ref 19–32)
CREATININE: 0.9 mg/dL (ref 0.40–1.50)
Calcium: 9.3 mg/dL (ref 8.4–10.5)
GFR: 90 mL/min (ref >60.00)
Glucose, Bld: 80 mg/dL (ref 70–99)
POTASSIUM: 4.2 meq/L (ref 3.5–5.1)
Sodium: 141 mEq/L (ref 135–145)

## 2015-02-15 NOTE — Progress Notes (Signed)
Garret Reddish, MD  Subjective:  Matthew Watson is a 65 y.o. year old very pleasant male patient who presents for/with See problem oriented charting ROS- No chest pain or shortness of breath. No headache or blurry vision. Chronic R ankle pain reasonable control on mobic  Past Medical History-  Patient Active Problem List   Diagnosis Date Noted  . Arthritis of right ankle 02/11/2014    Priority: Medium  . BPH (benign prostatic hyperplasia) 12/17/2006    Priority: Medium  . Hyperlipidemia 08/19/2006    Priority: Medium  . Essential hypertension 08/19/2006    Priority: Medium  . Allergic rhinitis 02/11/2014    Priority: Low  . Family history of melanoma 02/11/2014    Priority: Low  . GERD (gastroesophageal reflux disease) 02/11/2014    Priority: Low  . Former smoker 02/11/2014    Priority: Low  . INSOMNIA, CHRONIC 07/01/2009    Priority: Low  . Actinic keratosis 12/17/2006    Priority: Low  . History of colonic polyps 08/26/2006    Priority: Low    Medications- reviewed and updated Current Outpatient Prescriptions  Medication Sig Dispense Refill  . amLODipine-benazepril (LOTREL) 5-20 MG per capsule Take 1 capsule by mouth daily. 90 capsule 3  . aspirin 81 MG tablet Take 81 mg by mouth daily.      Marland Kitchen atorvastatin (LIPITOR) 40 MG tablet TAKE 1 TABLET (40 MG TOTAL) BY MOUTH DAILY. 90 tablet 2  . finasteride (PROSCAR) 5 MG tablet TAKE 1 TABLET (5 MG TOTAL) BY MOUTH DAILY. 90 tablet 2  . fluticasone (FLONASE) 50 MCG/ACT nasal spray PLACE 2 SPRAYS INTO THE NOSE DAILY. 16 g 5  . meloxicam (MOBIC) 15 MG tablet TAKE 1 TABLET (15 MG TOTAL) BY MOUTH DAILY. 90 tablet 2  . omeprazole (PRILOSEC) 20 MG capsule TAKE 1 CAPSULE (20 MG TOTAL) BY MOUTH DAILY. 90 capsule 2  . fexofenadine-pseudoephedrine (ALLEGRA-D) 60-120 MG per tablet Take 1 tablet by mouth 2 (two) times daily as needed. Reported on 02/15/2015    . HYDROcodone-acetaminophen (NORCO/VICODIN) 5-325 MG per tablet TAKE 1/2-1 TABLET  BY MOUTH EVERY 8 HOURS AS NEEDED FOR MODERATE PAIN. Max 5 per week. Goal to last through 02/11/14 (Patient not taking: Reported on 02/15/2015) 45 tablet 0   No current facility-administered medications for this visit.    Objective: BP 130/68 mmHg  Pulse 70  Temp(Src) 98.7 F (37.1 C)  Wt 220 lb (99.791 kg) Gen: NAD, resting comfortably Oropharynx normal, no obvious cause for obstruction in viewing nasal turbinates CV: RRR no murmurs rubs or gallops Lungs: CTAB no crackles, wheeze, rhonchi Abdomen: soft/nontender/nondistended/normal bowel sounds. No rebound or guarding.  Ext: no edema Skin: warm, dry Neuro: grossly normal, moves all extremities, limps when walking on right leg  Assessment/Plan:  Essential hypertension S: controlled surprisingly on Amlodipine-benazepril 5-20mg , previously mild poor control BP Readings from Last 3 Encounters:  02/15/15 130/68  08/12/14 144/84  02/11/14 148/82  A/P:Continue current meds:  Continue to monitor every 6 months. Consider increasing medication- weight loss also likely to help   Arthritis of right ankle S: chronic ankle pain after prior fracture worse in winter. Aleve worked for pain but had more swelling. Switched back to [employer] and doing well. Winter months still hard and uses hydrocodone 1-2 days a week. He was given #45 last visit and has not run out- thinks he has enough to get him through winter months then will refill next winter. Also had period of plantar fasciitis which has resolved- asks  my advice on this A/P: continue daily mobic- check in on kidney function today. Would stop if GFR below 60 at any point likely. Continue prn hydrocodone- very sparing use fortunately. Refill next visit in 6 months likely. Also counseled on stretches and exercises for plantar fasciitis and icing if recurs  Nasal obstruction- continued issues Allergic Rhinitis- reasonable control S:4 hours into sleep waking up congested despite allegra and flonase.  Also had large volume clear rhinorrhea in mornings. He has started neti pot daily and that has helped but he still wakes up and at least one side of nasal passages is completely obstructed. Mainly only happens when laying down. Sometimes wakes up and mouth completely dry.  A/P: allergic rhinitis seems well controlled but continues to have nasal obstruction issues. We will refer to ENT at this time for further evalation  Return in about 6 months (around 08/15/2015) for physical. come in fasting and update full labs. Return precautions advised.   Orders Placed This Encounter  Procedures  . Pneumococcal conjugate vaccine 13-valent  . Basic metabolic panel    [facility]  . Hepatitis C antibody, reflex    [facility]  . Ambulatory referral to ENT    Referral Priority:  Routine    Referral Type:  Consultation    Referral Reason:  Specialty Services Required    Requested Specialty:  Otolaryngology    Number of Visits Requested:  1

## 2015-02-15 NOTE — Assessment & Plan Note (Signed)
S: controlled surprisingly on Amlodipine-benazepril 5-20mg , previously mild poor control BP Readings from Last 3 Encounters:  02/15/15 130/68  08/12/14 144/84  02/11/14 148/82  A/P:Continue current meds:  Continue to monitor every 6 months. Consider increasing medication- weight loss also likely to help

## 2015-02-15 NOTE — Assessment & Plan Note (Addendum)
S: chronic ankle pain after prior fracture worse in winter. Aleve worked for pain but had more swelling. Switched back to [employer] and doing well. Winter months still hard and uses hydrocodone 1-2 days a week. He was given #45 last visit and has not run out- thinks he has enough to get him through winter months then will refill next winter. Also had period of plantar fasciitis which has resolved- asks my advice on this A/P: continue daily mobic- check in on kidney function today. Would stop if GFR below 60 at any point likely. Continue prn hydrocodone- very sparing use fortunately. Refill next visit in 6 months likely. Also counseled on stretches and exercises for plantar fasciitis and icing if recurs

## 2015-02-15 NOTE — Patient Instructions (Signed)
We will call you within a week about your referral to ENT for nasal obstruction. If you do not hear within 2 weeks, give [place] a call.   Blood pressure surprisingly looks better. Continue current medicine and we will monitor  Labs before you leave

## 2015-02-16 LAB — HEPATITIS C ANTIBODY: HCV Ab: NEGATIVE

## 2015-04-15 ENCOUNTER — Other Ambulatory Visit: Payer: Self-pay | Admitting: Family Medicine

## 2015-04-25 ENCOUNTER — Other Ambulatory Visit: Payer: Self-pay | Admitting: Family Medicine

## 2015-05-09 ENCOUNTER — Other Ambulatory Visit: Payer: Self-pay | Admitting: *Deleted

## 2015-05-09 MED ORDER — FLUTICASONE PROPIONATE 50 MCG/ACT NA SUSP: NASAL | Status: DC

## 2015-08-09 ENCOUNTER — Other Ambulatory Visit (INDEPENDENT_AMBULATORY_CARE_PROVIDER_SITE_OTHER): Payer: Medicare Other

## 2015-08-09 DIAGNOSIS — Z0000 Encounter for general adult medical examination without abnormal findings: Secondary | ICD-10-CM

## 2015-08-09 LAB — LIPID PANEL
CHOL/HDL RATIO: 3
Cholesterol: 155 mg/dL (ref 0–200)
HDL: 48 mg/dL (ref >39.00)
LDL Cholesterol: 82 mg/dL (ref 0–99)
NONHDL: 106.85
Triglycerides: 125 mg/dL (ref 0.0–149.0)
VLDL: 25 mg/dL (ref 0.0–40.0)

## 2015-08-09 LAB — CBC WITH DIFFERENTIAL/PLATELET
BASOS ABS: 0 10*3/uL (ref 0.0–0.1)
Basophils Relative: 0.2 % (ref 0.0–3.0)
EOS ABS: 0.2 10*3/uL (ref 0.0–0.7)
Eosinophils Relative: 2.3 % (ref 0.0–5.0)
HEMATOCRIT: 44.2 % (ref 39.0–52.0)
Hemoglobin: 14.9 g/dL (ref 13.0–17.0)
LYMPHS PCT: 20.5 % (ref 12.0–46.0)
Lymphs Abs: 1.8 10*3/uL (ref 0.7–4.0)
MCHC: 33.8 g/dL (ref 30.0–36.0)
MCV: 87.6 fl (ref 78.0–100.0)
MONOS PCT: 7.2 % (ref 3.0–12.0)
Monocytes Absolute: 0.6 10*3/uL (ref 0.1–1.0)
Neutro Abs: 6.1 10*3/uL (ref 1.4–7.7)
Neutrophils Relative %: 69.8 % (ref 43.0–77.0)
Platelets: 248 10*3/uL (ref 150.0–400.0)
RBC: 5.05 Mil/uL (ref 4.22–5.81)
RDW: 13.2 % (ref 11.5–15.5)
WBC: 8.7 10*3/uL (ref 4.0–10.5)

## 2015-08-09 LAB — BASIC METABOLIC PANEL
BUN: 21 mg/dL (ref 6–23)
CALCIUM: 9.4 mg/dL (ref 8.4–10.5)
CO2: 26 mEq/L (ref 19–32)
Chloride: 105 mEq/L (ref 96–112)
Creatinine, Ser: 0.88 mg/dL (ref 0.40–1.50)
GFR: 92.23 mL/min (ref >60.00)
Glucose, Bld: 109 mg/dL — ABNORMAL HIGH (ref 70–99)
Potassium: 3.8 mEq/L (ref 3.5–5.1)
SODIUM: 138 meq/L (ref 135–145)

## 2015-08-09 LAB — HEPATIC FUNCTION PANEL
ALBUMIN: 4.2 g/dL (ref 3.5–5.2)
ALK PHOS: 85 U/L (ref 39–117)
ALT: 25 U/L (ref 0–53)
AST: 20 U/L (ref 0–37)
BILIRUBIN DIRECT: 0.2 mg/dL (ref 0.0–0.3)
TOTAL PROTEIN: 6.6 g/dL (ref 6.0–8.3)
Total Bilirubin: 1 mg/dL (ref 0.2–1.2)

## 2015-08-09 LAB — POC URINALSYSI DIPSTICK (AUTOMATED)
Bilirubin, UA: NEGATIVE
Glucose, UA: NEGATIVE
KETONES UA: NEGATIVE
LEUKOCYTES UA: NEGATIVE
Nitrite, UA: NEGATIVE
PH UA: 5.5
PROTEIN UA: NEGATIVE
SPEC GRAV UA: 1.02
UROBILINOGEN UA: 1

## 2015-08-09 LAB — TSH: TSH: 1.02 u[IU]/mL (ref 0.35–4.50)

## 2015-08-09 LAB — PSA: PSA: 0.17 ng/mL (ref 0.10–4.00)

## 2015-08-11 ENCOUNTER — Other Ambulatory Visit: Payer: Self-pay | Admitting: Family Medicine

## 2015-08-16 ENCOUNTER — Encounter: Payer: Self-pay | Admitting: Gastroenterology

## 2015-08-16 ENCOUNTER — Encounter: Payer: Medicare Other | Admitting: Family Medicine

## 2015-09-08 ENCOUNTER — Encounter: Payer: Self-pay | Admitting: Family Medicine

## 2015-09-08 ENCOUNTER — Ambulatory Visit (INDEPENDENT_AMBULATORY_CARE_PROVIDER_SITE_OTHER): Payer: Medicare Other | Admitting: Family Medicine

## 2015-09-08 VITALS — BP 136/84 | HR 73 | Temp 97.9°F | Ht 67.25 in | Wt 218.8 lb

## 2015-09-08 DIAGNOSIS — R319 Hematuria, unspecified: Secondary | ICD-10-CM | POA: Diagnosis not present

## 2015-09-08 DIAGNOSIS — R6889 Other general symptoms and signs: Secondary | ICD-10-CM

## 2015-09-08 DIAGNOSIS — Z0001 Encounter for general adult medical examination with abnormal findings: Secondary | ICD-10-CM | POA: Diagnosis not present

## 2015-09-08 LAB — URINALYSIS, MICROSCOPIC ONLY

## 2015-09-08 MED ORDER — TETANUS-DIPHTH-ACELL PERTUSSIS 5-2.5-18.5 LF-MCG/0.5 IM SUSP: 0.5000 mL | Freq: Once | INTRAMUSCULAR | 0 refills | Status: AC

## 2015-09-08 MED ORDER — HYDROCODONE-ACETAMINOPHEN 5-325 MG PO TABS: ORAL_TABLET | ORAL | 0 refills | Status: DC

## 2015-09-08 NOTE — Patient Instructions (Addendum)
Get your flu in October Tetanus shot  Drop off urine before you leave

## 2015-09-08 NOTE — Progress Notes (Signed)
Phone: [phone]  Subjective:  Patient presents today for their annual physical. Chief complaint-noted.   See problem oriented charting- ROS- full  review of systems was completed and negative including No chest pain or shortness of breath. No headache or blurry vision.   The following were reviewed and entered/updated in epic: Past Medical History:  Diagnosis Date  . Adenomatous polyp of colon   . Allergy    seasonal  . Arthritis   . BPH (benign prostatic hyperplasia)   . Diverticulosis 09/2010   episode of diverticulitis  . GERD (gastroesophageal reflux disease)   . Hyperlipidemia   . Hypertension   . Low back pain    Patient Active Problem List   Diagnosis Date Noted  . Arthritis of right ankle 02/11/2014    Priority: Medium  . BPH (benign prostatic hyperplasia) 12/17/2006    Priority: Medium  . Hyperlipidemia 08/19/2006    Priority: Medium  . Essential hypertension 08/19/2006    Priority: Medium  . Allergic rhinitis 02/11/2014    Priority: Low  . Family history of melanoma 02/11/2014    Priority: Low  . GERD (gastroesophageal reflux disease) 02/11/2014    Priority: Low  . Former smoker 02/11/2014    Priority: Low  . INSOMNIA, CHRONIC 07/01/2009    Priority: Low  . Actinic keratosis 12/17/2006    Priority: Low  . History of colonic polyps 08/26/2006    Priority: Low   Past Surgical History:  Procedure Laterality Date  . ANKLE FRACTURE SURGERY     chronic pain as result  . COLONOSCOPY    . HERNIA REPAIR     jan 2012, [facility]    Family History  Problem Relation Age of Onset  . Cancer Father     melanoma, cousin also died of it  . Cancer Brother     melanoma  . Colon polyps Sister   . Cancer Paternal Uncle     Colon cancer  . Colon cancer Paternal Uncle     Medications- reviewed and updated Current Outpatient Prescriptions  Medication Sig Dispense Refill  . amLODipine-benazepril (LOTREL) 5-20 MG capsule TAKE 1 CAPSULE BY MOUTH DAILY. 90  capsule 2  . aspirin 81 MG tablet Take 81 mg by mouth daily.      Marland Kitchen atorvastatin (LIPITOR) 40 MG tablet TAKE 1 TABLET (40 MG TOTAL) BY MOUTH DAILY. 90 tablet 3  . fexofenadine-pseudoephedrine (ALLEGRA-D) 60-120 MG per tablet Take 1 tablet by mouth 2 (two) times daily as needed. Reported on 02/15/2015    . finasteride (PROSCAR) 5 MG tablet TAKE 1 TABLET (5 MG TOTAL) BY MOUTH DAILY. 90 tablet 3  . fluticasone (FLONASE) 50 MCG/ACT nasal spray PLACE 2 SPRAYS INTO THE NOSE DAILY. 16 g 5  . HYDROcodone-acetaminophen (NORCO/VICODIN) 5-325 MG per tablet TAKE 1/2-1 TABLET BY MOUTH EVERY 8 HOURS AS NEEDED FOR MODERATE PAIN. Max 5 per week. Goal to last through 02/11/14 45 tablet 0  . meloxicam (MOBIC) 15 MG tablet TAKE 1 TABLET (15 MG TOTAL) BY MOUTH DAILY. 90 tablet 0  . omeprazole (PRILOSEC) 20 MG capsule TAKE 1 CAPSULE (20 MG TOTAL) BY MOUTH DAILY. 90 capsule 2   No current facility-administered medications for this visit.     Allergies-reviewed and updated No Known Allergies  Social History   Social History  . Marital status: Married    Spouse name: N/A  . Number of children: N/A  . Years of education: N/A   Social History Main Topics  . Smoking status: Former Smoker  Packs/day: 0.25    Years: 10.00    Types: Cigarettes    Quit date: 03/23/1990  . Smokeless tobacco: Never Used     Comment: quit in 1992  . Alcohol use 3.0 oz/week    2 Glasses of wine, 3 Cans of beer per week  . Drug use: No  . Sexual activity: Yes   Other Topics Concern  . None   Social History Narrative   Married >30 years in 2016. No children. 2 dogs.       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 Oropharynx normal Neck: no thyromegaly CV: RRR  no murmurs rubs or gallops Lungs: CTAB no crackles, wheeze, rhonchi Abdomen: soft/nontender/nondistended/normal bowel sounds. No rebound or guarding.  Rectal: normal tone, normal sized prostate, no masses or tenderness Ext: no edema, some pain with right ankle palpation Skin: warm, dry Neuro: grossly normal, moves all extremities, PERRLA  Assessment/Plan:  65 y.o. male presenting for annual physical.  Health Maintenance counseling: 1. Anticipatory guidance: Patient counseled regarding regular dental exams, eye exams, wearing seatbelts.  2. Risk factor reduction:  Advised patient of need for regular exercise (walking an hour 5 days) and diet rich and fruits and vegetables to reduce risk of heart attack and stroke.  3. Immunizations/screenings/ancillary studies Immunization History  Administered Date(s) Administered  . Influenza Split 11/21/2011  . Influenza Whole 11/18/2008, 01/11/2010  . Influenza,inj,Quad PF,36+ Mos 11/11/2012, 11/18/2013, 12/07/2014  . Pneumococcal Conjugate-13 02/15/2015  . Td 01/15/2005  . Zoster 07/14/2012   Health Maintenance Due  Topic Date Due  . TETANUS/TDAP - printed for patient to take to pharmacy 01/16/2015  . INFLUENZA VACCINE - advised, he will get in october 08/16/2015   4. Prostate cancer screening- low risk based off PSA trend (of note on finasteride). Rectal reassuring   Lab Results  Component Value Date   PSA 0.17 08/09/2015   PSA 0.24 07/07/2012   PSA 0.22 04/09/2011   5. Colon cancer screening - 10/2015 due with family history. Just got his letter.  6. Skin cancer screening- [place] dermatologly yearly with family history  Status of chronic or acute concerns   HLD S: well controlled on lipitor 40mg . No myalgias.  Lab Results  Component Value Date   CHOL 155 08/09/2015   HDL 48.00 08/09/2015   LDLCALC 82 08/09/2015   LDLDIRECT 90.0 02/11/2014   TRIG 125.0 08/09/2015   CHOLHDL 3 08/09/2015   A/P: continue current meds  HTN S:  controlled. On amlodipine benazepril 5-20mg  BP Readings from Last 3 Encounters:  09/08/15 136/84  02/15/15 130/68  08/12/14 (!) 144/84  A/P:Continue current meds:  Doing well on repeat  BPH S: finasteride 5mg - as long as beer cut down nocturia once a night A/P: continue current medication  Right ankle arthritis S: fracture years ago- hronic pain on mobic daily. GFR normal. Very sparing hydrocodone. Missed one dose last week and pain was horrible A/P: discussed cardiac risks and kidney risks. Benefits outweigh risks given severity of pain off of mobic. Small # written again today  Mom fell in February- moved in with mom short term- and ate too much. Started  wlaking afterwards and now down 2 lbs.   Return in about 6 months (around 03/10/2016).  Orders Placed This Encounter  Procedures  . Urine Microscopic Only    Meds ordered this encounter  Medications  . Tdap (BOOSTRIX) 5-2.5-18.5 LF-MCG/0.5 injection    Sig: Inject 0.5 mLs into the muscle once.    Dispense:  0.5 mL    Refill:  0  . HYDROcodone-acetaminophen (NORCO/VICODIN) 5-325 MG tablet    Sig: TAKE 1/2-1 TABLET BY MOUTH EVERY 8 HOURS AS NEEDED FOR MODERATE PAIN. Max 5 per week.    Dispense:  45 tablet    Refill:  0    Return precautions advised.   Garret Reddish, MD

## 2015-09-08 NOTE — Progress Notes (Signed)
Pre visit review using our clinic review tool, if applicable. No additional management support is needed unless otherwise documented below in the visit note. 

## 2015-11-06 ENCOUNTER — Other Ambulatory Visit: Payer: Self-pay | Admitting: Family Medicine

## 2015-11-08 NOTE — Telephone Encounter (Signed)
Yes thanks 

## 2015-12-11 ENCOUNTER — Other Ambulatory Visit: Payer: Self-pay | Admitting: Family Medicine

## 2015-12-20 ENCOUNTER — Telehealth: Payer: Self-pay | Admitting: Family Medicine

## 2015-12-20 ENCOUNTER — Ambulatory Visit (INDEPENDENT_AMBULATORY_CARE_PROVIDER_SITE_OTHER): Payer: Medicare Other

## 2015-12-20 DIAGNOSIS — Z23 Encounter for immunization: Secondary | ICD-10-CM | POA: Diagnosis not present

## 2015-12-20 NOTE — Telephone Encounter (Signed)
Error

## 2016-01-21 ENCOUNTER — Other Ambulatory Visit: Payer: Self-pay | Admitting: Family Medicine

## 2016-02-04 ENCOUNTER — Other Ambulatory Visit: Payer: Self-pay | Admitting: Family Medicine

## 2016-02-16 ENCOUNTER — Ambulatory Visit (INDEPENDENT_AMBULATORY_CARE_PROVIDER_SITE_OTHER): Payer: Medicare Other

## 2016-02-16 DIAGNOSIS — Z23 Encounter for immunization: Secondary | ICD-10-CM

## 2016-02-23 ENCOUNTER — Encounter: Payer: Self-pay | Admitting: Family Medicine

## 2016-02-23 ENCOUNTER — Ambulatory Visit (INDEPENDENT_AMBULATORY_CARE_PROVIDER_SITE_OTHER): Payer: Medicare Other | Admitting: Family Medicine

## 2016-02-23 VITALS — BP 148/88 | HR 75 | Temp 98.4°F | Ht 67.25 in | Wt 220.4 lb

## 2016-02-23 DIAGNOSIS — B9689 Other specified bacterial agents as the cause of diseases classified elsewhere: Secondary | ICD-10-CM | POA: Diagnosis not present

## 2016-02-23 DIAGNOSIS — J329 Chronic sinusitis, unspecified: Secondary | ICD-10-CM

## 2016-02-23 DIAGNOSIS — I10 Essential (primary) hypertension: Secondary | ICD-10-CM

## 2016-02-23 DIAGNOSIS — J40 Bronchitis, not specified as acute or chronic: Secondary | ICD-10-CM | POA: Diagnosis not present

## 2016-02-23 MED ORDER — AMOXICILLIN-POT CLAVULANATE 875-125 MG PO TABS: 1.0000 | ORAL_TABLET | Freq: Two times a day (BID) | ORAL | 0 refills | Status: DC

## 2016-02-23 MED ORDER — ALBUTEROL SULFATE HFA 108 (90 BASE) MCG/ACT IN AERS: 2.0000 | INHALATION_SPRAY | Freq: Four times a day (QID) | RESPIRATORY_TRACT | 0 refills | Status: DC | PRN

## 2016-02-23 NOTE — Progress Notes (Signed)
PCP: Garret Reddish, MD  Subjective:  Matthew Watson is a 66 y.o. year old very pleasant male patient who presents with  symptoms including nasal congestion, sore throat, cough, chest congestion.  - does have wheeze as well -started: 11 days ago, symptoms are improving mildly -previous treatments:  mucinex DM. Allegra--> not sure if has decongestant in it -sick contacts/travel/risks: denies flu exposure.   ROS-denies fever, NVD, tooth pain. Denies significant shortness of breath, does admit to mild symptoms of SOB with coughing spell  Pertinent Past Medical History-  Patient Active Problem List   Diagnosis Date Noted  . Arthritis of right ankle 02/11/2014    Priority: Medium  . BPH (benign prostatic hyperplasia) 12/17/2006    Priority: Medium  . Hyperlipidemia 08/19/2006    Priority: Medium  . Essential hypertension 08/19/2006    Priority: Medium  . Allergic rhinitis 02/11/2014    Priority: Low  . Family history of melanoma 02/11/2014    Priority: Low  . GERD (gastroesophageal reflux disease) 02/11/2014    Priority: Low  . Former smoker 02/11/2014    Priority: Low  . INSOMNIA, CHRONIC 07/01/2009    Priority: Low  . Actinic keratosis 12/17/2006    Priority: Low  . History of colonic polyps 08/26/2006    Priority: Low    Medications- reviewed  Current Outpatient Prescriptions  Medication Sig Dispense Refill  . amLODipine-benazepril (LOTREL) 5-20 MG capsule TAKE 1 CAPSULE BY MOUTH DAILY. 90 capsule 2  . aspirin 81 MG tablet Take 81 mg by mouth daily.      Marland Kitchen atorvastatin (LIPITOR) 40 MG tablet TAKE 1 TABLET (40 MG TOTAL) BY MOUTH DAILY. 90 tablet 3  . finasteride (PROSCAR) 5 MG tablet TAKE 1 TABLET (5 MG TOTAL) BY MOUTH DAILY. 90 tablet 3  . fluticasone (FLONASE) 50 MCG/ACT nasal spray INSTILL 2 SPRAYS IN EACH NOSTRIL DAILY 16 g 4  . HYDROcodone-acetaminophen (NORCO/VICODIN) 5-325 MG tablet TAKE 1/2-1 TABLET BY MOUTH EVERY 8 HOURS AS NEEDED FOR MODERATE PAIN. Max 5 per  week. 45 tablet 0  . meloxicam (MOBIC) 15 MG tablet TAKE 1 TABLET (15 MG TOTAL) BY MOUTH DAILY. 90 tablet 1  . omeprazole (PRILOSEC) 20 MG capsule TAKE 1 CAPSULE (20 MG TOTAL) BY MOUTH DAILY. 90 capsule 1   No current facility-administered medications for this visit.     Objective: BP (!) 148/88 (BP Location: Left Arm, Patient Position: Sitting, Cuff Size: Large)   Pulse 75   Temp 98.4 F (36.9 C) (Oral)   Ht 5' 7.25" (1.708 m)   Wt 220 lb 6.4 oz (100 kg)   SpO2 96%   BMI 34.26 kg/m  Gen: NAD, resting comfortably HEENT: Turbinates erythematous, TM normal, pharynx mildly erythematous with no tonsilar exudate or edema, no sinus tenderness CV: RRR no murmurs rubs or gallops Lungs: Occasional wheeze. Rhonchi lower lobes. no crackles Ext: no edema Skin: warm, dry, no rash  Assessment/Plan:  Bronchitis History and exam today are suggestive of viral infection most likely due to bronchitis. Although it is improving and possible this could be more of an upper respiratory infectoin  Symptomatic treatment with: albuterol inhaler for cough/wheeze  Discussed prednisone:with blood pressure up, hold off on this. Also need to work on obesity.   I am concerned with his sinus discharge for 11 days and congestion about potential sinus infection- if he is still blowing out green/yellow chunks in the next 2-3 days and not improving he will pick up augmentin antibiotic (printed)   We  discussed signs that alternate bacterial infection may have developed particularly fever or shortness of breath and reasons for follow up (symptoms worsen, last past expected time frame, new concerns arise).  Likely course of 3-6 weeks for bronchitis symptoms (sinusitis would usually improve within 72 hours on antibiotics). Patient is contagious and advised good handwashing and consideration of mask If going to be in public places.   Hypertension- BP elevated BP Readings from Last 3 Encounters:  02/23/16 (!) 148/88   09/08/15 136/84  02/15/15 130/68  continue lotrel. Avoid decongestants.   Meds ordered this encounter  Medications  . amoxicillin-clavulanate (AUGMENTIN) 875-125 MG tablet    Sig: Take 1 tablet by mouth 2 (two) times daily.    Dispense:  14 tablet    Refill:  0  . albuterol (PROVENTIL HFA;VENTOLIN HFA) 108 (90 Base) MCG/ACT inhaler    Sig: Inhale 2 puffs into the lungs every 6 (six) hours as needed for wheezing or shortness of breath.    Dispense:  1 Inhaler    Refill:  0    Garret Reddish, MD

## 2016-02-23 NOTE — Patient Instructions (Signed)
  Bronchitis History and exam today are suggestive of viral infection most likely due to bronchitis. Although it is improving and possible this could be more of an upper respiratory infectoin  Symptomatic treatment with: albuterol inhaler for cough/wheeze  Discussed prednisone:with blood pressure up, hold off on this. Also need to work on obesity.   I am concerned with his sinus discharge for 11 days and congestion about potential sinus infection- if he is still blowing out green/yellow chunks in the next 2-3 days and not improving he will pick up augmentin antibiotic (printed)   We discussed signs that alternate bacterial infection may have developed particularly fever or shortness of breath and reasons for follow up (symptoms worsen, last past expected time frame, new concerns arise).  Likely course of 3-6 weeks for bronchitis symptoms (sinusitis would usually improve within 72 hours on antibiotics). Patient is contagious and advised good handwashing and consideration of mask If going to be in public places.   Meds ordered this encounter  Medications  . amoxicillin-clavulanate (AUGMENTIN) 875-125 MG tablet    Sig: Take 1 tablet by mouth 2 (two) times daily.    Dispense:  14 tablet    Refill:  0  . albuterol (PROVENTIL HFA;VENTOLIN HFA) 108 (90 Base) MCG/ACT inhaler    Sig: Inhale 2 puffs into the lungs every 6 (six) hours as needed for wheezing or shortness of breath.    Dispense:  1 Inhaler    Refill:  0

## 2016-02-23 NOTE — Progress Notes (Signed)
Pre visit review using our clinic review tool, if applicable. No additional management support is needed unless otherwise documented below in the visit note. 

## 2016-03-01 ENCOUNTER — Ambulatory Visit (INDEPENDENT_AMBULATORY_CARE_PROVIDER_SITE_OTHER): Payer: Medicare Other | Admitting: Adult Health

## 2016-03-01 ENCOUNTER — Telehealth: Payer: Self-pay | Admitting: Family Medicine

## 2016-03-01 ENCOUNTER — Encounter: Payer: Self-pay | Admitting: Adult Health

## 2016-03-01 VITALS — BP 118/82 | Temp 98.3°F | Wt 218.8 lb

## 2016-03-01 DIAGNOSIS — J069 Acute upper respiratory infection, unspecified: Secondary | ICD-10-CM | POA: Diagnosis not present

## 2016-03-01 MED ORDER — HYDROCODONE-HOMATROPINE 5-1.5 MG/5ML PO SYRP
5.0000 mL | ORAL_SOLUTION | Freq: Three times a day (TID) | ORAL | 0 refills | Status: DC | PRN
Start: 2016-03-01 — End: 2016-08-06

## 2016-03-01 MED ORDER — PREDNISONE 10 MG PO TABS: ORAL_TABLET | ORAL | 0 refills | Status: DC

## 2016-03-01 NOTE — Telephone Encounter (Signed)
Pt scheduled for this afternoon with Dorothyann Peng, NP.

## 2016-03-01 NOTE — Progress Notes (Signed)
Subjective:    Patient ID: Matthew Watson, male    DOB: 03-Jan-1951, 66 y.o.   MRN: [id number]  HPI  66 year old male who presents to the office today for URI like symptoms. He was seen by his PCP, Dr. Rushie Chestnut on 02/23/2016 with symptoms include nasal congestion, sore throat, cough, chest congestion, he also did have wheezes. At that time symptoms were improving mildly diagnosed with a sinus infection and bronchitis he was, and was treated with Augmentin and albuterol inhaler. He reports that his cough has become slightly improved and is not coughing up yellow mucus any longer. Unfortunately his sinusitis symptoms have become worse. He feels more congested, has 3 sinus pain and pressure as well as headaches. He started Augmentin 3 days ago  His cough continues to keep him up at night and is unable to sleep  Denies any recent fevers, chills, shortness of breath, chest pain, nausea, vomiting, or diarrhea.  Review of Systems  All other systems reviewed and are negative. See HPI  Past Medical History:  Diagnosis Date  . Adenomatous polyp of colon   . Allergy    seasonal  . Arthritis   . BPH (benign prostatic hyperplasia)   . Diverticulosis 09/2010   episode of diverticulitis  . GERD (gastroesophageal reflux disease)   . Hyperlipidemia   . Hypertension   . Low back pain     Social History   Social History  . Marital status: Married    Spouse name: N/A  . Number of children: N/A  . Years of education: N/A   Occupational History  . Not on file.   Social History Main Topics  . Smoking status: Former Smoker    Packs/day: 0.25    Years: 10.00    Types: Cigarettes    Quit date: 03/23/1990  . Smokeless tobacco: Never Used     Comment: quit in 1992  . Alcohol use 3.0 oz/week    2 Glasses of wine, 3 Cans of beer per week  . Drug use: No  . Sexual activity: Yes   Other Topics Concern  . Not on file   Social History Narrative   Married >30 years in 2016. No children.  2 dogs.       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 ANKLE FRACTURE SURGERY     chronic pain as result  . COLONOSCOPY    . HERNIA REPAIR     jan 2012, [place]    Family History  Problem Relation Age of Onset  . Cancer Father     melanoma, cousin also died of it  . Cancer Brother     melanoma  . Colon polyps Sister   . Cancer Paternal Uncle     Colon cancer  . Colon cancer Paternal Uncle     No Known Allergies  Current Outpatient Prescriptions on File Prior to Visit  Medication Sig Dispense Refill  . albuterol (PROVENTIL HFA;VENTOLIN HFA) 108 (90 Base) MCG/ACT inhaler Inhale 2 puffs into the lungs every 6 (six) hours as needed for wheezing or shortness of breath. 1 Inhaler 0  . amLODipine-benazepril (LOTREL) 5-20 MG capsule TAKE 1 CAPSULE BY MOUTH DAILY. 90 capsule 2  . amoxicillin-clavulanate (AUGMENTIN) 875-125 MG tablet Take 1 tablet by mouth 2 (two) times daily. 14 tablet 0  . aspirin 81 MG tablet Take 81 mg  by mouth daily.      Marland Kitchen atorvastatin (LIPITOR) 40 MG tablet TAKE 1 TABLET (40 MG TOTAL) BY MOUTH DAILY. 90 tablet 3  . finasteride (PROSCAR) 5 MG tablet TAKE 1 TABLET (5 MG TOTAL) BY MOUTH DAILY. 90 tablet 3  . fluticasone (FLONASE) 50 MCG/ACT nasal spray INSTILL 2 SPRAYS IN EACH NOSTRIL DAILY 16 g 4  . HYDROcodone-acetaminophen (NORCO/VICODIN) 5-325 MG tablet TAKE 1/2-1 TABLET BY MOUTH EVERY 8 HOURS AS NEEDED FOR MODERATE PAIN. Max 5 per week. 45 tablet 0  . meloxicam (MOBIC) 15 MG tablet TAKE 1 TABLET (15 MG TOTAL) BY MOUTH DAILY. 90 tablet 1  . omeprazole (PRILOSEC) 20 MG capsule TAKE 1 CAPSULE (20 MG TOTAL) BY MOUTH DAILY. 90 capsule 1   No current facility-administered medications on file prior to visit.     BP 118/82   Temp 98.3 F (36.8 C) (Oral)   Wt 218 lb 12.8 oz (99.2 kg)   BMI 34.01 kg/m        Objective:   Physical Exam  Constitutional: He  is oriented to person, place, and time. He appears well-developed and well-nourished. No distress.  HENT:  Head: Normocephalic and atraumatic.  Right Ear: Hearing, tympanic membrane, external ear and ear canal normal.  Left Ear: Hearing, tympanic membrane, external ear and ear canal normal.  Nose: Mucosal edema and rhinorrhea present. Right sinus exhibits frontal sinus tenderness. Left sinus exhibits frontal sinus tenderness.  Mouth/Throat: Uvula is midline, oropharynx is clear and moist and mucous membranes are normal. No oropharyngeal exudate.  Eyes: Conjunctivae and EOM are normal. Pupils are equal, round, and reactive to light. Right eye exhibits no discharge. Left eye exhibits no discharge. No scleral icterus.  Neck: Normal range of motion. Neck supple. No thyromegaly present.  Cardiovascular: Normal rate, regular rhythm, normal heart sounds and intact distal pulses.  Exam reveals no gallop and no friction rub.   No murmur heard. Pulmonary/Chest: Effort normal. No respiratory distress. He has no decreased breath sounds. He has wheezes (Trace wheezes in bilateral upper lobes). He has no rhonchi. He has no rales. He exhibits no tenderness.  Lymphadenopathy:    He has no cervical adenopathy.  Neurological: He is alert and oriented to person, place, and time.  Skin: Skin is warm and dry. No rash noted. He is not diaphoretic. No erythema. No pallor.  Psychiatric: He has a normal mood and affect. His behavior is normal. Judgment and thought content normal.  Nursing note and vitals reviewed.      Assessment & Plan:  1. Upper respiratory tract infection, unspecified type - Continue with antibiotic therapy. Will add prednisone taper as well as Hycodan cough syrup. No concern for pneumonia at this time - predniSONE (DELTASONE) 10 MG tablet; 40 mg x 3 days, 20 mg x 3 days, 10 mg x 3 days  Dispense: 21 tablet; Refill: 0 - HYDROcodone-homatropine (HYCODAN) 5-1.5 MG/5ML syrup; Take 5 mLs by mouth  every 8 (eight) hours as needed for cough.  Dispense: 120 mL; Refill: 0  Dorothyann Peng, NP

## 2016-03-01 NOTE — Telephone Encounter (Signed)
Patient Name: Matthew Watson DOB: [birth year] Initial Comment Caller was diagnosed with upper respiratory infection and sinus infection. His sinuses are getting worse, antibiotics are not helping. Nose is stuffed Nurse Assessment Nurse: Andria Frames, RN, Aeriel Date/Time (Eastern Time): 03/01/2016 2:35:34 PM Confirm and document reason for call. If symptomatic, describe symptoms. ---Caller states, he was diagnosed with upper respiratory infection and sinus infection. His sinuses are getting worse, antibiotics are not helping. Nose is stuffed. His sinuses are shut down he is having a hard time breathing through the nose. His cough has gotten a whole lot better but the sinus infection is getting worse no fever. Does the patient have any new or worsening symptoms? ---Yes Will a triage be completed? ---Yes Related visit to physician within the last 2 weeks? ---Yes Does the PT have any chronic conditions? (i.e. diabetes, asthma, etc.) ---Yes List chronic conditions. ---htn Is this a behavioral health or substance abuse call? ---No Guidelines Guideline Title Affirmed Question Affirmed Notes Sinus Infection on Antibiotic Follow-up Call [1] Taking antibiotic < 72 hours (3 days) AND [2] sinus pain not improved (all triage questions negative) Final Disposition User See Physician within 24 Hours Hensel, RN, Aeriel Comments Nurse upgrade related to pt level of concern. Nurse attempted home care advice and pt is insistent that his sinuses aren't stopped up but that they feel swollen shut. He is not having any active difficulty breathing other than through his nose when stopped up. Referrals REFERRED TO PCP OFFICE Disagree/Comply: Comply

## 2016-03-16 ENCOUNTER — Encounter: Payer: Self-pay | Admitting: Gastroenterology

## 2016-05-04 ENCOUNTER — Encounter: Payer: Self-pay | Admitting: Gastroenterology

## 2016-05-04 ENCOUNTER — Ambulatory Visit (AMBULATORY_SURGERY_CENTER): Payer: Self-pay

## 2016-05-04 VITALS — Ht 67.0 in | Wt 222.6 lb

## 2016-05-04 DIAGNOSIS — Z860100 Personal history of colon polyps, unspecified: Secondary | ICD-10-CM

## 2016-05-04 MED ORDER — SUPREP BOWEL PREP KIT 17.5-3.13-1.6 GM/177ML PO SOLN: 1.0000 | Freq: Once | ORAL | 0 refills | Status: AC

## 2016-05-04 NOTE — Progress Notes (Signed)
No allergies to eggs or soy No past problems with anesthesia No diet meds No home oxygen  Registered emmi 

## 2016-05-08 ENCOUNTER — Other Ambulatory Visit: Payer: Self-pay | Admitting: Family Medicine

## 2016-05-17 ENCOUNTER — Other Ambulatory Visit: Payer: Self-pay | Admitting: Family Medicine

## 2016-05-18 ENCOUNTER — Encounter: Payer: Self-pay | Admitting: Gastroenterology

## 2016-05-18 ENCOUNTER — Ambulatory Visit (AMBULATORY_SURGERY_CENTER): Payer: Medicare Other | Admitting: Gastroenterology

## 2016-05-18 VITALS — BP 137/83 | HR 63 | Temp 98.0°F | Resp 17 | Ht 67.0 in | Wt 222.0 lb

## 2016-05-18 DIAGNOSIS — Z86010 Personal history of colonic polyps: Secondary | ICD-10-CM

## 2016-05-18 DIAGNOSIS — K5730 Diverticulosis of large intestine without perforation or abscess without bleeding: Secondary | ICD-10-CM

## 2016-05-18 MED ORDER — SODIUM CHLORIDE 0.9 % IV SOLN: 500.0000 mL | INTRAVENOUS | Status: DC

## 2016-05-18 NOTE — Progress Notes (Signed)
Report to PACU, RN, vss, BBS= Clear.  

## 2016-05-18 NOTE — Progress Notes (Signed)
Pt's states no medical or surgical changes since previsit or office visit. 

## 2016-05-18 NOTE — Patient Instructions (Signed)

## 2016-05-18 NOTE — Op Note (Signed)
[place] Patient Name: Matthew Watson Procedure Date: 05/18/2016 8:54 AM MRN: [id number] Endoscopist: Milus Banister , MD Age: 66 Referring MD:  Date of Birth: 1950-10-17 Gender: Male Account #: [id number] Procedure:                Colonoscopy Indications:              High risk colon cancer surveillance: Personal                            history of colonic polyps: colonoscopy 2007 1 subCM                            adenoma; colonoscopy 2012 two polyps, one was a                            subCM adenoma Medicines:                Monitored Anesthesia Care Procedure:                Pre-Anesthesia Assessment:                           - Prior to the procedure, a History and Physical                            was performed, and patient medications and                            allergies were reviewed. The patient's tolerance of                            previous anesthesia was also reviewed. The risks                            and benefits of the procedure and the sedation                            options and risks were discussed with the patient.                            All questions were answered, and informed consent                            was obtained. Prior Anticoagulants: The patient has                            taken no previous anticoagulant or antiplatelet                            agents. ASA Grade Assessment: II - A patient with                            mild systemic disease. After reviewing the risks  and benefits, the patient was deemed in                            satisfactory condition to undergo the procedure.                           After obtaining informed consent, the colonoscope                            was passed under direct vision. Throughout the                            procedure, the patient's blood pressure, pulse, and                            oxygen saturations were monitored continuously. The                             Colonoscope was introduced through the anus and                            advanced to the the cecum, identified by                            appendiceal orifice and ileocecal valve. The                            colonoscopy was performed with slight difficulty                            (required change to pediatric colonoscope, see                            below). The patient tolerated the procedure well.                            The quality of the bowel preparation was good. The                            ileocecal valve, appendiceal orifice, and rectum                            were photographed. Scope In: 8:59:46 AM Scope Out: 9:17:47 AM Scope Withdrawal Time: 0 hours 5 minutes 56 seconds  Total Procedure Duration: 0 hours 18 minutes 1 second  Findings:                 Multiple small and large-mouthed diverticula were                            found in the entire colon. There was narrowing adn                            tortuosity of the colon in association with the  diverticular opening. There was evidence of                            diverticular spasm. Required a change to pedictric                            colonoscope to complete the exam.                           The exam was otherwise without abnormality on                            direct and retroflexion views. Complications:            No immediate complications. Estimated blood loss:                            None. Estimated Blood Loss:     Estimated blood loss: none. Impression:               - Moderate diverticulosis in the entire examined                            colon.                           - The examination was otherwise normal on direct                            and retroflexion views.                           - No specimens collected. Recommendation:           - Patient has a contact number available for                             emergencies. The signs and symptoms of potential                            delayed complications were discussed with the                            patient. Return to normal activities tomorrow.                            Written discharge instructions were provided to the                            patient.                           - Resume previous diet.                           - Continue present medications.                           -  Repeat colonoscopy in 10 years for screening. Milus Banister, MD 05/18/2016 9:22:19 AM This report has been signed electronically.

## 2016-05-21 ENCOUNTER — Telehealth: Payer: Self-pay

## 2016-05-21 NOTE — Telephone Encounter (Signed)
  Follow up Call-  Call back number 05/18/2016  Post procedure Call Back phone  # [phone]  Permission to leave phone message Yes  Some recent data might be hidden     Patient questions:  Do you have a fever, pain , or abdominal swelling? No. Pain Score  0 *  Have you tolerated food without any problems? Yes.    Have you been able to return to your normal activities? Yes.    Do you have any questions about your discharge instructions: Diet   No. Medications  No. Follow up visit  No.  Do you have questions or concerns about your Care? No.  Actions: * If pain score is 4 or above: No action needed, pain <4.

## 2016-07-18 ENCOUNTER — Other Ambulatory Visit: Payer: Self-pay | Admitting: Family Medicine

## 2016-08-06 ENCOUNTER — Ambulatory Visit (INDEPENDENT_AMBULATORY_CARE_PROVIDER_SITE_OTHER): Payer: Medicare Other | Admitting: Family Medicine

## 2016-08-06 ENCOUNTER — Encounter: Payer: Self-pay | Admitting: Family Medicine

## 2016-08-06 VITALS — BP 136/78 | HR 68 | Temp 98.4°F | Ht 67.0 in | Wt 218.4 lb

## 2016-08-06 DIAGNOSIS — M79672 Pain in left foot: Secondary | ICD-10-CM | POA: Diagnosis not present

## 2016-08-06 NOTE — Progress Notes (Addendum)
Subjective:  Matthew Watson is a 66 y.o. year old very pleasant male patient who presents for/with See problem oriented charting ROS- no fever, chills. Intermittent redness over foot without significant warmth   Past Medical History-  Patient Active Problem List   Diagnosis Date Noted  . Arthritis of right ankle 02/11/2014    Priority: Medium  . BPH (benign prostatic hyperplasia) 12/17/2006    Priority: Medium  . Hyperlipidemia 08/19/2006    Priority: Medium  . Essential hypertension 08/19/2006    Priority: Medium  . Allergic rhinitis 02/11/2014    Priority: Low  . Family history of melanoma 02/11/2014    Priority: Low  . GERD (gastroesophageal reflux disease) 02/11/2014    Priority: Low  . Former smoker 02/11/2014    Priority: Low  . INSOMNIA, CHRONIC 07/01/2009    Priority: Low  . Actinic keratosis 12/17/2006    Priority: Low  . History of colonic polyps 08/26/2006    Priority: Low    Medications- reviewed and updated Current Outpatient Prescriptions  Medication Sig Dispense Refill  . amLODipine-benazepril (LOTREL) 5-20 MG capsule TAKE 1 CAPSULE BY MOUTH DAILY. 90 capsule 1  . aspirin 81 MG tablet Take 81 mg by mouth daily.      Marland Kitchen atorvastatin (LIPITOR) 40 MG tablet TAKE 1 TABLET (40 MG TOTAL) BY MOUTH DAILY. 90 tablet 2  . fexofenadine (ALLEGRA) 180 MG tablet Take 180 mg by mouth daily.    . finasteride (PROSCAR) 5 MG tablet TAKE 1 TABLET (5 MG TOTAL) BY MOUTH DAILY. 90 tablet 2  . fluticasone (FLONASE) 50 MCG/ACT nasal spray INSTILL 2 SPRAYS IN EACH NOSTRIL ONCE DAILY 16 g 3  . HYDROcodone-acetaminophen (NORCO/VICODIN) 5-325 MG tablet TAKE 1/2-1 TABLET BY MOUTH EVERY 8 HOURS AS NEEDED FOR MODERATE PAIN. Max 5 per week. 45 tablet 0  . meloxicam (MOBIC) 15 MG tablet TAKE 1 TABLET (15 MG TOTAL) BY MOUTH DAILY. 90 tablet 1  . omeprazole (PRILOSEC) 20 MG capsule TAKE 1 CAPSULE (20 MG TOTAL) BY MOUTH DAILY. 90 capsule 1   Objective: BP 136/78 (BP Location: Left Arm,  Patient Position: Sitting, Cuff Size: Large)   Pulse 68   Temp 98.4 F (36.9 C) (Oral)   Ht 5\' 7"  (1.702 m)   Wt 218 lb 6.4 oz (99.1 kg)   SpO2 95%   BMI 34.21 kg/m  Gen: NAD, resting comfortably CV: RRR no murmurs rubs or gallops Lungs: CTAB no crackles, wheeze, rhonchi Abdomen: soft/nontender/nondistended/normal bowel sounds. obese Ext: no edema on left ankle, mild edema into foot Skin: warm, dry  MSK:  Pain with movement of first TMT joint. Tarsometatarsal squeeze and pronaion and abduction also produce pain. Mild warmth in this area. Denies pain otherwise. Is able to walk with slight limp. can stand on his toes without difficulty and only minimal pain  Assessment/Plan:  Left foot pain - Plan: Ambulatory referral to Sports Medicine S: patient has been dealing with left foot pain for 2 weeks. Pain in medial midfoot. Pain up to 6-7/10 with walking. At night sometimes will throb but most of the day if relaxing- minimal pain. Started after a 45 minute walk which is normal for him- right achilles also hurt.that got better with 2-3 days off but the pain in the foot did not (left foot). Some swelling of the foot. Appears slightly red at times but not today.  Wearing shoes bothers him so has been wearing crocs.   Has had chronic ankle pain in right foot- but this  has not been worse recently.   Takes meloxicam on daily basis and only helps minimally.  A/P: Midfoot pain at TMT joint but without clear trauma or injury- started after one of his typical walks. Considered course or prednisone and x-rays today but with location of pain opted for sports medicine referral in case of Lisfranc injury (though able to stand on left foot and raise up on heel without significant pain pointing away from this). Was able to set patient up with Dr. Paulla Fore on Wednesday and advised relative res tuntil that time.   Patient Instructions  See Dr. Paulla Fore on Wednesday.   Take it easy on the foot- would ice 20 minutes  3x a day until you see Dr. Paulla Fore   Orders Placed This Encounter  Procedures  . Ambulatory referral to Sports Medicine    Referral Priority:   Routine    Referral Type:   Consultation    Referred to Provider:   Gerda Diss, DO    Number of Visits Requested:   1   Return precautions advised.  Garret Reddish, MD

## 2016-08-06 NOTE — Patient Instructions (Signed)
See Dr. Paulla Fore on Wednesday.   Take it easy on the foot- would ice 20 minutes 3x a day until you see Dr. Paulla Fore

## 2016-08-08 ENCOUNTER — Encounter: Payer: Self-pay | Admitting: Sports Medicine

## 2016-08-08 ENCOUNTER — Ambulatory Visit (INDEPENDENT_AMBULATORY_CARE_PROVIDER_SITE_OTHER): Payer: Medicare Other

## 2016-08-08 ENCOUNTER — Ambulatory Visit (INDEPENDENT_AMBULATORY_CARE_PROVIDER_SITE_OTHER): Payer: Medicare Other | Admitting: Sports Medicine

## 2016-08-08 VITALS — BP 136/88 | HR 68 | Ht 67.0 in | Wt 217.6 lb

## 2016-08-08 DIAGNOSIS — M79672 Pain in left foot: Secondary | ICD-10-CM

## 2016-08-08 LAB — CBC
HEMATOCRIT: 42.9 % (ref 39.0–52.0)
Hemoglobin: 14.9 g/dL (ref 13.0–17.0)
MCHC: 34.7 g/dL (ref 30.0–36.0)
MCV: 87.8 fl (ref 78.0–100.0)
PLATELETS: 230 10*3/uL (ref 150.0–400.0)
RBC: 4.89 Mil/uL (ref 4.22–5.81)
RDW: 13.1 % (ref 11.5–15.5)
WBC: 8.8 10*3/uL (ref 4.0–10.5)

## 2016-08-08 LAB — COMPREHENSIVE METABOLIC PANEL
ALT: 22 U/L (ref 0–53)
AST: 18 U/L (ref 0–37)
Albumin: 4.2 g/dL (ref 3.5–5.2)
Alkaline Phosphatase: 80 U/L (ref 39–117)
BUN: 17 mg/dL (ref 6–23)
CALCIUM: 9.5 mg/dL (ref 8.4–10.5)
CHLORIDE: 105 meq/L (ref 96–112)
CO2: 28 meq/L (ref 19–32)
Creatinine, Ser: 0.94 mg/dL (ref 0.40–1.50)
GFR: 85.21 mL/min (ref >60.00)
Glucose, Bld: 99 mg/dL (ref 70–99)
Potassium: 4 mEq/L (ref 3.5–5.1)
Sodium: 138 mEq/L (ref 135–145)
Total Bilirubin: 0.5 mg/dL (ref 0.2–1.2)
Total Protein: 7 g/dL (ref 6.0–8.3)

## 2016-08-08 LAB — URIC ACID: URIC ACID, SERUM: 4.9 mg/dL (ref 4.0–7.8)

## 2016-08-08 NOTE — Patient Instructions (Signed)

## 2016-08-08 NOTE — Progress Notes (Signed)
OFFICE VISIT NOTE Matthew Watson. [place], Matthew Watson at [place]  ARCENIO MULLALY - 66 y.o. male MRN [id number]  Date of birth: 1950/10/01  Visit Date: 08/08/2016  PCP: Marin Olp, MD   Referred by: Marin Olp, MD  Burlene Arnt, CMA acting as scribe for Dr. Paulla Fore.  SUBJECTIVE:   Chief Complaint  Patient presents with  . New Patient (Initial Visit)    left foot pain   HPI: As below and per problem based documentation when appropriate.  Pt presents today with complaint of left foot pain. Pain is on the medial aspect of the foot, there is occasional swelling and erythema. PCP has concerns of Lisfranc injury.  Pain started about 2 weeks ago. He thinks this has actually been going on for some time now. He will walk for a little while, feel the pain, and then stop for a week or so, the he will start walking again. He does have some pain when rotating the foot.  Pain started after a 45 minutes walk. He goes for walks on a regular basis and generally has no issues.   The pain is described as aching at night, sharp shooting pain when walking and is rated as 7/10 at its worst.  Worsened with wearing shoes. Foot tends to throb at night. Improves with wearing Crocs.  Therapies tried include : PCP recommended icing the foot for 20 minutes three times a day but he has not been doing that.   Other associated symptoms include: He feels occasional radiating pain into the left calf.   Pt denies fever, chills, night sweats.     Review of Systems  Constitutional: Negative for chills and fever.  Respiratory: Negative for shortness of breath and wheezing.   Cardiovascular: Positive for leg swelling (right leg). Negative for chest pain and palpitations.  Musculoskeletal: Negative for falls.  Neurological: Negative for dizziness, tingling and headaches.  Endo/Heme/Allergies: Does not bruise/bleed easily.    Otherwise per  HPI.  HISTORY & PERTINENT PRIOR DATA:  No specialty comments available. He reports that he quit smoking about 26 years ago. His smoking use included Cigarettes. He has a 2.50 pack-year smoking history. He has never used smokeless tobacco.   Recent Labs  08/08/16 1112  LABURIC 4.9   Medications & Allergies reviewed per EMR Patient Active Problem List   Diagnosis Date Noted  . Arthritis of left foot 08/27/2016  . Allergic rhinitis 02/11/2014  . Arthritis of right ankle 02/11/2014  . Family history of melanoma 02/11/2014  . GERD (gastroesophageal reflux disease) 02/11/2014  . Former smoker 02/11/2014  . INSOMNIA, CHRONIC 07/01/2009  . BPH (benign prostatic hyperplasia) 12/17/2006  . Actinic keratosis 12/17/2006  . History of colonic polyps 08/26/2006  . Hyperlipidemia 08/19/2006  . Essential hypertension 08/19/2006   Past Medical History:  Diagnosis Date  . Adenomatous polyp of colon   . Allergy    seasonal  . Arthritis   . BPH (benign prostatic hyperplasia)   . Diverticulosis 09/2010   episode of diverticulitis  . GERD (gastroesophageal reflux disease)   . Hyperlipidemia   . Hypertension   . Low back pain    Family History  Problem Relation Age of Onset  . Cancer Father        melanoma, cousin also died of it  . Cancer Brother        melanoma  . Colon polyps Sister   . Cancer Paternal Uncle  Colon cancer  . Colon cancer Paternal Uncle    Past Surgical History:  Procedure Laterality Date  . ANKLE FRACTURE SURGERY     chronic pain as result  . COLONOSCOPY    . HERNIA REPAIR     jan 2012, [place]   Social History   Occupational History  . Not on file.   Social History Main Topics  . Smoking status: Former Smoker    Packs/day: 0.25    Years: 10.00    Types: Cigarettes    Quit date: 03/23/1990  . Smokeless tobacco: Never Used     Comment: quit in 1992  . Alcohol use 3.6 oz/week    6 Cans of beer per week  . Drug use: No  . Sexual activity: Yes     OBJECTIVE:  VS:  HT:5\' 7"  (170.2 cm)   WT:217 lb 9.6 oz (98.7 kg)  BMI:34.2    BP:136/88  HR:68bpm  TEMP: ( )  RESP:96 % EXAM: Findings:  WDWN, NAD, Non-toxic appearing Alert & appropriately interactive Not depressed or anxious appearing No increased work of breathing. Pupils are equal. EOM intact without nystagmus No clubbing or cyanosis of the extremities appreciated No significant rashes/lesions/ulcerations overlying the examined area. DP & PT pulses 2+/4.  No significant pretibial edema. Sensation intact to light touch in lower extremities.  Left foot: Overall marked  Swelling across the midfoot with minimal overlying erythema.  He has significant TTP over the lateral aspect of the midfoot with marked pain with forefoot abduction and.  Dorsiflexion to 90 only.  There is a small amount of edema once again more so over the lateral column.   X-rays of the foot reviewed.  Moderate degenerative change  ASSESSMENT & PLAN:     ICD-10-CM   1. Left foot pain M79.672 DG Foot Complete Left    CBC    Comprehensive metabolic panel    Uric acid    MR FOOT LEFT WO CONTRAST   Atypical appearance of presentation.  Further diagnostic evaluation with MRI indicated.  Follow-up after this is obtained.  Lab work for evaluation of gout.  ================================================================= No problem-specific Assessment & Plan notes found for this encounter. ================================================================= Patient Instructions  We are ordering an MRI for you today.  The imaging office will be calling you to schedule your appointment after we obtain authorization from your insurance company.   Please be sure you have signed up for MyChart so that we can get your results to you.  We will be in touch with you as soon as we can.  Please know, it can take up to 3-4 business days for the radiologist and Dr. Paulla Fore to have time to review the results and determine the  best appropriate action.  If there is something that appears to be surgical or needs a referral to other specialists we will let you know through [facility] or telephone.  Otherwise we will plan to schedule a follow up appointment with Dr. Paulla Fore once we have the results.   =================================================================   Follow-up: Return for after MRI.   CMA/ATC served as Education administrator during this visit. History, Physical, and Plan performed by medical provider. Documentation and orders reviewed and attested to.      Teresa Coombs, [facility] Physician

## 2016-08-11 ENCOUNTER — Other Ambulatory Visit: Payer: Self-pay | Admitting: Family Medicine

## 2016-08-19 ENCOUNTER — Ambulatory Visit
Admission: RE | Admit: 2016-08-19 | Discharge: 2016-08-19 | Disposition: A | Discharge status: Home / Self Care | Payer: Medicare Other | Source: Ambulatory Visit | Attending: Sports Medicine | Admitting: Sports Medicine

## 2016-08-19 DIAGNOSIS — M79672 Pain in left foot: Secondary | ICD-10-CM

## 2016-08-24 ENCOUNTER — Telehealth: Payer: Self-pay | Admitting: Sports Medicine

## 2016-08-24 NOTE — Telephone Encounter (Signed)
See lab annotation

## 2016-08-24 NOTE — Telephone Encounter (Signed)
Patient returning missed call from [facility]. Please call patient and advise. OK to leave message.

## 2016-08-27 ENCOUNTER — Ambulatory Visit (INDEPENDENT_AMBULATORY_CARE_PROVIDER_SITE_OTHER): Payer: Medicare Other | Admitting: Sports Medicine

## 2016-08-27 ENCOUNTER — Encounter: Payer: Self-pay | Admitting: Sports Medicine

## 2016-08-27 VITALS — BP 140/86 | HR 68 | Ht 67.0 in | Wt 217.4 lb

## 2016-08-27 DIAGNOSIS — M79672 Pain in left foot: Secondary | ICD-10-CM | POA: Diagnosis not present

## 2016-08-27 DIAGNOSIS — M19072 Primary osteoarthritis, left ankle and foot: Secondary | ICD-10-CM | POA: Diagnosis not present

## 2016-08-27 DIAGNOSIS — M19071 Primary osteoarthritis, right ankle and foot: Secondary | ICD-10-CM | POA: Diagnosis not present

## 2016-08-27 NOTE — Patient Instructions (Signed)
Look into having your insurance company cover a set of custom orthotics.  The code is L3030 and there are 2 units.  You can call them  and ask if this is covered.  I am happy to do these for you at any time, you just need to let our front office schedulers know you would like an "orthotic appointment."  

## 2016-08-27 NOTE — Progress Notes (Signed)
OFFICE VISIT NOTE Matthew Watson, Matthew Watson at [place]  Matthew Watson - 66 y.o. male MRN [id number]  Date of birth: [birth year]  Visit Date: 08/27/2016  PCP: Marin Olp, MD   Referred by: Marin Olp, MD  Burlene Arnt, CMA acting as scribe for Dr. Paulla Fore.  SUBJECTIVE:   Chief Complaint  Patient presents with  . Follow-up    MRI review   HPI: As below and per problem based documentation when appropriate.  Matthew Watson is an established patient presenting today in follow-up of MRI of the left foot which was done 08/19/2016. Today he would like to discuss these results and further treatment options.   He reports that he hasn't really had any change in his foot pain. He has stopped doing his hour walk so the pain is slightly less but he knows if he starts again the pain will be just as bad.     Review of Systems  Constitutional: Negative for chills and fever.  Respiratory: Negative for shortness of breath and wheezing.   Cardiovascular: Positive for leg swelling. Negative for chest pain and palpitations.  Musculoskeletal: Negative for falls.  Neurological: Positive for headaches. Negative for dizziness and tingling.  Endo/Heme/Allergies: Does not bruise/bleed easily.    Otherwise per HPI.  HISTORY & PERTINENT PRIOR DATA:  No specialty comments available. He reports that he quit smoking about 26 years ago. His smoking use included Cigarettes. He has a 2.50 pack-year smoking history. He has never used smokeless tobacco.   Recent Labs  08/08/16 1112  LABURIC 4.9   Medications & Allergies reviewed per EMR Patient Active Problem List   Diagnosis Date Noted  . Arthritis of left foot 08/27/2016  . Allergic rhinitis 02/11/2014  . Arthritis of right ankle 02/11/2014  . Family history of melanoma 02/11/2014  . GERD (gastroesophageal reflux disease) 02/11/2014  . Former smoker 02/11/2014  . INSOMNIA,  CHRONIC 07/01/2009  . BPH (benign prostatic hyperplasia) 12/17/2006  . Actinic keratosis 12/17/2006  . History of colonic polyps 08/26/2006  . Hyperlipidemia 08/19/2006  . Essential hypertension 08/19/2006   Past Medical History:  Diagnosis Date  . Adenomatous polyp of colon   . Allergy    seasonal  . Arthritis   . BPH (benign prostatic hyperplasia)   . Diverticulosis 09/2010   episode of diverticulitis  . GERD (gastroesophageal reflux disease)   . Hyperlipidemia   . Hypertension   . Low back pain    Family History  Problem Relation Age of Onset  . Cancer Father        melanoma, cousin also died of it  . Cancer Brother        melanoma  . Colon polyps Sister   . Cancer Paternal Uncle        Colon cancer  . Colon cancer Paternal Uncle    Past Surgical History:  Procedure Laterality Date  . ANKLE FRACTURE SURGERY     chronic pain as result  . COLONOSCOPY    . HERNIA REPAIR     jan 2012, [facility]   Social History   Occupational History  . Not on file.   Social History Main Topics  . Smoking status: Former Smoker    Packs/day: 0.25    Years: 10.00    Types: Cigarettes    Quit date: 03/23/1990  . Smokeless tobacco: Never Used     Comment: quit in 1992  . Alcohol use 3.6  oz/week    6 Cans of beer per week  . Drug use: No  . Sexual activity: Yes    OBJECTIVE:  VS:  HT:5\' 7"  (170.2 cm)   WT:217 lb 6.4 oz (98.6 kg)  BMI:34.1    BP:140/86  HR:68bpm  TEMP: ( )  RESP:96 % EXAM: Findings:  WDWN, NAD, Non-toxic appearing Alert & appropriately interactive Not depressed or anxious appearing No increased work of breathing. Pupils are equal. EOM intact without nystagmus No clubbing or cyanosis of the extremities appreciated No significant rashes/lesions/ulcerations overlying the examined area. DP & PT pulses 2+/4.  No significant pretibial edema. Sensation intact to light touch in lower extremities.  Right ankle has significant bossing and prominence medially  from prior ankle fusion surgery.  He has no significant pain palpation of the Achilles or calf muscles.  He has good forefoot motion and pain only with terminal forefoot dorsiflexion.  Otherwise has a high cavus arch.  Left foot has good dorsiflexion and plantarflexion without significant pain.  He has focal pain over the subtalar joint as well as fourth metatarsal tarsal junction.  He does have a high cavus foot and abnormality in his gait with an antalgic gait.      Mr Foot Left Wo Contrast  Result Date: 08/19/2016 CLINICAL DATA:  Left midfoot pain centered about the first metatarsal for 2 years. No known injury. EXAM: MRI OF THE LEFT FOOT WITHOUT CONTRAST TECHNIQUE: Multiplanar, multisequence MR imaging of the left foot was performed. No intravenous contrast was administered. COMPARISON:  Plain films left foot 08/08/2016. FINDINGS: Bones/Joint/Cartilage The patient has advanced midfoot osteoarthritis. Subchondral cyst formation and edema are worst at the articulations of the navicular and medial and middle cuneiforms and the first and fourth tarsometatarsal joints. No fracture is identified. No worrisome lesion is seen. Ligaments Intact. Muscles and Tendons Intact and normal in appearance. Soft tissues Negative. IMPRESSION: Advanced osteoarthritis of the midfoot appears worse at the articulations of the navicular and medial and middle cuneiforms. Milder degree of advanced degenerative change about the first and fourth tarsometatarsal joints is also identified. No acute abnormality. Electronically Signed   By: Inge Rise M.D.   On: 08/19/2016 11:24   Dg Foot Complete Left  Result Date: 08/08/2016 CLINICAL DATA:  Left foot pain. EXAM: LEFT FOOT - COMPLETE 3+ VIEW COMPARISON:  None. FINDINGS: There is no evidence of fracture or dislocation. There is no evidence of arthropathy. Mild spurring of posterior calcaneus is noted. Soft tissues are unremarkable. IMPRESSION: No acute abnormality seen in the  left foot. Electronically Signed   By: Marijo Conception, M.D.   On: 08/08/2016 13:41   ASSESSMENT & PLAN:     ICD-10-CM   1. Arthritis of right ankle M19.071   2. Left foot pain M79.672   3. Arthritis of left foot M19.072   ================================================================= Arthritis of left foot Marked changes of the subtalar joint as well as fourth tarsometatarsal joint.  This is causing an abnormality in his gait and he would benefit from custom cushion orthotics for both his right and his left lower extremity.  Discussed the lower extremity swelling on the right ankle is likely secondary to venous insufficiency secondary to loss of musculoskeletal pump from fusion of the ankle.  If any worsening symptoms we will plan to have him wear compression socks but has tried this in the past and does not tolerate them well.  He will plan follow-up for custom cushion orthotics at his convenience.  =================================================================  Follow-up: Return for for custom orthotics .   CMA/ATC served as Education administrator during this visit. History, Physical, and Plan performed by medical provider. Documentation and orders reviewed and attested to.      Teresa Coombs, [facility] Physician

## 2016-08-27 NOTE — Assessment & Plan Note (Signed)
Marked changes of the subtalar joint as well as fourth tarsometatarsal joint.  This is causing an abnormality in his gait and he would benefit from custom cushion orthotics for both his right and his left lower extremity.  Discussed the lower extremity swelling on the right ankle is likely secondary to venous insufficiency secondary to loss of musculoskeletal pump from fusion of the ankle.  If any worsening symptoms we will plan to have him wear compression socks but has tried this in the past and does not tolerate them well.  He will plan follow-up for custom cushion orthotics at his convenience.

## 2016-08-30 ENCOUNTER — Encounter: Payer: Self-pay | Admitting: Sports Medicine

## 2016-08-30 ENCOUNTER — Ambulatory Visit (INDEPENDENT_AMBULATORY_CARE_PROVIDER_SITE_OTHER): Payer: Medicare Other | Admitting: Sports Medicine

## 2016-08-30 VITALS — BP 144/92 | HR 68 | Ht 67.0 in | Wt 213.8 lb

## 2016-08-30 DIAGNOSIS — M79672 Pain in left foot: Secondary | ICD-10-CM

## 2016-08-30 DIAGNOSIS — M19071 Primary osteoarthritis, right ankle and foot: Secondary | ICD-10-CM | POA: Diagnosis not present

## 2016-08-30 DIAGNOSIS — M19072 Primary osteoarthritis, left ankle and foot: Secondary | ICD-10-CM | POA: Diagnosis not present

## 2016-08-30 DIAGNOSIS — R269 Unspecified abnormalities of gait and mobility: Secondary | ICD-10-CM

## 2016-08-30 NOTE — Progress Notes (Signed)
OFFICE VISIT NOTE Matthew Watson. Matthew Watson, [place] [facility] [place]  Matthew Watson - 66 y.o. male MRN [id number]  Date of birth: 16-Feb-1950  Visit Date: 08/30/2016  PCP: Marin Olp, MD   Referred by: Marin Olp, MD  Burlene Arnt, CMA acting as scribe for Dr. Paulla Fore.  SUBJECTIVE:   Chief Complaint  Patient presents with  . Follow-up    arthritis of both feet   HPI: As below and per problem based documentation when appropriate.  Matthew Watson is an established patient presenting today in follow-up of arthritis of both feet. He would like to be fitting for orthotic inserts today.     Review of Systems  Constitutional: Negative for chills and fever.  Respiratory: Negative for shortness of breath and wheezing.   Cardiovascular: Positive for leg swelling (rt leg). Negative for chest pain and palpitations.  Musculoskeletal: Negative for falls.  Neurological: Positive for tingling (rt hand). Negative for dizziness and headaches.  Endo/Heme/Allergies: Does not bruise/bleed easily.    Otherwise per HPI.  HISTORY & PERTINENT PRIOR DATA:  No specialty comments available. He reports that he quit smoking about 26 years ago. His smoking use included Cigarettes. He has a 2.50 pack-year smoking history. He has never used smokeless tobacco.   Recent Labs  08/08/16 1112  LABURIC 4.9   Medications & Allergies reviewed per EMR Patient Active Problem List   Diagnosis Date Noted  . Arthritis of left foot 08/27/2016  . Allergic rhinitis 02/11/2014  . Arthritis of right ankle 02/11/2014  . Family history of melanoma 02/11/2014  . GERD (gastroesophageal reflux disease) 02/11/2014  . Former smoker 02/11/2014  . INSOMNIA, CHRONIC 07/01/2009  . BPH (benign prostatic hyperplasia) 12/17/2006  . Actinic keratosis 12/17/2006  . History of colonic polyps 08/26/2006  . Hyperlipidemia 08/19/2006  . Essential hypertension 08/19/2006     Past Medical History:  Diagnosis Date  . Adenomatous polyp of colon   . Allergy    seasonal  . Arthritis   . BPH (benign prostatic hyperplasia)   . Diverticulosis 09/2010   episode of diverticulitis  . GERD (gastroesophageal reflux disease)   . Hyperlipidemia   . Hypertension   . Low back pain    Family History  Problem Relation Age of Onset  . Cancer Father        melanoma, cousin also died of it  . Cancer Brother        melanoma  . Colon polyps Sister   . Cancer Paternal Uncle        Colon cancer  . Colon cancer Paternal Uncle    Past Surgical History:  Procedure Laterality Date  . ANKLE FRACTURE SURGERY     chronic pain as result  . COLONOSCOPY    . HERNIA REPAIR     jan 2012, [place]   Social History   Occupational History  . Not on file.   Social History Main Topics  . Smoking status: Former Smoker    Packs/day: 0.25    Years: 10.00    Types: Cigarettes    Quit date: 03/23/1990  . Smokeless tobacco: Never Used     Comment: quit in 1992  . Alcohol use 3.6 oz/week    6 Cans of beer per week  . Drug use: No  . Sexual activity: Yes    OBJECTIVE:  VS:  HT:5\' 7"  (170.2 cm)   WT:213 lb 12.8 oz (97 kg)  BMI:33.6  BP:(!) 144/92  HR:68bpm  TEMP: ( )  RESP:97 % EXAM: No additional findings.    Mr Foot Left Wo Contrast  Result Date: 08/19/2016 CLINICAL DATA:  Left midfoot pain centered about the first metatarsal for 2 years. No known injury. EXAM: MRI OF THE LEFT FOOT WITHOUT CONTRAST TECHNIQUE: Multiplanar, multisequence MR imaging of the left foot was performed. No intravenous contrast was administered. COMPARISON:  Plain films left foot 08/08/2016. FINDINGS: Bones/Joint/Cartilage The patient has advanced midfoot osteoarthritis. Subchondral cyst formation and edema are worst at the articulations of the navicular and medial and middle cuneiforms and the first and fourth tarsometatarsal joints. No fracture is identified. No worrisome lesion is seen.  Ligaments Intact. Muscles and Tendons Intact and normal in appearance. Soft tissues Negative. IMPRESSION: Advanced osteoarthritis of the midfoot appears worse at the articulations of the navicular and medial and middle cuneiforms. Milder degree of advanced degenerative change about the first and fourth tarsometatarsal joints is also identified. No acute abnormality. Electronically Signed   By: Matthew Watson M.D.   On: 08/19/2016 11:24   Dg Foot Complete Left  Result Date: 08/08/2016 CLINICAL DATA:  Left foot pain. EXAM: LEFT FOOT - COMPLETE 3+ VIEW COMPARISON:  None. FINDINGS: There is no evidence of fracture or dislocation. There is no evidence of arthropathy. Mild spurring of posterior calcaneus is noted. Soft tissues are unremarkable. IMPRESSION: No acute abnormality seen in the left foot. Electronically Signed   By: Matthew Watson, M.D.   On: 08/08/2016 13:41   ASSESSMENT & PLAN:     ICD-10-CM   1. Arthritis of right ankle M19.071 PR FOOT ARCH SUPPORT REMOV PREM  2. Left foot pain M79.672 PR FOOT ARCH SUPPORT REMOV PREM  3. Gait disturbance R26.9 PR FOOT ARCH SUPPORT REMOV PREM  4. Arthritis of left foot M19.072 PR FOOT ARCH SUPPORT REMOV PREM  ================================================================= Arthritis of right ankle PROCEDURE: CUSTOM ORTHOTIC FABRICATION Patient's underlying musculoskeletal conditions are directly related to poor biomechanics and will benefit from a functional custom orthotic.  There are no significant foot deformities that complicate the use of a custom orthotic.  The patient was fitted for a standard, cushioned, semi-rigid orthotic. The orthotic was heated & placed on the orthotic stand. The patient was positioned in subtalar neutral position and 10 of ankle dorsiflexion and weight bearing stance some heated orthotic blank. After completion of the molding a stable paste was applied to the orthotic blank. The orthotic was ground to a stable position for  weightbearing. The patient ambulated in these and reported they were comfortable without pressure spots.              BLANK:  Size 12 - Standard Cushioned                 BASE:  Blue EVA      POSTINGS:  None  =================================================================  Follow-up: Return if symptoms worsen or fail to improve.   CMA/ATC served as Education administrator during this visit. History, Physical, and Plan performed by medical provider. Documentation and orders reviewed and attested to.      Teresa Coombs, [place] [facility] Physician

## 2016-08-30 NOTE — Assessment & Plan Note (Signed)
PROCEDURE: CUSTOM ORTHOTIC FABRICATION Patient's underlying musculoskeletal conditions are directly related to poor biomechanics and will benefit from a functional custom orthotic.  There are no significant foot deformities that complicate the use of a custom orthotic.  The patient was fitted for a standard, cushioned, semi-rigid orthotic. The orthotic was heated & placed on the orthotic stand. The patient was positioned in subtalar neutral position and 10 of ankle dorsiflexion and weight bearing stance some heated orthotic blank. After completion of the molding a stable paste was applied to the orthotic blank. The orthotic was ground to a stable position for weightbearing. The patient ambulated in these and reported they were comfortable without pressure spots.              BLANK:  Size 12 - Standard Cushioned                 BASE:  Blue EVA      POSTINGS:  None

## 2016-09-10 ENCOUNTER — Encounter: Payer: Self-pay | Admitting: Internal Medicine

## 2016-09-10 ENCOUNTER — Telehealth: Payer: Self-pay | Admitting: Family Medicine

## 2016-09-10 ENCOUNTER — Ambulatory Visit (INDEPENDENT_AMBULATORY_CARE_PROVIDER_SITE_OTHER): Payer: Medicare Other | Admitting: Family Medicine

## 2016-09-10 VITALS — BP 132/80 | HR 76 | Ht 67.0 in | Wt 211.8 lb

## 2016-09-10 DIAGNOSIS — K625 Hemorrhage of anus and rectum: Secondary | ICD-10-CM

## 2016-09-10 LAB — CBC
HEMATOCRIT: 37.2 % — AB (ref 39.0–52.0)
HEMOGLOBIN: 12.6 g/dL — AB (ref 13.0–17.0)
MCHC: 33.9 g/dL (ref 30.0–36.0)
MCV: 88.9 fl (ref 78.0–100.0)
Platelets: 257 10*3/uL (ref 150.0–400.0)
RBC: 4.18 Mil/uL — AB (ref 4.22–5.81)
RDW: 13.2 % (ref 11.5–15.5)
WBC: 11.1 10*3/uL — AB (ref 4.0–10.5)

## 2016-09-10 LAB — BASIC METABOLIC PANEL
BUN: 22 mg/dL (ref 6–23)
CALCIUM: 9.1 mg/dL (ref 8.4–10.5)
CHLORIDE: 107 meq/L (ref 96–112)
CO2: 29 meq/L (ref 19–32)
Creatinine, Ser: 0.9 mg/dL (ref 0.40–1.50)
GFR: 89.57 mL/min (ref >60.00)
GLUCOSE: 111 mg/dL — AB (ref 70–99)
Potassium: 3.8 mEq/L (ref 3.5–5.1)
SODIUM: 140 meq/L (ref 135–145)

## 2016-09-10 NOTE — Progress Notes (Signed)
Subjective:  Matthew Watson is a 66 y.o. year old very pleasant male patient who presents for/with See problem oriented charting ROS- see ROS in HPI/S section   Past Medical History-  Patient Active Problem List   Diagnosis Date Noted  . Arthritis of right ankle 02/11/2014    Priority: Medium  . BPH (benign prostatic hyperplasia) 12/17/2006    Priority: Medium  . Hyperlipidemia 08/19/2006    Priority: Medium  . Essential hypertension 08/19/2006    Priority: Medium  . Allergic rhinitis 02/11/2014    Priority: Low  . Family history of melanoma 02/11/2014    Priority: Low  . GERD (gastroesophageal reflux disease) 02/11/2014    Priority: Low  . Former smoker 02/11/2014    Priority: Low  . INSOMNIA, CHRONIC 07/01/2009    Priority: Low  . Actinic keratosis 12/17/2006    Priority: Low  . History of colonic polyps 08/26/2006    Priority: Low  . Arthritis of left foot 08/27/2016    Medications- reviewed and updated Current Outpatient Prescriptions  Medication Sig Dispense Refill  . amLODipine-benazepril (LOTREL) 5-20 MG capsule TAKE 1 CAPSULE BY MOUTH DAILY. 90 capsule 1  . aspirin 81 MG tablet Take 81 mg by mouth daily.      Marland Kitchen atorvastatin (LIPITOR) 40 MG tablet TAKE 1 TABLET (40 MG TOTAL) BY MOUTH DAILY. 90 tablet 2  . fexofenadine (ALLEGRA) 180 MG tablet Take 180 mg by mouth daily.    . finasteride (PROSCAR) 5 MG tablet TAKE 1 TABLET (5 MG TOTAL) BY MOUTH DAILY. 90 tablet 2  . fluticasone (FLONASE) 50 MCG/ACT nasal spray INSTILL 2 SPRAYS IN EACH NOSTRIL ONCE DAILY 16 g 3  . HYDROcodone-acetaminophen (NORCO/VICODIN) 5-325 MG tablet TAKE 1/2-1 TABLET BY MOUTH EVERY 8 HOURS AS NEEDED FOR MODERATE PAIN. Max 5 per week. 45 tablet 0  . meloxicam (MOBIC) 15 MG tablet TAKE 1 TABLET (15 MG TOTAL) BY MOUTH DAILY. 90 tablet 1  . omeprazole (PRILOSEC) 20 MG capsule TAKE 1 CAPSULE (20 MG TOTAL) BY MOUTH DAILY. 90 capsule 1   No current facility-administered medications for this visit.      Objective: BP 132/80 (BP Location: Left Arm, Patient Position: Sitting, Cuff Size: Normal)   Pulse 76   Ht 5\' 7"  (1.702 m)   Wt 211 lb 12.8 oz (96.1 kg)   SpO2 98%   BMI 33.17 kg/m  Gen: NAD, resting comfortably CV: RRR no murmurs rubs or gallops Lungs: CTAB no crackles, wheeze, rhonchi Abdomen: soft/nontender/nondistended/normal bowel sounds. No rebound or guarding.  Ext: trace edema Skin: warm, dry Rectal: some dried blood around rectum. Hemorrhoid tag that appears irritated but with no active bleeding  Assessment/Plan:  Bright red blood per rectum S: since Friday patient has noted blood in the toilet after bowel movements. Stools have been loser and usually they are not. 2-3 bowel movements each morning like this on Friday, Saturday, Sunday, and today. Blood can seem somewhat gel like. Typical is 1-2 BMs in the morning. Notes blood on toilet paper as well.   He also has some sharp pains in the lower abdominal area before he has the bowel movement. No other pain associated with thsi and states only mild.   Colonoscopy 05/18/16. Moderate diverticulosis. Took aspirin today. mobic yesterday ROS- no lightheadedness/dizziness, chest pain, shortness of breath, fatigue, nausea, vomiting, melena.   A/P: well appearing. Recent colonoscopy 05/2016 with diverticula only- likely diverticular bleed. Could have slight hemorrhoid bleed as well with hemorrhoid noted. Plan below. Doubt  brisk upper GI bleed and with no melena doubt this is upper GI in origin.  Patient Instructions  Strongly suspect this is one of your diverticula that is bleeding slightly  One small hemorrhoid tag noted- this also could have contributed to symptoms  Stop aspirin  Stop mobic  Stop at schedulers- sign up for 10:15 visit on Friday. If continues at that point may have to get you back in with the stomach docs/GI  If you have any symptoms we talked about- lightheadedness/dizziness, chest pain, shortness of breath,  strong fatigue, increasing volume or frequency of blood see [place] back sooner  Orders Placed This Encounter  Procedures  . CBC    [place]  . Basic metabolic panel    [place]   Return precautions advised.  Garret Reddish, MD

## 2016-09-10 NOTE — Telephone Encounter (Signed)
[facility] - Client [place] [facility]  Patient Name: Matthew Watson  DOB: 18-Dec-1950    Initial Comment Caller states he's having a lot of blood in the toilet when he has BM. This has been going on since Friday. Sharp pain in lower abdominal.   Nurse Assessment  Nurse: Harlow Mares, RN, Suanne Marker Date/Time (Eastern Time): 09/10/2016 8:15:08 AM  Confirm and document reason for call. If symptomatic, describe symptoms. ---Caller states he's having a lot of blood in the toilet when he has BM. This has been going on since Friday. Sharp pain in lower abdominal.  Does the patient have any new or worsening symptoms? ---Yes  Will a triage be completed? ---Yes  Related visit to physician within the last 2 weeks? ---Yes  Does the PT have any chronic conditions? (i.e. diabetes, asthma, etc.) ---Yes  List chronic conditions. ---HTN; high chol; GERD  Is this a behavioral health or substance abuse call? ---No     Guidelines    Guideline Title Affirmed Question Affirmed Notes  Abdominal Pain - Male Blood in bowel movements (Exception: Blood on surface of BM with constipation)    Final Disposition User   Go to ED Now Harlow Mares, RN, [place]    Comments  Caller refused to go to the ED, but agreed to be seen in MD office. Appt at 9:30am at [facility] made with Dr. Suszanne Finch today.   Referrals  REFERRED TO PCP OFFICE   Disagree/Comply: Disagree  Disagree/Comply Reason: Wait and see

## 2016-09-10 NOTE — Telephone Encounter (Signed)
Noted  

## 2016-09-10 NOTE — Patient Instructions (Addendum)
Strongly suspect this is one of your diverticula that is bleeding slightly  One small hemorrhoid tag noted- this also could have contributed to symptoms  Stop aspirin  Stop mobic  Stop at schedulers- sign up for 10:15 visit on Friday. If continues at that point may have to get you back in with the stomach docs/GI  If you have any symptoms we talked about- lightheadedness/dizziness, chest pain, shortness of breath, strong fatigue, increasing volume or frequency of blood see [place] back sooner

## 2016-09-11 ENCOUNTER — Other Ambulatory Visit (INDEPENDENT_AMBULATORY_CARE_PROVIDER_SITE_OTHER): Payer: Medicare Other

## 2016-09-11 ENCOUNTER — Telehealth: Payer: Self-pay | Admitting: Gastroenterology

## 2016-09-11 ENCOUNTER — Telehealth: Payer: Self-pay

## 2016-09-11 ENCOUNTER — Other Ambulatory Visit: Payer: Self-pay

## 2016-09-11 DIAGNOSIS — D649 Anemia, unspecified: Secondary | ICD-10-CM

## 2016-09-11 LAB — CBC
HEMATOCRIT: 33.4 % — AB (ref 39.0–52.0)
Hemoglobin: 11.5 g/dL — ABNORMAL LOW (ref 13.0–17.0)
MCHC: 34.3 g/dL (ref 30.0–36.0)
MCV: 88.8 fl (ref 78.0–100.0)
PLATELETS: 254 10*3/uL (ref 150.0–400.0)
RBC: 3.76 Mil/uL — ABNORMAL LOW (ref 4.22–5.81)
RDW: 13.1 % (ref 11.5–15.5)
WBC: 9.2 10*3/uL (ref 4.0–10.5)

## 2016-09-11 NOTE — Telephone Encounter (Signed)
Linna Hoff, Got a call from Dr. Yong Channel about this patient. He's had some rectal bleeding every morning since Friday - only in the morning and not the rest of the day interestingly. Some reported anorectal discomfort with the bleeding. He's dropped his Hgb a few points from baseline. Not sure if he is having hemorrhoidal bleeding or fissure given his rectal discomfort, versus a slow diverticular bleed. Dr. Yong Channel is going to check another CBC tomorrow. Not sure if you or one of the PAs have any openings in the next day or so to do a DRE / anoscopy to assess for anorectal source. I advised Dr. Yong Channel if he has worsening / significant bleeding, worsening anemia or symptoms from anemia, etc, he should go to the ED in the interim. He will go to clear liquid diet as well for now.

## 2016-09-11 NOTE — Telephone Encounter (Signed)
Pt called wanting to confirm when he needs the repeat of Haemoglobin levels, scheduled pt for this afternoon for labs per doctor [facility]

## 2016-09-12 ENCOUNTER — Other Ambulatory Visit: Payer: Self-pay

## 2016-09-12 ENCOUNTER — Other Ambulatory Visit (INDEPENDENT_AMBULATORY_CARE_PROVIDER_SITE_OTHER): Payer: Medicare Other

## 2016-09-12 DIAGNOSIS — K625 Hemorrhage of anus and rectum: Secondary | ICD-10-CM

## 2016-09-12 DIAGNOSIS — D649 Anemia, unspecified: Secondary | ICD-10-CM

## 2016-09-12 LAB — CBC
HCT: 34.7 % — ABNORMAL LOW (ref 39.0–52.0)
Hemoglobin: 11.9 g/dL — ABNORMAL LOW (ref 13.0–17.0)
MCHC: 34.3 g/dL (ref 30.0–36.0)
MCV: 88.2 fl (ref 78.0–100.0)
PLATELETS: 268 10*3/uL (ref 150.0–400.0)
RBC: 3.94 Mil/uL — AB (ref 4.22–5.81)
RDW: 13.2 % (ref 11.5–15.5)
WBC: 8.4 10*3/uL (ref 4.0–10.5)

## 2016-09-12 NOTE — Telephone Encounter (Signed)
3:45 pm 09/14/16 appt with Dr Ardis Hughs, pt aware

## 2016-09-12 NOTE — Telephone Encounter (Signed)
Left message on machine to call back  

## 2016-09-12 NOTE — Telephone Encounter (Signed)
Matthew Watson, thanks  Matthew Watson, Looks like I have an opening for office appt this Friday.  Can you put him in for that spot?  Thanks

## 2016-09-13 ENCOUNTER — Telehealth: Payer: Self-pay | Admitting: Family Medicine

## 2016-09-13 NOTE — Telephone Encounter (Signed)
Pt is returning Matthew Watson call °

## 2016-09-14 ENCOUNTER — Ambulatory Visit: Payer: Medicare Other | Admitting: Family Medicine

## 2016-09-14 ENCOUNTER — Encounter: Payer: Self-pay | Admitting: Gastroenterology

## 2016-09-14 ENCOUNTER — Ambulatory Visit (INDEPENDENT_AMBULATORY_CARE_PROVIDER_SITE_OTHER): Payer: Medicare Other | Admitting: Gastroenterology

## 2016-09-14 VITALS — BP 102/70 | HR 72 | Ht 67.0 in | Wt 209.0 lb

## 2016-09-14 DIAGNOSIS — K625 Hemorrhage of anus and rectum: Secondary | ICD-10-CM

## 2016-09-14 DIAGNOSIS — K649 Unspecified hemorrhoids: Secondary | ICD-10-CM | POA: Diagnosis not present

## 2016-09-14 NOTE — Telephone Encounter (Signed)
Spoke with patient earlier

## 2016-09-14 NOTE — Progress Notes (Signed)
Review of pertinent gastrointestinal problems: 1. Personal history of colonic polyps: colonoscopy 2007 1 subCM adenoma; colonoscopy 2012 two polyps, one was a subCM adenoma. Colonoscopy 05/2016 No polyps, recommended recall at 10 years. 2. Incidental diverticulosis noted on colonoscopies.   HPI: This is a very pleasant 66 year old man whom I last saw the time of a colonoscopy 3 months ago.  Chief complaint is rectal bleeding  Starting about a week ago he had for 5 days of blood dripping from his backside at the time of bowel movements. This was bright red blood. He had no associated anal pain. No anal trauma. He's never had bleeding like this before. Past 2 or 3 days he is only seen a bit of blood mixed with stool.  Otherwise he feels fine.  Hb 12.6 to 11.9 in the past week.  ROS: complete GI ROS as described in HPI, all other review negative.  Constitutional:  No unintentional weight loss   Past Medical History:  Diagnosis Date  . Adenomatous polyp of colon   . Allergy    seasonal  . Arthritis   . BPH (benign prostatic hyperplasia)   . Diverticulosis 09/2010   episode of diverticulitis  . GERD (gastroesophageal reflux disease)   . Hyperlipidemia   . Hypertension   . Low back pain     Past Surgical History:  Procedure Laterality Date  . ANKLE FRACTURE SURGERY     chronic pain as result  . COLONOSCOPY    . HERNIA REPAIR     jan 2012, [facility]    Current Outpatient Prescriptions  Medication Sig Dispense Refill  . amLODipine-benazepril (LOTREL) 5-20 MG capsule TAKE 1 CAPSULE BY MOUTH DAILY. 90 capsule 1  . atorvastatin (LIPITOR) 40 MG tablet TAKE 1 TABLET (40 MG TOTAL) BY MOUTH DAILY. 90 tablet 2  . fexofenadine (ALLEGRA) 180 MG tablet Take 180 mg by mouth daily.    . finasteride (PROSCAR) 5 MG tablet TAKE 1 TABLET (5 MG TOTAL) BY MOUTH DAILY. 90 tablet 2  . fluticasone (FLONASE) 50 MCG/ACT nasal spray INSTILL 2 SPRAYS IN EACH NOSTRIL ONCE DAILY 16 g 3  .  HYDROcodone-acetaminophen (NORCO/VICODIN) 5-325 MG tablet TAKE 1/2-1 TABLET BY MOUTH EVERY 8 HOURS AS NEEDED FOR MODERATE PAIN. Max 5 per week. 45 tablet 0  . omeprazole (PRILOSEC) 20 MG capsule TAKE 1 CAPSULE (20 MG TOTAL) BY MOUTH DAILY. 90 capsule 1  . aspirin 81 MG tablet Take 81 mg by mouth daily.      . meloxicam (MOBIC) 15 MG tablet TAKE 1 TABLET (15 MG TOTAL) BY MOUTH DAILY. (Patient not taking: Reported on 09/14/2016) 90 tablet 1   No current facility-administered medications for this visit.     Allergies as of 09/14/2016  . (No Known Allergies)    Family History  Problem Relation Age of Onset  . Cancer Father        melanoma, cousin also died of it  . Cancer Brother        melanoma  . Colon polyps Sister   . Cancer Paternal Uncle        Colon cancer  . Colon cancer Paternal Uncle     Social History   Social History  . Marital status: Married    Spouse name: N/A  . Number of children: N/A  . Years of education: N/A   Occupational History  . Not on file.   Social History Main Topics  . Smoking status: Former Smoker    Packs/day: 0.25  Years: 10.00    Types: Cigarettes    Quit date: 03/23/1990  . Smokeless tobacco: Never Used     Comment: quit in 1992  . Alcohol use 3.6 oz/week    6 Cans of beer per week  . Drug use: No  . Sexual activity: Yes   Other Topics Concern  . Not on file   Social History Narrative   Married >30 years in 2016. No children. 2 dogs.       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 There is a small skin tag associated as well.  Assessment and plan: 66 y.o. male with internal hemorrhoid, bleeding  He is going to try to bulk his stool with fiber supplements and he will call to report on his response in for 5 weeks. He has been taking meloxicam and aspirin. The aspirin is something he just decided to 20 years ago and he is continue doing it. He has no hard indications for aspirin therapy and so I told him to stop taking it. He can resume his meloxicam and 3 days. If he continues to be bothered by intermittent rectal bleeding despite good fiber supplement bulking of the stools and evening and out of his bowel habits and I would likely refer him to one of partners to consider in office internal hemorrhoid banding.  Please see the "Patient Instructions" section for addition details about the plan.  Owens Loffler, MD [facility] 09/14/2016, 3:49 PM

## 2016-09-14 NOTE — Patient Instructions (Addendum)
Please start taking citrucel (orange flavored) powder fiber supplement.  This may cause some bloating at first but that usually goes away. Begin with a small spoonful and work your way up to a large, heaping spoonful daily over a week.  Don't resume aspirin.  OK to resume mobic in 3-4 more days.  Call in 5-6 weeks to report on your response.  Normal BMI (Body Mass Index- based on height and weight) is between 23 and 30. Your BMI today is Body mass index is 32.73 kg/m. Marland Kitchen Please consider follow up  regarding your BMI with your Primary Care Provider.

## 2016-09-15 ENCOUNTER — Other Ambulatory Visit: Payer: Self-pay | Admitting: Family Medicine

## 2016-10-08 ENCOUNTER — Encounter: Payer: Medicare Other | Admitting: Family Medicine

## 2016-10-10 ENCOUNTER — Ambulatory Visit (INDEPENDENT_AMBULATORY_CARE_PROVIDER_SITE_OTHER): Payer: Medicare Other | Admitting: Family Medicine

## 2016-10-10 ENCOUNTER — Encounter: Payer: Self-pay | Admitting: Family Medicine

## 2016-10-10 VITALS — BP 118/76 | HR 63 | Temp 98.3°F | Ht 67.5 in | Wt 216.2 lb

## 2016-10-10 DIAGNOSIS — N401 Enlarged prostate with lower urinary tract symptoms: Secondary | ICD-10-CM

## 2016-10-10 DIAGNOSIS — E785 Hyperlipidemia, unspecified: Secondary | ICD-10-CM | POA: Diagnosis not present

## 2016-10-10 DIAGNOSIS — Z87891 Personal history of nicotine dependence: Secondary | ICD-10-CM | POA: Diagnosis not present

## 2016-10-10 DIAGNOSIS — M19071 Primary osteoarthritis, right ankle and foot: Secondary | ICD-10-CM | POA: Diagnosis not present

## 2016-10-10 DIAGNOSIS — Z0000 Encounter for general adult medical examination without abnormal findings: Secondary | ICD-10-CM

## 2016-10-10 DIAGNOSIS — I10 Essential (primary) hypertension: Secondary | ICD-10-CM | POA: Diagnosis not present

## 2016-10-10 DIAGNOSIS — F1190 Opioid use, unspecified, uncomplicated: Secondary | ICD-10-CM

## 2016-10-10 DIAGNOSIS — R351 Nocturia: Secondary | ICD-10-CM

## 2016-10-10 LAB — URINALYSIS
BILIRUBIN URINE: NEGATIVE
Hgb urine dipstick: NEGATIVE
KETONES UR: NEGATIVE
LEUKOCYTES UA: NEGATIVE
NITRITE: NEGATIVE
PH: 6.5 (ref 5.0–8.0)
Specific Gravity, Urine: 1.01 (ref 1.000–1.030)
TOTAL PROTEIN, URINE-UPE24: NEGATIVE
UROBILINOGEN UA: 0.2 (ref 0.0–1.0)
Urine Glucose: NEGATIVE

## 2016-10-10 LAB — CBC
HCT: 38.4 % — ABNORMAL LOW (ref 39.0–52.0)
HEMOGLOBIN: 12.6 g/dL — AB (ref 13.0–17.0)
MCHC: 32.8 g/dL (ref 30.0–36.0)
MCV: 87 fl (ref 78.0–100.0)
PLATELETS: 268 10*3/uL (ref 150.0–400.0)
RBC: 4.41 Mil/uL (ref 4.22–5.81)
RDW: 13.2 % (ref 11.5–15.5)
WBC: 9.8 10*3/uL (ref 4.0–10.5)

## 2016-10-10 LAB — COMPREHENSIVE METABOLIC PANEL
ALBUMIN: 4.3 g/dL (ref 3.5–5.2)
ALT: 15 U/L (ref 0–53)
AST: 15 U/L (ref 0–37)
Alkaline Phosphatase: 91 U/L (ref 39–117)
BILIRUBIN TOTAL: 0.7 mg/dL (ref 0.2–1.2)
BUN: 14 mg/dL (ref 6–23)
CO2: 28 mEq/L (ref 19–32)
CREATININE: 0.81 mg/dL (ref 0.40–1.50)
Calcium: 9.5 mg/dL (ref 8.4–10.5)
Chloride: 104 mEq/L (ref 96–112)
GFR: 101.12 mL/min (ref >60.00)
Glucose, Bld: 88 mg/dL (ref 70–99)
Potassium: 3.9 mEq/L (ref 3.5–5.1)
Sodium: 140 mEq/L (ref 135–145)
Total Protein: 6.5 g/dL (ref 6.0–8.3)

## 2016-10-10 LAB — LIPID PANEL
CHOLESTEROL: 145 mg/dL (ref 0–200)
HDL: 48.9 mg/dL (ref >39.00)
LDL Cholesterol: 72 mg/dL (ref 0–99)
NonHDL: 96.05
TRIGLYCERIDES: 118 mg/dL (ref 0.0–149.0)
Total CHOL/HDL Ratio: 3
VLDL: 23.6 mg/dL (ref 0.0–40.0)

## 2016-10-10 LAB — PSA: PSA: 0.2 ng/mL (ref 0.10–4.00)

## 2016-10-10 MED ORDER — HYDROCODONE-ACETAMINOPHEN 5-325 MG PO TABS: ORAL_TABLET | ORAL | 0 refills | Status: DC

## 2016-10-10 NOTE — Addendum Note (Signed)
Addended by: Netta Neat D on: 10/10/2016 11:46 AM   Modules accepted: Orders

## 2016-10-10 NOTE — Assessment & Plan Note (Signed)
Former smoker- discussed AAA screen - heading out soon and coming back in December so may be later in the year

## 2016-10-10 NOTE — Assessment & Plan Note (Signed)
Long term sparing hydrocodone for left foot and right ankle arthitis. #45 can last him up to a year. Controlled substance contract today. UDS today. [place] reviewed.

## 2016-10-10 NOTE — Progress Notes (Signed)
Phone: [phone]  Subjective:  Patient presents today for their annual physical. Chief complaint-noted.   See problem oriented charting- ROS- full  review of systems was completed and negative including No chest pain or shortness of breath. No headache or blurry vision.   The following were reviewed and entered/updated in epic: Past Medical History:  Diagnosis Date  . Adenomatous polyp of colon   . Allergy    seasonal  . Arthritis   . BPH (benign prostatic hyperplasia)   . Diverticulosis 09/2010   episode of diverticulitis  . GERD (gastroesophageal reflux disease)   . Hyperlipidemia   . Hypertension   . Low back pain    Patient Active Problem List   Diagnosis Date Noted  . Arthritis of right ankle 02/11/2014    Priority: Medium  . BPH (benign prostatic hyperplasia) 12/17/2006    Priority: Medium  . Hyperlipidemia 08/19/2006    Priority: Medium  . Essential hypertension 08/19/2006    Priority: Medium  . Allergic rhinitis 02/11/2014    Priority: Low  . Family history of melanoma 02/11/2014    Priority: Low  . GERD (gastroesophageal reflux disease) 02/11/2014    Priority: Low  . Former smoker 02/11/2014    Priority: Low  . INSOMNIA, CHRONIC 07/01/2009    Priority: Low  . Actinic keratosis 12/17/2006    Priority: Low  . History of colonic polyps 08/26/2006    Priority: Low  . Chronic narcotic use 10/10/2016  . Arthritis of left foot 08/27/2016   Past Surgical History:  Procedure Laterality Date  . ANKLE FRACTURE SURGERY     chronic pain as result  . COLONOSCOPY    . HERNIA REPAIR     jan 2012, [facility]    Family History  Problem Relation Age of Onset  . Cancer Father        melanoma, cousin also died of it  . Cancer Brother        melanoma  . Colon polyps Sister   . Cancer Paternal Uncle        Colon cancer  . Colon cancer Paternal Uncle     Medications- reviewed and updated Current Outpatient Prescriptions  Medication Sig Dispense Refill  .  amLODipine-benazepril (LOTREL) 5-20 MG capsule TAKE 1 CAPSULE BY MOUTH DAILY. 90 capsule 1  . atorvastatin (LIPITOR) 40 MG tablet TAKE 1 TABLET (40 MG TOTAL) BY MOUTH DAILY. 90 tablet 2  . fexofenadine (ALLEGRA) 180 MG tablet Take 180 mg by mouth daily.    . finasteride (PROSCAR) 5 MG tablet TAKE 1 TABLET (5 MG TOTAL) BY MOUTH DAILY. 90 tablet 2  . fluticasone (FLONASE) 50 MCG/ACT nasal spray SPRAY TWO SPRAYS IN EACH NOSTRIL ONCE DAILY 16 g 2  . HYDROcodone-acetaminophen (NORCO/VICODIN) 5-325 MG tablet TAKE 1/2-1 TABLET BY MOUTH EVERY 8 HOURS AS NEEDED FOR MODERATE PAIN. Max 5 per week. 45 tablet 0  . meloxicam (MOBIC) 15 MG tablet TAKE 1 TABLET (15 MG TOTAL) BY MOUTH DAILY. 90 tablet 1  . omeprazole (PRILOSEC) 20 MG capsule TAKE 1 CAPSULE (20 MG TOTAL) BY MOUTH DAILY. 90 capsule 1   No current facility-administered medications for this visit.     Allergies-reviewed and updated No Known Allergies  Social History   Social History  . Marital status: Married    Spouse name: N/A  . Number of children: N/A  . Years of education: N/A   Social History Main Topics  . Smoking status: Former Smoker    Packs/day: 0.25  Years: 10.00    Types: Cigarettes    Quit date: 03/23/1990  . Smokeless tobacco: Never Used     Comment: quit in 1992  . Alcohol use 3.6 oz/week    6 Cans of beer per week  . Drug use: No  . Sexual activity: Yes   Other Topics Concern  . None   Social History Narrative   Married >30 years in 2016. No children. 2 dogs.       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 Oropharynx normal Neck: no thyromegaly CV: RRR no murmurs rubs or  gallops Lungs: CTAB no crackles, wheeze, rhonchi Abdomen: soft/nontender/nondistended/normal bowel sounds. No rebound or guarding.  Ext: no edema Skin: warm, dry Neuro: grossly normal, moves all extremities, PERRLA Rectal: normal tone, normal sized prostate on finasteride, no masses or tenderness  Assessment/Plan:  66 y.o. male presenting for annual physical.  Health Maintenance counseling: 1. Anticipatory guidance: Patient counseled regarding regular dental exams - q6 months, eye exams - Dr. Bing Plume yearly, wearing seatbelts.  2. Risk factor reduction:  Advised patient of need for regular exercise and diet rich and fruits and vegetables to reduce risk of heart attack and stroke. Exercise- not doing any, discussed importance. Diet-weight creeping up- discussed importance of weight loss- states he hasnt varied much at home.discussed 5-10 lbs loss in next 6 months  Wt Readings from Last 3 Encounters:  10/10/16 216 lb 3.2 oz (98.1 kg)  09/14/16 209 lb (94.8 kg)  09/10/16 211 lb 12.8 oz (96.1 kg)  3. Immunizations/screenings/ancillary studies- declines flu shot until 2022/12/28. Discussed Tdap at pharmacy. Discussed shingrix at pharmacy Immunization History  Administered Date(s) Administered  . Influenza Split 11/21/2011  . Influenza Whole 11/18/2008, 01/11/2010  . Influenza, High Dose Seasonal PF 12/20/2015  . Influenza,inj,Quad PF,6+ Mos 11/11/2012, 11/18/2013, 12/07/2014  . Pneumococcal Conjugate-13 02/15/2015  . Pneumococcal Polysaccharide-23 02/16/2016  . Td 01/15/2005  . Zoster 07/14/2012  4. Prostate cancer screening-  low risk psa trend. Note on finasteride for BPH with nocturia. Low risk rectal exam- no nodules or asymmetry  Lab Results  Component Value Date   PSA 0.17 08/09/2015   PSA 0.24 07/07/2012   PSA 0.22 04/09/2011   5. Colon cancer screening - 05/28/16 with 10 year f/u per GI despite uncles with colon cancer due to no 1st degree relatives 6. Skin cancer screening- sees  dermatology yearly- Dr. Allyson Sabal.  advised regular sunscreen use. Denies worrisome, changing, or new skin lesions.   Status of chronic or acute concerns   Arthritis of right ankle/chronic narcotic use. On mobic daily. Also uses hydrocodone 5-325mg  about 1-2 a week. In custom orthotics.   GERD- on prilosec  Hyperlipidemia HLD- compliant with lipitor 40mg  and aspirin. Discussed could trial off statin to see what #s look like if weight down to 195 on our scales.   Essential hypertension HTN- controlled on amlodipine-benazepril 5-20mg   BPH (benign prostatic hyperplasia) BPH- on finasteride 5mg - nocturia once a night- worse if drinks alcohol. Has been having some beer and up to 3-4 x a night.   Former  smoker Former smoker- discussed AAA screen - heading out soon and coming back in December so may be later in the year  Chronic narcotic use Long term sparing hydrocodone for left foot and right ankle arthitis. #45 can last him up to a year. Controlled substance contract today. UDS today. [place] reviewed.   Return in about 6 months (around 04/09/2017) for follow up- or sooner if needed.  Orders Placed This Encounter  Procedures  . CBC    [place]  . Comprehensive metabolic panel    [place]    Order Specific Question:   Has the patient fasted?    Answer:   No  . Lipid panel    [place]    Order Specific Question:   Has the patient fasted?    Answer:   No  . PSA  . Urinalysis    Standing Status:   Future    Standing Expiration Date:   10/10/2017  . Pain Mgmt, Profile 8 w/Conf, U    Meds ordered this encounter  Medications  . HYDROcodone-acetaminophen (NORCO/VICODIN) 5-325 MG tablet    Sig: TAKE 1/2-1 TABLET BY MOUTH EVERY 8 HOURS AS NEEDED FOR MODERATE PAIN. Max 5 per week.    Dispense:  45 tablet    Refill:  0   Return precautions advised.  Garret Reddish, MD

## 2016-10-10 NOTE — Assessment & Plan Note (Signed)
BPH- on finasteride 5mg - nocturia once a night- worse if drinks alcohol. Has been having some beer and up to 3-4 x a night.

## 2016-10-10 NOTE — Assessment & Plan Note (Signed)
HLD- compliant with lipitor 40mg  and aspirin. Discussed could trial off statin to see what #s look like if weight down to 195 on our scales.

## 2016-10-10 NOTE — Patient Instructions (Addendum)
Thanks for getting flu shot. Let us know if you get shingrix or tetanus shot at your pharmacy.  Roselyn Reef will help you fill out controlled substance contract  We will call you within a week or two about your referral for ultrasound of aorta to make sure no enlargement. If you do not hear within 3 weeks, give [place] a call.   No changes in medicines today  I like your goal of getting down to 195 on our scales.   Please stop by lab before you go   ______________________________________________________________________  Starting October 1st 2018, I will be transferring to our new location:  I would love to have you remain my patient at this new location as long as it remains convenient for you. I am excited about the opportunity to have x-ray and sports medicine in the new building but will really miss the awesome staff and physicians at [facility]. Continue to schedule appointments at [facility] and we will automatically transfer them to the [place] location starting October 1st.

## 2016-10-10 NOTE — Assessment & Plan Note (Signed)
HTN- controlled on amlodipine-benazepril 5-20mg 

## 2016-10-12 ENCOUNTER — Other Ambulatory Visit: Payer: Self-pay

## 2016-10-12 DIAGNOSIS — D649 Anemia, unspecified: Secondary | ICD-10-CM

## 2016-10-25 ENCOUNTER — Encounter (HOSPITAL_COMMUNITY): Payer: Medicare Other

## 2016-11-19 ENCOUNTER — Ambulatory Visit (INDEPENDENT_AMBULATORY_CARE_PROVIDER_SITE_OTHER): Payer: Medicare Other

## 2016-11-19 DIAGNOSIS — Z23 Encounter for immunization: Secondary | ICD-10-CM | POA: Diagnosis not present

## 2016-11-20 ENCOUNTER — Encounter: Payer: Self-pay | Admitting: Family Medicine

## 2016-12-26 ENCOUNTER — Other Ambulatory Visit: Payer: Self-pay | Admitting: Family Medicine

## 2017-01-03 ENCOUNTER — Ambulatory Visit (INDEPENDENT_AMBULATORY_CARE_PROVIDER_SITE_OTHER): Payer: Medicare Other | Admitting: *Deleted

## 2017-01-03 ENCOUNTER — Encounter: Payer: Self-pay | Admitting: *Deleted

## 2017-01-03 VITALS — BP 132/80 | HR 71 | Ht 67.0 in | Wt 211.5 lb

## 2017-01-03 DIAGNOSIS — Z0000 Encounter for general adult medical examination without abnormal findings: Secondary | ICD-10-CM

## 2017-01-03 NOTE — Progress Notes (Signed)
I have reviewed and agree with note, evaluation, plan. Happy to see patient to follow up on achilles/ankle pain as needed.   Garret Reddish, MD

## 2017-01-03 NOTE — Progress Notes (Signed)
Subjective:   Matthew Watson is a 66 y.o. male who presents for an Initial Medicare Annual Wellness Visit.  Cardiac Risk Factors include: advanced age (>61men, >76 women);hypertension;male gender;obesity (BMI >30kg/m2)Last seen 9/26    Married x 43 years Children; no  Dogs  Health fair What would make it better; feet hurt and does not sleep at hs Started x 5 years  Wakes because he can't breath through his nose Sleep for 4 hours good   Diet  Breakfast ham and eggs Lunch; snack Does not do fast food Dinner salad and a protein Sweets; chocolate   Exercise Gets up every am and does house work; Gets up in the afternoon and does housework Going to [employer] ever couple of am's. Used to walk  Does not sit down   BMI 33  There are no preventive care reminders to display for this patient. Colonoscopy 07-05-2016 - one uncle died of colon cancer Started out as melanoma  Dad 86 and brother was 32 or 1   IMM Zoster 06/2012 Has skin checks once a year; goes to dermatologist     Objective:    Today's Vitals   01/03/17 0809  BP: 132/80  Pulse: 71  SpO2: 97%  Weight: 211 lb 8 oz (95.9 kg)  Height: 5\' 7"  (1.702 m)   Body mass index is 33.13 kg/m.  Advanced Directives 01/03/2017 05/18/2016  Does Patient Have a Medical Advance Directive? Yes Yes   Will bring a copy for the chart  Current Medications (verified) Outpatient Encounter Medications as of 01/03/2017  Medication Sig  . amLODipine-benazepril (LOTREL) 5-20 MG capsule TAKE 1 CAPSULE BY MOUTH DAILY.  Marland Kitchen atorvastatin (LIPITOR) 40 MG tablet TAKE 1 TABLET (40 MG TOTAL) BY MOUTH DAILY.  . fexofenadine (ALLEGRA) 180 MG tablet Take 180 mg by mouth daily.  . finasteride (PROSCAR) 5 MG tablet TAKE 1 TABLET (5 MG TOTAL) BY MOUTH DAILY.  . fluticasone (FLONASE) 50 MCG/ACT nasal spray SPRAY TWO SPRAYS IN EACH NOSTRIL ONCE DAILY  . HYDROcodone-acetaminophen (NORCO/VICODIN) 5-325 MG tablet TAKE 1/2-1 TABLET BY MOUTH EVERY 8  HOURS AS NEEDED FOR MODERATE PAIN. Max 5 per week.  . meloxicam (MOBIC) 15 MG tablet TAKE 1 TABLET (15 MG TOTAL) BY MOUTH DAILY.  Marland Kitchen omeprazole (PRILOSEC) 20 MG capsule TAKE 1 CAPSULE (20 MG TOTAL) BY MOUTH DAILY.   No facility-administered encounter medications on file as of 01/03/2017.     Allergies (verified) Patient has no known allergies.   History: Past Medical History:  Diagnosis Date  . Adenomatous polyp of colon   . Allergy    seasonal  . Arthritis   . BPH (benign prostatic hyperplasia)   . Diverticulosis 09/2010   episode of diverticulitis  . GERD (gastroesophageal reflux disease)   . Hyperlipidemia   . Hypertension   . Low back pain    Past Surgical History:  Procedure Laterality Date  . ANKLE FRACTURE SURGERY     chronic pain as result  . COLONOSCOPY    . HERNIA REPAIR     jan 2012, [place]   Family History  Problem Relation Age of Onset  . Cancer Father        melanoma, cousin also died of it  . Cancer Brother        melanoma  . Colon polyps Sister   . Cancer Paternal Uncle        Colon cancer  . Colon cancer Paternal Uncle    Social History   Socioeconomic History  .  Marital status: Married    Spouse name: Not on file  . Number of children: Not on file  . Years of education: Not on file  . Highest education level: Not on file  Social Needs  . Financial resource strain: Not on file  . Food insecurity - worry: Not on file  . Food insecurity - inability: Not on file  . Transportation needs - medical: Not on file  . Transportation needs - non-medical: Not on file  Occupational History  . Not on file  Tobacco Use  . Smoking status: Former Smoker    Packs/day: 0.25    Years: 10.00    Pack years: 2.50    Types: Cigarettes    Last attempt to quit: 03/23/1990    Years since quitting: 26.8  . Smokeless tobacco: Never Used  . Tobacco comment: quit in 1992  Substance and Sexual Activity  . Alcohol use: Yes    Alcohol/week: 3.6 oz    Types: 6 Cans  of beer per week    Comment: 2 beers a day  . Drug use: No  . Sexual activity: Yes  Other Topics Concern  . Not on file  Social History Narrative   Married >30 years in 2016. No children. 2 dogs.       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 N N  Vision? N -  Difficulty concentrating or making decisions? N -  Walking or climbing stairs? N -  Dressing or bathing? N -  Doing errands, shopping? N -  Preparing Food and eating ? N -  Using the Toilet? N -  In the past six months, have you accidently leaked urine? N -  Do you have problems with loss of bowel control? N -  Managing your Medications? N -  Managing your Finances? N -  Housekeeping or managing your Housekeeping? N -  Some recent data might be hidden     Immunizations and Health Maintenance Immunization History  Administered Date(s) Administered  . Influenza Split 11/21/2011  . Influenza Whole 11/18/2008, 01/11/2010  . Influenza, High Dose Seasonal PF 12/20/2015, 11/19/2016  . Influenza,inj,Quad PF,6+ Mos 11/11/2012, 11/18/2013, 12/07/2014  . Pneumococcal Conjugate-13 02/15/2015  . Pneumococcal Polysaccharide-23 02/16/2016  . Td 01/15/2005  . Tdap 10/10/2016  . Zoster 07/14/2012   There are no preventive care reminders to display for this patient.  Patient Care Team: Marin Olp, MD as PCP - General (Family Medicine)  Indicate any recent Medical Services you may have received from other than [facility] providers in the past year (date may be approximate).    Assessment:   This is a routine wellness examination for Matthew Watson.  Hearing/Vision screen  Hearing Screening   125Hz   250Hz  500Hz  1000Hz  2000Hz  3000Hz  4000Hz  6000Hz  8000Hz   Right ear:       100    Left ear:     100      Comments: Worked around loud trucks or equipment   Vision Screening Comments: Vision check Oct 2018 Dr. Bing Plume and Dr. Jerline Pain   Dietary issues and exercise activities discussed: Current Exercise Habits: Home exercise routine, Type of exercise: stretching, Intensity:  Mild, Exercise limited by: orthopedic condition(s)  Goals    . Exercise 150 min/wk Moderate Activity     Stretching in the am  Riding the bike in the gym; wife will retire in 30 days and you both can go to the gym      Depression Screen PHQ 2/9 Scores 01/03/2017 02/23/2016 02/15/2015  PHQ - 2 Score 0 0 0    Fall Risk Fall Risk  01/03/2017 02/23/2016 02/15/2015  Falls in the past year? Yes Yes No  Number falls in past yr: 1 1 -  [place] - -  Injury with Fall? No Yes -  Follow up Education provided - -   Fall on pier at [employer] over rope and again on garden hose Holds on to furniture etc when up at hs. Feet will hurt and eases as he walks  Scraped his hand and hurt his wrist   Is the patient's home free of loose throw rugs in walkways, pet beds, electrical cords, etc?   yes      Grab bars in the bathroom? Yes       Handrails on the stairs?   No stairs      Adequate lighting?  Yes   Timed Get Up and Go performed: slow - 25 seconds mostly due to foot pain. Does not shuffle as yet   Cognitive Function: MMSE - Mini Mental State Exam 01/03/2017  Not completed: (No Data)   Ad8 score reviewed for issues:  Issues making decisions:  Less interest in hobbies / activities:  Repeats questions, stories (family complaining):  Trouble using ordinary gadgets (microwave, computer, phone):  Forgets the month or year:   Mismanaging finances:   Remembering appts:  Daily problems with thinking and/or memory: Ad8 score is=0          Screening Tests Health Maintenance  Topic  Date Due  . COLONOSCOPY  05/19/2026  . TETANUS/TDAP  10/11/2026  . INFLUENZA VACCINE  Completed  . PNA vac Low Risk Adult  Completed          Plan:     PCP Notes   Health Maintenance There are no preventive care reminders to display for this patient. Educated regarding the shingrix  Set a health goal to go to the gym;  Try the bike  Abnormal Screens  No; BP 132 80  Referrals  Awaiting AAA ordered by Dr. Yong Channel; will call and make an apt  Patient concerns;   Nurse Concerns; C/o of achilles tendon pain at times, educated on stretches bid am and pm. Has orthopedics inserts  May call for recommendations Dr. Yong Channel may have to try to prevent this Or make an apt to fup as needed. States this comes and goes. Get up and go 25 sec; still heel to ball but slow gait;   Going to [place] in May on a fishing trip. Is considering injections to right ankle; to make an apt early spring to discuss with Dr. Retail banker  Next PCP apt - [facility]      I have personally reviewed and noted the following in the patient's chart:   . Medical and social history . Use of alcohol, tobacco or illicit drugs  . Current medications and supplements . Functional ability and status . Nutritional status . Physical activity . Advanced directives . List of other physicians . Hospitalizations, surgeries, and ER visits in previous 12 months . Vitals . Screenings to include cognitive, depression, and  falls . Referrals and appointments  In addition, I have reviewed and discussed with patient certain preventive protocols, quality metrics, and best practice recommendations. A written personalized care plan for preventive services as well as general preventive health recommendations were provided to patient.     Wynetta Fines, RN   01/03/2017

## 2017-01-03 NOTE — Patient Instructions (Addendum)
[name] , Thank you for taking time to come for your Medicare Wellness Visit. I appreciate your ongoing commitment to your health goals. Please review the following plan we discussed and let me know if I can assist you in the future.   Shingrix is a vaccine for the prevention of Shingles in Adults 50 and older.  If you are on Medicare, you can request a prescription from your doctor to be filled at a pharmacy.  Please check with your benefits regarding applicable copays or out of pocket expenses.  The Shingrix is given in 2 vaccines approx 8 weeks apart. You must receive the 2nd dose prior to 6 months from receipt of the first.    It would be helpful to have copy of your living will and HCPOA for the chart.   These are the goals we discussed: Goals    . Exercise 150 min/wk Moderate Activity     Stretching in the am  Riding the bike in the gym; wife will retire in 30 days and you both can go to the gym       This is a list of the screening recommended for you and due dates:  Health Maintenance  Topic Date Due  . Colon Cancer Screening  05/19/2026  . Tetanus Vaccine  10/11/2026  . Flu Shot  Completed  . Pneumonia vaccines  Completed   Prevention of falls: Remove rugs or any tripping hazards in the home Use Non slip mats in bathtubs and showers Placing grab bars next to the toilet and or shower Placing handrails on both sides of the stair way Adding extra lighting in the home.   Personal safety issues reviewed:  1. Consider starting a community watch program per [facility] [address] 2.  Changes batteries is smoke detector and/or carbon monoxide detector  3.  If you have firearms; keep them in a safe place 4.  Wear protection when in the sun; Always wear sunscreen or a hat; It is good to have your doctor check your skin annually or review any new areas of concern 5. Driving safety; Keep in the right lane; stay 3 car lengths behind the car in front of you on the highway;  look 3 times prior to pulling out; carry your cell phone everywhere you go!        Fall Prevention in the Home Falls can cause injuries. They can happen to people of all ages. There are many things you can do to make your home safe and to help prevent falls. What can I do on the outside of my home?  Regularly fix the edges of walkways and driveways and fix any cracks.  Remove anything that might make you trip as you walk through a door, such as a raised step or threshold.  Trim any bushes or trees on the path to your home.  Use bright outdoor lighting.  Clear any walking paths of anything that might make someone trip, such as rocks or tools.  Regularly check to see if handrails are loose or broken. Make sure that both sides of any steps have handrails.  Any raised decks and porches should have guardrails on the edges.  Have any leaves, snow, or ice cleared regularly.  Use sand or salt on walking paths during winter.  Clean up any spills in your garage right away. This includes oil or grease spills. What can I do in the bathroom?  Use night lights.  Install grab bars by the toilet  and in the tub and shower. Do not use towel bars as grab bars.  Use non-skid mats or decals in the tub or shower.  If you need to sit down in the shower, use a plastic, non-slip stool.  Keep the floor dry. Clean up any water that spills on the floor as soon as it happens.  Remove soap buildup in the tub or shower regularly.  Attach bath mats securely with double-sided non-slip rug tape.  Do not have throw rugs and other things on the floor that can make you trip. What can I do in the bedroom?  Use night lights.  Make sure that you have a light by your bed that is easy to reach.  Do not use any sheets or blankets that are too big for your bed. They should not hang down onto the floor.  Have a firm chair that has side arms. You can use this for support while you get dressed.  Do not  have throw rugs and other things on the floor that can make you trip. What can I do in the kitchen?  Clean up any spills right away.  Avoid walking on wet floors.  Keep items that you use a lot in easy-to-reach places.  If you need to reach something above you, use a strong step stool that has a grab bar.  Keep electrical cords out of the way.  Do not use floor polish or wax that makes floors slippery. If you must use wax, use non-skid floor wax.  Do not have throw rugs and other things on the floor that can make you trip. What can I do with my stairs?  Do not leave any items on the stairs.  Make sure that there are handrails on both sides of the stairs and use them. Fix handrails that are broken or loose. Make sure that handrails are as long as the stairways.  Check any carpeting to make sure that it is firmly attached to the stairs. Fix any carpet that is loose or worn.  Avoid having throw rugs at the top or bottom of the stairs. If you do have throw rugs, attach them to the floor with carpet tape.  Make sure that you have a light switch at the top of the stairs and the bottom of the stairs. If you do not have them, ask someone to add them for you. What else can I do to help prevent falls?  Wear shoes that: ? Do not have high heels. ? Have rubber bottoms. ? Are comfortable and fit you well. ? Are closed at the toe. Do not wear sandals.  If you use a stepladder: ? Make sure that it is fully opened. Do not climb a closed stepladder. ? Make sure that both sides of the stepladder are locked into place. ? Ask someone to hold it for you, if possible.  Clearly mark and make sure that you can see: ? Any grab bars or handrails. ? First and last steps. ? Where the edge of each step is.  Use tools that help you move around (mobility aids) if they are needed. These include: ? Canes. ? Walkers. ? Scooters. ? Crutches.  Turn on the lights when you go into a dark area. Replace  any light bulbs as soon as they burn out.  Set up your furniture so you have a clear path. Avoid moving your furniture around.  If any of your floors are uneven, fix them.  If there  are any pets around you, be aware of where they are.  Review your medicines with your doctor. Some medicines can make you feel dizzy. This can increase your chance of falling. Ask your doctor what other things that you can do to help prevent falls. This information is not intended to replace advice given to you by your health care provider. Make sure you discuss any questions you have with your health care provider. Document Released: 10/28/2008 Document Revised: 06/09/2015 Document Reviewed: 02/05/2014 Elsevier Interactive Patient Education  2018 [place] Maintenance, Male A healthy lifestyle and preventive care is important for your health and wellness. Ask your health care provider about what schedule of regular examinations is right for you. What should I know about weight and diet? Eat a Healthy Diet  Eat plenty of vegetables, fruits, whole grains, low-fat dairy products, and lean protein.  Do not eat a lot of foods high in solid fats, added sugars, or salt.  Maintain a Healthy Weight Regular exercise can help you achieve or maintain a healthy weight. You should:  Do at least 150 minutes of exercise each week. The exercise should increase your heart rate and make you sweat (moderate-intensity exercise).  Do strength-training exercises at least twice a week.  Watch Your Levels of Cholesterol and Blood Lipids  Have your blood tested for lipids and cholesterol every 5 years starting at 66 years of age. If you are at high risk for heart disease, you should start having your blood tested when you are 67 years old. You may need to have your cholesterol levels checked more often if: ? Your lipid or cholesterol levels are high. ? You are older than 67 years of age. ? You are at high risk  for heart disease.  What should I know about cancer screening? Many types of cancers can be detected early and may often be prevented. Lung Cancer  You should be screened every year for lung cancer if: ? You are a current smoker who has smoked for at least 30 years. ? You are a former smoker who has quit within the past 15 years.  Talk to your health care provider about your screening options, when you should start screening, and how often you should be screened.  Colorectal Cancer  Routine colorectal cancer screening usually begins at 66 years of age and should be repeated every 5-10 years until you are 66 years old. You may need to be screened more often if early forms of precancerous polyps or small growths are found. Your health care provider may recommend screening at an earlier age if you have risk factors for colon cancer.  Your health care provider may recommend using home test kits to check for hidden blood in the stool.  A small camera at the end of a tube can be used to examine your colon (sigmoidoscopy or colonoscopy). This checks for the earliest forms of colorectal cancer.  Prostate and Testicular Cancer  Depending on your age and overall health, your health care provider may do certain tests to screen for prostate and testicular cancer.  Talk to your health care provider about any symptoms or concerns you have about testicular or prostate cancer.  Skin Cancer  Check your skin from head to toe regularly.  Tell your health care provider about any new moles or changes in moles, especially if: ? There is a change in a mole's size, shape, or color. ? You have a mole that is larger than  a pencil eraser.  Always use sunscreen. Apply sunscreen liberally and repeat throughout the day.  Protect yourself by wearing long sleeves, pants, a wide-brimmed hat, and sunglasses when outside.  What should I know about heart disease, diabetes, and high blood pressure?  If you are  9-82 years of age, have your blood pressure checked every 3-5 years. If you are 71 years of age or older, have your blood pressure checked every year. You should have your blood pressure measured twice-once when you are at a hospital or clinic, and once when you are not at a hospital or clinic. Record the average of the two measurements. To check your blood pressure when you are not at a hospital or clinic, you can use: ? An automated blood pressure machine at a pharmacy. ? A home blood pressure monitor.  Talk to your health care provider about your target blood pressure.  If you are between 40-85 years old, ask your health care provider if you should take aspirin to prevent heart disease.  Have regular diabetes screenings by checking your fasting blood sugar level. ? If you are at a normal weight and have a low risk for diabetes, have this test once every three years after the age of 71. ? If you are overweight and have a high risk for diabetes, consider being tested at a younger age or more often.  A one-time screening for abdominal aortic aneurysm (AAA) by ultrasound is recommended for men aged 36-75 years who are current or former smokers. What should I know about preventing infection? Hepatitis B If you have a higher risk for hepatitis B, you should be screened for this virus. Talk with your health care provider to find out if you are at risk for hepatitis B infection. Hepatitis C Blood testing is recommended for:  Everyone born from [birth year] through 1965.  Anyone with known risk factors for hepatitis C.  Sexually Transmitted Diseases (STDs)  You should be screened each year for STDs including gonorrhea and chlamydia if: ? You are sexually active and are younger than 66 years of age. ? You are older than 66 years of age and your health care provider tells you that you are at risk for this type of infection. ? Your sexual activity has changed since you were last screened and you are at  an increased risk for chlamydia or gonorrhea. Ask your health care provider if you are at risk.  Talk with your health care provider about whether you are at high risk of being infected with HIV. Your health care provider may recommend a prescription medicine to help prevent HIV infection.  What else can I do?  Schedule regular health, dental, and eye exams.  Stay current with your vaccines (immunizations).  Do not use any tobacco products, such as cigarettes, chewing tobacco, and e-cigarettes. If you need help quitting, ask your health care provider.  Limit alcohol intake to no more than 2 drinks per day. One drink equals 12 ounces of beer, 5 ounces of wine, or 1 ounces of hard liquor.  Do not use street drugs.  Do not share needles.  Ask your health care provider for help if you need support or information about quitting drugs.  Tell your health care provider if you often feel depressed.  Tell your health care provider if you have ever been abused or do not feel safe at home. This information is not intended to replace advice given to you by your health care provider. Make  sure you discuss any questions you have with your health care provider. Document Released: 06/30/2007 Document Revised: 08/31/2015 Document Reviewed: 10/05/2014 Elsevier Interactive Patient Education  [employer].

## 2017-01-19 ENCOUNTER — Other Ambulatory Visit: Payer: Self-pay | Admitting: Family Medicine

## 2017-02-01 ENCOUNTER — Encounter: Payer: Self-pay | Admitting: Family Medicine

## 2017-02-02 ENCOUNTER — Other Ambulatory Visit: Payer: Self-pay | Admitting: Family Medicine

## 2017-02-13 ENCOUNTER — Other Ambulatory Visit: Payer: Self-pay | Admitting: Family Medicine

## 2017-04-22 ENCOUNTER — Other Ambulatory Visit: Payer: Self-pay | Admitting: Family Medicine

## 2017-06-20 ENCOUNTER — Other Ambulatory Visit: Payer: Self-pay | Admitting: Family Medicine

## 2017-07-21 ENCOUNTER — Other Ambulatory Visit: Payer: Self-pay | Admitting: Family Medicine

## 2017-08-26 ENCOUNTER — Encounter: Payer: Self-pay | Admitting: Family Medicine

## 2017-09-09 ENCOUNTER — Telehealth: Payer: Self-pay | Admitting: Family Medicine

## 2017-09-09 NOTE — Telephone Encounter (Signed)
Please see message and advise 

## 2017-09-09 NOTE — Telephone Encounter (Signed)
Copied from [place] [phone]. Topic: Quick Communication - See Telephone Encounter >> Sep 09, 2017  8:29 AM Margot Ables wrote: CRM for notification. See Telephone encounter for: 09/09/17.  Pt was scheduled for aorta US 10/2016 and had to cancel - please enter new order and contact him for scheduling before he comes back to see Dr. Yong Channel this December  Pt states that Dr. Fredderick Phenix at [facility] stated he would send a note to Dr. Yong Channel about pts acid reflux. Pt asking if he needs to come in to see Dr. Yong Channel or if he needs referral to GI. Please advise.

## 2017-09-09 NOTE — Telephone Encounter (Signed)
Please call pt and schedule appointment per Dr. Yong Channel so he discuss pt's concerns.

## 2017-09-09 NOTE — Telephone Encounter (Signed)
Probably best to sit down and chat. That way we can go over both concerns.   From last years note "Return in about 6 months (around 04/09/2017) for follow up- or sooner if needed."

## 2017-09-13 ENCOUNTER — Encounter: Payer: Self-pay | Admitting: Family Medicine

## 2017-09-13 ENCOUNTER — Ambulatory Visit: Payer: Medicare Other | Admitting: Family Medicine

## 2017-09-13 VITALS — BP 110/60 | HR 73 | Temp 98.9°F | Ht 67.0 in | Wt 219.4 lb

## 2017-09-13 DIAGNOSIS — E785 Hyperlipidemia, unspecified: Secondary | ICD-10-CM

## 2017-09-13 DIAGNOSIS — I10 Essential (primary) hypertension: Secondary | ICD-10-CM

## 2017-09-13 DIAGNOSIS — Z87891 Personal history of nicotine dependence: Secondary | ICD-10-CM

## 2017-09-13 DIAGNOSIS — K219 Gastro-esophageal reflux disease without esophagitis: Secondary | ICD-10-CM

## 2017-09-13 MED ORDER — OMEPRAZOLE 40 MG PO CPDR: 40.0000 mg | DELAYED_RELEASE_CAPSULE | Freq: Every day | ORAL | 1 refills | Status: DC

## 2017-09-13 NOTE — Progress Notes (Signed)
Subjective:  Matthew Watson is a 67 y.o. year old very pleasant male patient who presents for/with See problem oriented charting ROS- No chest pain or shortness of breath. No headache or blurry vision.    Past Medical History-  Patient Active Problem List   Diagnosis Date Noted  . Chronic narcotic use 10/10/2016    Priority: High  . Arthritis of right ankle 02/11/2014    Priority: Medium  . BPH (benign prostatic hyperplasia) 12/17/2006    Priority: Medium  . Hyperlipidemia 08/19/2006    Priority: Medium  . Essential hypertension 08/19/2006    Priority: Medium  . Allergic rhinitis 02/11/2014    Priority: Low  . Family history of melanoma 02/11/2014    Priority: Low  . GERD (gastroesophageal reflux disease) 02/11/2014    Priority: Low  . Former smoker 02/11/2014    Priority: Low  . INSOMNIA, CHRONIC 07/01/2009    Priority: Low  . Actinic keratosis 12/17/2006    Priority: Low  . History of colonic polyps 08/26/2006    Priority: Low  . Arthritis of left foot 08/27/2016    Medications- reviewed and updated Current Outpatient Medications  Medication Sig Dispense Refill  . amLODipine-benazepril (LOTREL) 5-20 MG capsule TAKE ONE CAPSULE BY MOUTH DAILY 90 capsule 0  . atorvastatin (LIPITOR) 40 MG tablet TAKE 1 TABLET (40 MG TOTAL) BY MOUTH DAILY. 90 tablet 0  . finasteride (PROSCAR) 5 MG tablet TAKE 1 TABLET (5 MG TOTAL) BY MOUTH DAILY. 90 tablet 0  . fluticasone (FLONASE) 50 MCG/ACT nasal spray SPRAY TWO SPRAYS IN EACH NOSTRIL ONCE DAILY 16 g 1  . meloxicam (MOBIC) 15 MG tablet TAKE 1 TABLET (15 MG TOTAL) BY MOUTH DAILY. 90 tablet 1  . omeprazole (PRILOSEC) 40 MG capsule Take 1 capsule (40 mg total) by mouth daily. 90 capsule 1  . levocetirizine (XYZAL) 5 MG tablet      No current facility-administered medications for this visit.     Objective: BP 110/60   Pulse 73   Temp 98.9 F (37.2 C) (Oral)   Ht 5\' 7"  (1.702 m)   Wt 219 lb 6.4 oz (99.5 kg)   SpO2 96%   BMI  34.36 kg/m  Gen: NAD, resting comfortably Oropharynx largely normal, nares largely normal CV: RRR no murmurs rubs or gallops Lungs: CTAB no crackles, wheeze, rhonchi Abdomen: soft/nontender/nondistended/normal bowel sounds.  Ext: no edema Skin: warm, dry  Assessment/Plan:  Other notes: 1. former smoker- needs to reschedule AAA scan- will order again.   Hypertension S: controlled on  amlodipine-benazepril 5-20mg  BP Readings from Last 3 Encounters:  09/13/17 110/60  01/03/17 132/80  10/10/16 118/76  A/P: We discussed blood pressure goal of <140/90. Continue current meds  Hyperlipidemia S: well controlled on atorvastatin 40mg  with last LDL of 72 . A/P: continue current rx.   GERD (gastroesophageal reflux disease) S:  patient seen by allergist Dr. Fredderick Phenix- patient reported choking sensation in AM - allergist state dnot allergy related and instead due to reflux and advisd GI referral. No burning in the chest. Already on omeprazole 20 mg.   Anytime he falls asleep even if for a few minutes- wakes up and feels congestion in the throat, like it needs to be cleared up. Better if sitting up, worse if laying down. Seems to be the worst in the AM. No issues when lays down- falls right to sleep. Starts to gag on it. Hot coffee seems to help some. Hot shower seems to help him cough it up.  Does neil med neti pot before he gets in the shower and also does in the evening and that doesn't tend to help much but doing as allergiest recommended.  A/P: I do think this could be reflux related. From avs  "Take 40mg  omeprazole in AM for 1 week If doesn't calm things down- try before dinner for 1 week  If still no improvement- let me know and I will refer you to GI"  Future Appointments  Date Time Provider [place]  01/02/2018  9:30 AM Marin Olp, MD [facility]   Lab/Order associations: Former smoker - Plan: [facility] US [facility] MEDICARE SCREEN  Hyperlipidemia, unspecified  hyperlipidemia type  Essential hypertension  Gastroesophageal reflux disease without esophagitis  Meds ordered this encounter  Medications  . omeprazole (PRILOSEC) 40 MG capsule    Sig: Take 1 capsule (40 mg total) by mouth daily.    Dispense:  90 capsule    Refill:  1    Return precautions advised.  Garret Reddish, MD

## 2017-09-13 NOTE — Patient Instructions (Addendum)
Health Maintenance Due  Topic Date Due  . INFLUENZA VACCINE Patient will call office in October for Flu vaccine.  08/15/2017   We will call you within two weeks about your referral to aneurysm screening. If you do not hear within 3 weeks, give [place] a call.    Take 40mg  omeprazole in AM for 1 week If doesn't calm things down- try before dinner for 1 week  If still no improvement- let me know and I will refer you to GI  See you in dcember or sooner if needed

## 2017-09-14 NOTE — Assessment & Plan Note (Signed)
S:  patient seen by allergist Dr. Fredderick Phenix- patient reported choking sensation in AM - allergist state dnot allergy related and instead due to reflux and advisd GI referral. No burning in the chest. Already on omeprazole 20 mg.   Anytime he falls asleep even if for a few minutes- wakes up and feels congestion in the throat, like it needs to be cleared up. Better if sitting up, worse if laying down. Seems to be the worst in the AM. No issues when lays down- falls right to sleep. Starts to gag on it. Hot coffee seems to help some. Hot shower seems to help him cough it up. Does neil med neti pot before he gets in the shower and also does in the evening and that doesn't tend to help much but doing as allergiest recommended.  A/P: I do think this could be reflux related. From avs  "Take 40mg  omeprazole in AM for 1 week If doesn't calm things down- try before dinner for 1 week  If still no improvement- let me know and I will refer you to GI"

## 2017-09-27 ENCOUNTER — Ambulatory Visit (HOSPITAL_COMMUNITY)
Admission: RE | Admit: 2017-09-27 | Discharge: 2017-09-27 | Disposition: A | Discharge status: Home / Self Care | Payer: Medicare Other | Source: Ambulatory Visit | Attending: Cardiovascular Disease | Admitting: Cardiovascular Disease

## 2017-09-27 DIAGNOSIS — Z87891 Personal history of nicotine dependence: Secondary | ICD-10-CM | POA: Diagnosis present

## 2017-10-30 ENCOUNTER — Ambulatory Visit: Payer: Medicare Other | Admitting: Family Medicine

## 2017-10-30 ENCOUNTER — Encounter: Payer: Self-pay | Admitting: Family Medicine

## 2017-10-30 VITALS — BP 118/64 | HR 72 | Temp 98.6°F | Ht 67.0 in | Wt 217.4 lb

## 2017-10-30 DIAGNOSIS — R05 Cough: Secondary | ICD-10-CM

## 2017-10-30 DIAGNOSIS — R059 Cough, unspecified: Secondary | ICD-10-CM

## 2017-10-30 DIAGNOSIS — J40 Bronchitis, not specified as acute or chronic: Secondary | ICD-10-CM | POA: Diagnosis not present

## 2017-10-30 MED ORDER — BENZONATATE 200 MG PO CAPS: 200.0000 mg | ORAL_CAPSULE | Freq: Two times a day (BID) | ORAL | 0 refills | Status: DC | PRN

## 2017-10-30 MED ORDER — IPRATROPIUM BROMIDE 0.06 % NA SOLN: 2.0000 | Freq: Four times a day (QID) | NASAL | 0 refills | Status: DC

## 2017-10-30 MED ORDER — AZITHROMYCIN 250 MG PO TABS: ORAL_TABLET | ORAL | 0 refills | Status: DC

## 2017-10-30 NOTE — Patient Instructions (Signed)
Start the atrovent and zpack.  Start tessalon for your cough.  Please stay well hydrated.  You can take tylenol and/or motrin as needed for low grade fever and pain.  Please let me know if your symptoms worsen or fail to improve.  Take care, Dr Parker  

## 2017-10-30 NOTE — Progress Notes (Signed)
   Subjective:  Matthew Watson is a 67 y.o. male who presents today for same-day appointment with a chief complaint of cough.   HPI:  Cough, Acute problem Started last week.  Worsened over that time.  Associated with rhinorrhea, sinus congestion, malaise, fatigue, and sputum production.  Wife is been sick with similar symptoms.  He has tried taking acetaminophen and guaifenesin with modest improvement in his symptoms.  Had fever the first couple of days which is since resolved.  Symptoms are worse in the morning.  No other specific treatments tried.  No other obvious alleviating or aggravating factors.  ROS: Per HPI  PMH: He reports that he quit smoking about 27 years ago. His smoking use included cigarettes. He has a 2.50 pack-year smoking history. He has never used smokeless tobacco. He reports that he drinks about 6.0 standard drinks of alcohol per week. He reports that he does not use drugs.  Objective:  Physical Exam: BP 118/64 (BP Location: Left Arm, Patient Position: Sitting, Cuff Size: Normal)   Pulse 72   Temp 98.6 F (37 C) (Oral)   Ht 5\' 7"  (1.702 m)   Wt 217 lb 6.4 oz (98.6 kg)   SpO2 98%   BMI 34.05 kg/m   Gen: NAD, resting comfortably HEENT: TMs with clear effusion bilaterally.  Oropharynx erythematous without exudate.  Nasal mucosa boggy and erythematous bilaterally with thick, white nasal discharge. CV: RRR with no murmurs appreciated Pulm: NWOB, scattered wheezes and rhonchi throughout all lung fields.  Good airflow throughout.  Assessment/Plan:  Cough/bronchitis Exam consistent with bronchitis.  Given that symptoms have been persistent for the past week and seem to be worsening, we will start a course of azithromycin today.  Also start Atrovent for his rhinorrhea and nasal congestion.  We will send in Tessalon for his cough.  Recommended good oral hydration.  Also recommended Tylenol and/or Motrin as needed.  Discussed reasons to return to care.  Follow-up as  needed.  Algis Greenhouse. Jerline Pain, MD 10/30/2017 11:59 AM

## 2017-11-06 ENCOUNTER — Other Ambulatory Visit: Payer: Self-pay | Admitting: Family Medicine

## 2017-11-06 NOTE — Telephone Encounter (Signed)
Per OV note 09/13/2017, Omeprazole change to 40 mg.

## 2017-11-19 ENCOUNTER — Ambulatory Visit (INDEPENDENT_AMBULATORY_CARE_PROVIDER_SITE_OTHER): Payer: Medicare Other

## 2017-11-19 DIAGNOSIS — Z23 Encounter for immunization: Secondary | ICD-10-CM | POA: Diagnosis not present

## 2017-11-27 ENCOUNTER — Other Ambulatory Visit: Payer: Self-pay | Admitting: Family Medicine

## 2018-01-02 ENCOUNTER — Encounter: Payer: Self-pay | Admitting: Family Medicine

## 2018-01-02 ENCOUNTER — Ambulatory Visit (INDEPENDENT_AMBULATORY_CARE_PROVIDER_SITE_OTHER): Payer: Medicare Other | Admitting: Family Medicine

## 2018-01-02 VITALS — BP 126/70 | HR 65 | Temp 98.2°F | Ht 67.0 in | Wt 220.4 lb

## 2018-01-02 DIAGNOSIS — Z79899 Other long term (current) drug therapy: Secondary | ICD-10-CM

## 2018-01-02 DIAGNOSIS — Z0000 Encounter for general adult medical examination without abnormal findings: Secondary | ICD-10-CM

## 2018-01-02 DIAGNOSIS — K219 Gastro-esophageal reflux disease without esophagitis: Secondary | ICD-10-CM

## 2018-01-02 DIAGNOSIS — N401 Enlarged prostate with lower urinary tract symptoms: Secondary | ICD-10-CM | POA: Diagnosis not present

## 2018-01-02 DIAGNOSIS — F1190 Opioid use, unspecified, uncomplicated: Secondary | ICD-10-CM

## 2018-01-02 DIAGNOSIS — R351 Nocturia: Secondary | ICD-10-CM | POA: Diagnosis not present

## 2018-01-02 DIAGNOSIS — E785 Hyperlipidemia, unspecified: Secondary | ICD-10-CM | POA: Diagnosis not present

## 2018-01-02 DIAGNOSIS — Z6834 Body mass index (BMI) 34.0-34.9, adult: Secondary | ICD-10-CM

## 2018-01-02 DIAGNOSIS — M19071 Primary osteoarthritis, right ankle and foot: Secondary | ICD-10-CM

## 2018-01-02 DIAGNOSIS — I10 Essential (primary) hypertension: Secondary | ICD-10-CM

## 2018-01-02 LAB — COMPREHENSIVE METABOLIC PANEL
ALBUMIN: 4.4 g/dL (ref 3.5–5.2)
ALT: 21 U/L (ref 0–53)
AST: 17 U/L (ref 0–37)
Alkaline Phosphatase: 97 U/L (ref 39–117)
BUN: 18 mg/dL (ref 6–23)
CO2: 27 mEq/L (ref 19–32)
Calcium: 9.5 mg/dL (ref 8.4–10.5)
Chloride: 103 mEq/L (ref 96–112)
Creatinine, Ser: 0.93 mg/dL (ref 0.40–1.50)
GFR: 85.9 mL/min (ref >60.00)
Glucose, Bld: 82 mg/dL (ref 70–99)
Potassium: 4.3 mEq/L (ref 3.5–5.1)
SODIUM: 140 meq/L (ref 135–145)
Total Bilirubin: 0.7 mg/dL (ref 0.2–1.2)
Total Protein: 7 g/dL (ref 6.0–8.3)

## 2018-01-02 LAB — VITAMIN B12: Vitamin B-12: 288 pg/mL (ref 211–911)

## 2018-01-02 LAB — CBC
HCT: 41.4 % (ref 39.0–52.0)
Hemoglobin: 13.7 g/dL (ref 13.0–17.0)
MCHC: 33.2 g/dL (ref 30.0–36.0)
MCV: 84.3 fl (ref 78.0–100.0)
Platelets: 235 10*3/uL (ref 150.0–400.0)
RBC: 4.91 Mil/uL (ref 4.22–5.81)
RDW: 14.9 % (ref 11.5–15.5)
WBC: 8.9 10*3/uL (ref 4.0–10.5)

## 2018-01-02 LAB — LIPID PANEL
CHOLESTEROL: 163 mg/dL (ref 0–200)
HDL: 52.2 mg/dL (ref >39.00)
LDL Cholesterol: 81 mg/dL (ref 0–99)
NonHDL: 111.03
Total CHOL/HDL Ratio: 3
Triglycerides: 150 mg/dL — ABNORMAL HIGH (ref 0.0–149.0)
VLDL: 30 mg/dL (ref 0.0–40.0)

## 2018-01-02 LAB — PSA: PSA: 0.19 ng/mL (ref 0.10–4.00)

## 2018-01-02 MED ORDER — HYDROCODONE-ACETAMINOPHEN 5-325 MG PO TABS: ORAL_TABLET | ORAL | 0 refills | Status: DC

## 2018-01-02 NOTE — Patient Instructions (Addendum)
Please stop by lab before you go   No changes today other than I do want you to try to find a way to exercise during the colder months.  Great job biking in the warmer months.  Try to target 150 minutes of exercise per week.  Would love to see even just 5 pounds off by follow-up visit.  That would get you back to where you were a year ago at your physical  Let me know when you are ready for referral to Dr. Paulla Fore for orthotics   Great to see you and hope you have a Merry Christmas!  Garret Reddish

## 2018-01-02 NOTE — Addendum Note (Signed)
Addended by: Kayren Eaves T on: 01/02/2018 10:35 AM   Modules accepted: Orders

## 2018-01-02 NOTE — Assessment & Plan Note (Addendum)
Patient with chronic right ankle arthritis/pain-meloxicam is helpful.  He also uses very sparing hydrocodone for this when very severe- winter seems to be worse.  Typically #45 lasting up to a year. Saw Dr. [place] Maxon- surgeon- back and he states not much that can be done for it.  - refill #45 today - [facility] reviewed and last fill from me 10/10/16- prior to that had been provided hycodan cough syrup by Dorothyann Peng within [place]- but no other fills.  - UDS today - controlled substance contract last year  - wants to see Dr. Paulla Fore back next year for more orthostics- he will reach out to me for referral when ready

## 2018-01-02 NOTE — Progress Notes (Signed)
Phone: [phone]  Subjective:  Patient presents today for their annual physical. Chief complaint-noted.   See problem oriented charting- ROS- full  review of systems was completed and negative except for: post nasal drip, runny nose, sneezing, eye itching, joint pain- mainly ankle, some swelling there, seasonal allregies, mild anxiety, some dizziness- if looks up sharply has to hold onto something- no recent worsening  The following were reviewed and entered/updated in epic: Past Medical History:  Diagnosis Date  . Adenomatous polyp of colon   . Allergy    seasonal  . Arthritis   . BPH (benign prostatic hyperplasia)   . Diverticulosis 09/2010   episode of diverticulitis  . GERD (gastroesophageal reflux disease)   . Hyperlipidemia   . Hypertension   . Low back pain    Patient Active Problem List   Diagnosis Date Noted  . Chronic narcotic use 10/10/2016    Priority: High  . Arthritis of right ankle 02/11/2014    Priority: Medium  . BPH (benign prostatic hyperplasia) 12/17/2006    Priority: Medium  . Hyperlipidemia 08/19/2006    Priority: Medium  . Essential hypertension 08/19/2006    Priority: Medium  . Allergic rhinitis 02/11/2014    Priority: Low  . Family history of melanoma 02/11/2014    Priority: Low  . GERD (gastroesophageal reflux disease) 02/11/2014    Priority: Low  . Former smoker 02/11/2014    Priority: Low  . INSOMNIA, CHRONIC 07/01/2009    Priority: Low  . Actinic keratosis 12/17/2006    Priority: Low  . History of colonic polyps 08/26/2006    Priority: Low  . Arthritis of left foot 08/27/2016   Past Surgical History:  Procedure Laterality Date  . ANKLE FRACTURE SURGERY     chronic pain as result  . COLONOSCOPY    . HERNIA REPAIR     jan 2012, [place]    Family History  Problem Relation Age of Onset  . Cancer Father        melanoma, cousin also died of it  . Cancer Brother        melanoma  . Colon polyps Sister   . Cancer Paternal  Uncle        Colon cancer  . Colon cancer Paternal Uncle     Medications- reviewed and updated Current Outpatient Medications  Medication Sig Dispense Refill  . amLODipine-benazepril (LOTREL) 5-20 MG capsule TAKE 1 CAPSULE(S) BY MOUTH DAILY 90 capsule 0  . atorvastatin (LIPITOR) 40 MG tablet TAKE 1 TABLET BY MOUTH DAILY 90 tablet 0  . Azelastine HCl 0.15 % SOLN Place 1 spray into the nose as needed.    . finasteride (PROSCAR) 5 MG tablet TAKE 1 TABLET BY MOUTH DAILY 90 tablet 0  . fluticasone (FLONASE) 50 MCG/ACT nasal spray SPRAY TWO SPRAYS IN EACH NOSTRIL ONCE DAILY 16 g 1  . levocetirizine (XYZAL) 5 MG tablet     . meloxicam (MOBIC) 15 MG tablet TAKE 1 TABLET (15 MG TOTAL) BY MOUTH DAILY. 90 tablet 0  . omeprazole (PRILOSEC) 40 MG capsule Take 1 capsule (40 mg total) by mouth daily. 90 capsule 1  . HYDROcodone-acetaminophen (NORCO/VICODIN) 5-325 MG tablet TAKE 1/2-1 TABLET BY MOUTH EVERY 8 HOURS AS NEEDED FOR MODERATE PAIN. Max 5 per week. 45 tablet 0   No current facility-administered medications for this visit.     Allergies-reviewed and updated No Known Allergies  Social History   Social History Narrative   Married >30 years in 2016. No children. 2  dogs.       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 Oropharynx normal Neck: no thyromegaly CV: RRR no murmurs rubs or gallops Lungs: CTAB no crackles, wheeze, rhonchi Abdomen: soft/nontender/nondistended/normal bowel sounds. No rebound or guarding.  Ext: no edema Skin: warm, dry Neuro: grossly normal, moves all extremities, PERRLA Rectal: normal tone, diffusely enlarged prostate (even with  finasteride), no masses or tenderness  Assessment/Plan:  67 y.o. male presenting for annual physical.  Health Maintenance counseling: 1. Anticipatory guidance: Patient counseled regarding regular dental exams -q6 months, eye exams - twice yearly,  avoiding smoking and second hand smoke, limiting alcohol to 2 beverages per day - he cut beer out as irritated reflux.   2. Risk factor reduction:  Advised patient of need for regular exercise and diet rich and fruits and vegetables to reduce risk of heart attack and stroke. Exercise- bike riding twice a week does outside- better with warm weather. Diet--weight up 4 pounds from last physical- he feels food choices are pretty good- we did discuss cutting out juices due to empty calorie portion .  Wt Readings from Last 3 Encounters:  01/02/18 220 lb 6.4 oz (100 kg)  10/30/17 217 lb 6.4 oz (98.6 kg)  09/13/17 219 lb 6.4 oz (99.5 kg)  3. Immunizations/screenings/ancillary studies-already had flu shot this year-thanked him for doing this.  Already had Shingrix as well Immunization History  Administered Date(s) Administered  . Influenza Split 11/21/2011  . Influenza Whole 11/18/2008, 01/11/2010  . Influenza, High Dose Seasonal PF 12/20/2015, 11/19/2016, 11/19/2017  . Influenza,inj,Quad PF,6+ Mos 11/11/2012, 11/18/2013, 12/07/2014  . Pneumococcal Conjugate-13 02/15/2015  . Pneumococcal Polysaccharide-23 02/16/2016  . Td 01/15/2005  . Tdap 10/10/2016  . Zoster 07/14/2012  . Zoster Recombinat (Shingrix) 05/21/2017, 08/13/2017  4. Prostate cancer screening- low risk PSA trend in the past-update PSA again today.  BPH on exam without nodules or asymmetry  Lab Results  Component Value Date   PSA 0.20 10/10/2016   PSA 0.17 08/09/2015   PSA 0.24 07/07/2012   5. Colon cancer screening - 05/28/2016 with 10-year follow-up per GI-uncles with colon cancer but no first-degree relatives so GI did not recommend sooner follow-up 6. Skin cancer screening-sees Dr.  Ledell Peoples office yearly.  Advised regular sunscreen use. Denies worrisome, changing, or new skin lesions.  7.  Former smoker-quit 1992 with 2.5 pack years.  AAA screen negative in 2019  Status of chronic or acute concerns   Hypertension-controlled on amlodipine and benazepril 5-20 mg   Hyperlipidemia-has been controlled on atorvastatin 40 mg-update lipids today   Seasonal allergies-on Astelin, Xyzal, Flonase.  Doing reasonably well- still gets some symptoms   Patient is on finasteride 5 mg for BPH-reasonable control of symptoms.  Still with nocturia about once a night-stable urinary symptoms.  Worse if he drinks alcohol near bed- has avoided and doing well  GERD-reasonable control on omeprazole now- just has to be careful with food choices.  Due to high risk medication use we will check B12 today   Arthritis of right ankle Patient with chronic right ankle arthritis/pain-meloxicam is  helpful.  He also uses very sparing hydrocodone for this when very severe- winter seems to be worse.  Typically #45 lasting up to a year. Saw Dr. [place] Maxon- surgeon- back and he states not much that can be done for it.  - refill #45 today - [facility] reviewed and last fill from me 10/10/16- prior to that had been provided hycodan cough syrup by Dorothyann Peng within [place]- but no other fills.  - UDS today - controlled substance contract last year  - wants to see Dr. Paulla Fore back next year for more orthostics- he will reach out to me for referral when ready  Chronic narcotic use See arthritis of right ankle  No future appointments. Return in about 6 months (around 07/04/2018) for follow up- or sooner if needed.  Lab/Order associations: Not fasting Preventative health care - Plan: CBC, Comprehensive metabolic panel, Lipid panel, PSA, Vitamin B12, Pain Mgmt, Profile 8 w/Conf, U  Benign prostatic hyperplasia with nocturia - Plan: PSA  Hyperlipidemia, unspecified hyperlipidemia type - Plan: CBC, Comprehensive  metabolic panel, Lipid panel  Arthritis of right ankle  Essential hypertension  Gastroesophageal reflux disease without esophagitis  BMI 34.0-34.9,adult  High risk medication use - Plan: Vitamin B12, Pain Mgmt, Profile 8 w/Conf, U  Chronic narcotic use  Meds ordered this encounter  Medications  . HYDROcodone-acetaminophen (NORCO/VICODIN) 5-325 MG tablet    Sig: TAKE 1/2-1 TABLET BY MOUTH EVERY 8 HOURS AS NEEDED FOR MODERATE PAIN. Max 5 per week.    Dispense:  45 tablet    Refill:  0    Return precautions advised.  Garret Reddish, MD

## 2018-01-02 NOTE — Assessment & Plan Note (Signed)
See arthritis of right ankle

## 2018-01-05 LAB — PAIN MGMT, PROFILE 8 W/CONF, U
6 Acetylmorphine: NEGATIVE ng/mL (ref <10)
Alcohol Metabolites: POSITIVE ng/mL — AB (ref <500)
Amphetamines: NEGATIVE ng/mL (ref <500)
BENZODIAZEPINES: NEGATIVE ng/mL (ref <100)
Buprenorphine, Urine: NEGATIVE ng/mL (ref <5)
Cocaine Metabolite: NEGATIVE ng/mL (ref <150)
Creatinine: 111.5 mg/dL
ETHYL SULFATE (ETS): 133 ng/mL — AB (ref <100)
Ethyl Glucuronide (ETG): 1190 ng/mL — ABNORMAL HIGH (ref <500)
MDMA: NEGATIVE ng/mL (ref <500)
Marijuana Metabolite: POSITIVE ng/mL — AB (ref <20)
Opiates: NEGATIVE ng/mL (ref <100)
Oxidant: NEGATIVE ug/mL (ref <200)
Oxycodone: NEGATIVE ng/mL (ref <100)
pH: 7.09 (ref 4.5–9.0)

## 2018-01-29 ENCOUNTER — Other Ambulatory Visit: Payer: Self-pay | Admitting: Family Medicine

## 2018-03-13 ENCOUNTER — Other Ambulatory Visit: Payer: Self-pay | Admitting: Family Medicine

## 2018-04-29 ENCOUNTER — Other Ambulatory Visit: Payer: Self-pay | Admitting: Family Medicine

## 2018-04-30 NOTE — Telephone Encounter (Signed)
Rx request 

## 2018-06-11 ENCOUNTER — Other Ambulatory Visit: Payer: Self-pay | Admitting: Family Medicine

## 2018-06-23 ENCOUNTER — Other Ambulatory Visit: Payer: Self-pay

## 2018-06-23 NOTE — Patient Outreach (Signed)
[facility]) Care Management  06/23/2018  Matthew Watson 30-Aug-1950 [id number]   Medication Adherence call to Matthew Watson [facility] Ident[facility] Verify spoke with patient he is past due on Atorvastatin 40 mg and Amlodipine/ Benazepril 5/20 patient explain he received a duplication on this medications at the beginning of the year he received a six month worth and has enough until the end of July patient will order it in when due. Matthew Watson is showing past due under [place].   [facility] Direct Dial [phone]  Fax [phone] [email or web address]

## 2018-07-04 ENCOUNTER — Ambulatory Visit (INDEPENDENT_AMBULATORY_CARE_PROVIDER_SITE_OTHER): Payer: Medicare Other | Admitting: Family Medicine

## 2018-07-04 ENCOUNTER — Encounter: Payer: Self-pay | Admitting: Family Medicine

## 2018-07-04 VITALS — Ht 67.0 in | Wt 220.0 lb

## 2018-07-04 DIAGNOSIS — I10 Essential (primary) hypertension: Secondary | ICD-10-CM | POA: Diagnosis not present

## 2018-07-04 DIAGNOSIS — E785 Hyperlipidemia, unspecified: Secondary | ICD-10-CM

## 2018-07-04 DIAGNOSIS — E669 Obesity, unspecified: Secondary | ICD-10-CM

## 2018-07-04 DIAGNOSIS — N401 Enlarged prostate with lower urinary tract symptoms: Secondary | ICD-10-CM | POA: Diagnosis not present

## 2018-07-04 DIAGNOSIS — K219 Gastro-esophageal reflux disease without esophagitis: Secondary | ICD-10-CM | POA: Diagnosis not present

## 2018-07-04 DIAGNOSIS — H9313 Tinnitus, bilateral: Secondary | ICD-10-CM

## 2018-07-04 DIAGNOSIS — R351 Nocturia: Secondary | ICD-10-CM

## 2018-07-04 MED ORDER — FINASTERIDE 5 MG PO TABS: 5.0000 mg | ORAL_TABLET | Freq: Every day | ORAL | 3 refills | Status: DC

## 2018-07-04 MED ORDER — FLUTICASONE PROPIONATE 50 MCG/ACT NA SUSP: NASAL | 1 refills | Status: AC

## 2018-07-04 MED ORDER — MELOXICAM 15 MG PO TABS: 15.0000 mg | ORAL_TABLET | Freq: Every day | ORAL | 1 refills | Status: DC

## 2018-07-04 MED ORDER — ATORVASTATIN CALCIUM 40 MG PO TABS: 40.0000 mg | ORAL_TABLET | Freq: Every day | ORAL | 3 refills | Status: DC

## 2018-07-04 MED ORDER — OMEPRAZOLE 20 MG PO CPDR: 20.0000 mg | DELAYED_RELEASE_CAPSULE | Freq: Every day | ORAL | 3 refills | Status: DC

## 2018-07-04 MED ORDER — AMLODIPINE BESY-BENAZEPRIL HCL 5-20 MG PO CAPS: 1.0000 | ORAL_CAPSULE | Freq: Every day | ORAL | 3 refills | Status: DC

## 2018-07-04 NOTE — Patient Instructions (Signed)
There are no preventive care reminders to display for this patient.  Depression screen [facility] 2/9 01/02/2018 01/03/2017 02/23/2016  Decreased Interest 0 0 0  Down, Depressed, Hopeless 0 0 0  PHQ - 2 Score 0 0 0

## 2018-07-04 NOTE — Progress Notes (Signed)
Phone [phone]   Subjective:  Virtual visit via Video note. Chief complaint: Chief Complaint  Patient presents with  . Follow-up  . Hypertension  . Hyperlipidemia  . Gastroesophageal Reflux  . Benign Prostatic Hypertrophy   This visit type was conducted due to national recommendations for restrictions regarding the COVID-19 Pandemic (e.g. social distancing).  This format is felt to be most appropriate for this patient at this time balancing risks to patient and risks to population by having him in for in person visit.  No physical exam was performed (except for noted visual exam or audio findings with Telehealth visits).    Our team/I connected with Estrella Deeds at 10:40 AM EDT by a video enabled telemedicine application ([email or web address] or caregility through epic) and verified that I am speaking with the correct person using two identifiers.  Location patient: [facility] Location provider: [facility], office Persons participating in the virtual visit:  Patient  I,Stephen Hunter,acting as a scribe for Garret Reddish, MD.,have documented all relevant documentation on the behalf of Garret Reddish, MD,as directed by  Garret Reddish, MD while in the presence of Garret Reddish, MD.  Our team/I discussed the limitations of evaluation and management by telemedicine and the availability of in person appointments. In light of current covid-19 pandemic, patient also understands that we are trying to protect them by minimizing in office contact if at all possible.  The patient expressed consent for telemedicine visit and agreed to proceed. Patient understands insurance will be billed.   ROS- Denies HA, dizziness, visual changes. No fever, chills, cough, shortness of breath, body aches, sore throat, or loss of taste or smell. No known contacts with covid 19.   Past Medical History-  Patient Active Problem List   Diagnosis Date Noted  . Chronic narcotic use 10/10/2016    Priority: High  . Arthritis  of right ankle 02/11/2014    Priority: Medium  . BPH (benign prostatic hyperplasia) 12/17/2006    Priority: Medium  . Hyperlipidemia 08/19/2006    Priority: Medium  . Essential hypertension 08/19/2006    Priority: Medium  . Allergic rhinitis 02/11/2014    Priority: Low  . Family history of melanoma 02/11/2014    Priority: Low  . GERD (gastroesophageal reflux disease) 02/11/2014    Priority: Low  . Former smoker 02/11/2014    Priority: Low  . INSOMNIA, CHRONIC 07/01/2009    Priority: Low  . Actinic keratosis 12/17/2006    Priority: Low  . History of colonic polyps 08/26/2006    Priority: Low  . Arthritis of left foot 08/27/2016    Medications- reviewed and updated Current Outpatient Medications  Medication Sig Dispense Refill  . amLODipine-benazepril (LOTREL) 5-20 MG capsule TAKE ONE CAPSULE BY MOUTH DAILY 90 capsule 0  . atorvastatin (LIPITOR) 40 MG tablet TAKE ONE TABLET BY MOUTH DAILY 90 tablet 0  . Azelastine HCl 0.15 % SOLN Place 1 spray into the nose as needed.    . finasteride (PROSCAR) 5 MG tablet TAKE ONE TABLET BY MOUTH DAILY 90 tablet 0  . fluticasone (FLONASE) 50 MCG/ACT nasal spray SPRAY TWO SPRAYS IN EACH NOSTRIL ONCE DAILY 16 g 1  . levocetirizine (XYZAL) 5 MG tablet     . meloxicam (MOBIC) 15 MG tablet TAKE ONE TABLET BY MOUTH DAILY 90 tablet 0  . omeprazole (PRILOSEC) 20 MG capsule Take 1 capsule (20 mg total) by mouth daily. 90 capsule 3   No current facility-administered medications for this visit.      Objective:  Ht 5\' 7"  (1.702 m)   Wt 220 lb (99.8 kg)   BMI 34.46 kg/m  self reported vitalsGen: NAD, resting comfortably Lungs: nonlabored, normal respiratory rate  Skin: appears dry, no obvious rash     Assessment and Plan   #hypertension S: controlled on Amlodipine-Benazepril 5-20 mg daily. No missed doses, no side effects. Not checking BP at home.  Adds salt to food at times. Uses low sodium options when available. Avoids packaged foods.  BP  Readings from Last 3 Encounters:  01/02/18 126/70  10/30/17 118/64  09/13/17 110/60  A/P: likely blood pressure remains controlled. We discussed getting a home cuff especially with not knowing how long covid 19 pandemic will last. He will send me a mychart message after gets a cuff and goal is <140/90.   #hyperlipidemia S:  controlled on Atorvastatin 40 mg daily. Denies myalgia or other side effects. No missed doses.  Lab Results  Component Value Date   CHOL 163 01/02/2018   HDL 52.20 01/02/2018   LDLCALC 81 01/02/2018   LDLDIRECT 90.0 02/11/2014   TRIG 150.0 (H) 01/02/2018   CHOLHDL 3 01/02/2018   A/P: Stable. Continue current medications.   # GERD S:Taking Omeprazole 40 mg daily. Tries to avoid spicy food. Stopped drinking carbonated beverages, beer and red wine. Did ok when tried OTC 20mg  dose A/P: Stable. Continue current medications but try to reduce dose to 20mg  and see if effective.  Lab Results  Component Value Date   [id number] 425 01/02/2018  he will try OTC b12  # BPH S:Taking Finasteride 5 mg daily.  Nocturia once a night A/P: Stable. Continue current medications.    # Tinnitus S:Sx x 1 year, worsening over the past 2 months- louder. Sx in both ears. Sx are constant.  Denies hearing loss. 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  A/P: he is going to monitor- if it gets worse would consider ENT referral but right now with covid 19 wants to hold off on an in person visit.     # Obesity  S:up to 30 minutes walking everyday it doesn't rain- may go to 45.  Trying to do salad and protein most nights. Breakfast- bacon and eggs. Tries to do things like splitting a cheeseburger with his wife.  Wt Readings from Last 3 Encounters:  07/04/18 220 lb (99.8 kg)  01/02/18 220 lb 6.4 oz (100 kg)  10/30/17 217 lb 6.4 oz (98.6 kg)  A/P: at least stable through covid 19. Encouraged need for healthy eating, regular exercise to be continued, weight loss. - goal  5 lbs by follow up.   #arthritis of right ankle- taking meloxicam each morning. Uses marijuana in winter to help with pain- he eats it.  -He is aware of risks to kidneys with meloxicam and need for at least 47-month BMP-he agrees to come by for labs that he will call to schedule  6 month CPE advised. Schedule labs within 2 weeks and he will wear mask.  Lab/Order associations:   ICD-10-CM   1. Essential hypertension  Z56 Basic metabolic panel  2. Hyperlipidemia, unspecified hyperlipidemia type  E78.5   3. Gastroesophageal reflux disease without esophagitis  K21.9   4. Benign prostatic hyperplasia with nocturia  N40.1    R35.1   5. Tinnitus of both ears  H93.13   6. Obesity (BMI 30-39.9)  E66.9    Meds ordered this encounter  Medications  . omeprazole (PRILOSEC) 20 MG capsule  Sig: Take 1 capsule (20 mg total) by mouth daily.    Dispense:  90 capsule    Refill:  3   Return precautions advised.  Garret Reddish, MD

## 2018-08-07 ENCOUNTER — Other Ambulatory Visit: Payer: Self-pay

## 2018-08-07 ENCOUNTER — Other Ambulatory Visit (INDEPENDENT_AMBULATORY_CARE_PROVIDER_SITE_OTHER): Payer: Medicare Other

## 2018-08-07 DIAGNOSIS — I10 Essential (primary) hypertension: Secondary | ICD-10-CM

## 2018-08-07 LAB — BASIC METABOLIC PANEL
BUN: 18 mg/dL (ref 6–23)
CO2: 26 mEq/L (ref 19–32)
Calcium: 9.4 mg/dL (ref 8.4–10.5)
Chloride: 104 mEq/L (ref 96–112)
Creatinine, Ser: 0.82 mg/dL (ref 0.40–1.50)
GFR: 93.29 mL/min (ref >60.00)
Glucose, Bld: 111 mg/dL — ABNORMAL HIGH (ref 70–99)
Potassium: 3.9 mEq/L (ref 3.5–5.1)
Sodium: 139 mEq/L (ref 135–145)

## 2018-08-19 ENCOUNTER — Encounter: Payer: Self-pay | Admitting: Family Medicine

## 2018-08-29 ENCOUNTER — Telehealth: Payer: Self-pay | Admitting: Family Medicine

## 2018-08-29 NOTE — Telephone Encounter (Signed)
I left a message asking the patient to call me at [phone] to schedule AWV with Courtney. VDM (Dee-Dee) °

## 2018-10-14 ENCOUNTER — Other Ambulatory Visit: Payer: Self-pay

## 2018-10-14 ENCOUNTER — Ambulatory Visit (INDEPENDENT_AMBULATORY_CARE_PROVIDER_SITE_OTHER): Payer: Medicare Other

## 2018-10-14 DIAGNOSIS — Z23 Encounter for immunization: Secondary | ICD-10-CM | POA: Diagnosis not present

## 2018-10-29 ENCOUNTER — Telehealth: Payer: Self-pay | Admitting: Family Medicine

## 2018-10-29 NOTE — Telephone Encounter (Signed)
Scheduled for 10/31/2018 at 10:00am.

## 2018-10-29 NOTE — Telephone Encounter (Signed)
I called the patient to schedule AWV with [name], but there was no answer and no option to leave a message. If patient calls back, please schedule Medicare Wellness Visit at next available opening.  Matthew Watson (Dee-Dee)

## 2018-10-31 ENCOUNTER — Other Ambulatory Visit: Payer: Self-pay

## 2018-10-31 ENCOUNTER — Ambulatory Visit (INDEPENDENT_AMBULATORY_CARE_PROVIDER_SITE_OTHER): Payer: Medicare Other

## 2018-10-31 VITALS — BP 122/66 | Temp 98.2°F | Ht 67.0 in | Wt 197.4 lb

## 2018-10-31 DIAGNOSIS — Z0000 Encounter for general adult medical examination without abnormal findings: Secondary | ICD-10-CM | POA: Diagnosis not present

## 2018-10-31 NOTE — Patient Instructions (Addendum)
Matthew Watson , Thank you for taking time to come for your Medicare Wellness Visit. I appreciate your ongoing commitment to your health goals. Please review the following plan we discussed and let me know if I can assist you in the future.   Screening recommendations/referrals: Colorectal Screening: completed 05/18/16 with Matthew Watson   Vision and Dental Exams: Recommended annual ophthalmology exams for early detection of glaucoma and other disorders of the eye Recommended annual dental exams for proper oral hygiene  Vaccinations: Influenza vaccine: completed 10/13/17 Pneumococcal vaccine: up to date; last 02/16/16 Tdap vaccine: up to date; last 02/16/16  Shingles vaccine: Shingrix completed   Advanced directives: Please bring a copy of your POA (Power of [place]) and/or Living Will to your next appointment.  Goals: Recommend to drink at least 6-8 8oz glasses of water per day.  Next appointment: Please schedule your Annual Wellness Visit with your Nurse Health Advisor in one year.  Return to see Matthew Watson before the end of the year  Preventive Care 23 Years and Older, Male Preventive care refers to lifestyle choices and visits with your health care provider that can promote health and wellness. What does preventive care include?  A yearly physical exam. This is also called an annual well check.  Dental exams once or twice a year.  Routine eye exams. Ask your health care provider how often you should have your eyes checked.  Personal lifestyle choices, including:  Daily care of your teeth and gums.  Regular physical activity.  Eating a healthy diet.  Avoiding tobacco and drug use.  Limiting alcohol use.  Practicing safe sex.  Taking low doses of aspirin every day if recommended by your health care provider..  Taking vitamin and mineral supplements as recommended by your health care provider. What happens during an annual well check? The services and screenings done by your  health care provider during your annual well check will depend on your age, overall health, lifestyle risk factors, and family history of disease. Counseling  Your health care provider may ask you questions about your:  Alcohol use.  Tobacco use.  Drug use.  Emotional well-being.  Home and relationship well-being.  Sexual activity.  Eating habits.  History of falls.  Memory and ability to understand (cognition).  Work and work Statistician. Screening  You may have the following tests or measurements:  Height, weight, and BMI.  Blood pressure.  Lipid and cholesterol levels. These may be checked every 5 years, or more frequently if you are over 91 years old.  Skin check.  Lung cancer screening. You may have this screening every year starting at age 36 if you have a 30-pack-year history of smoking and currently smoke or have quit within the past 15 years.  Fecal occult blood test (FOBT) of the stool. You may have this test every year starting at age 50.  Flexible sigmoidoscopy or colonoscopy. You may have a sigmoidoscopy every 5 years or a colonoscopy every 10 years starting at age 65.  Prostate cancer screening. Recommendations will vary depending on your family history and other risks.  Hepatitis C blood test.  Hepatitis B blood test.  Sexually transmitted disease (STD) testing.  Diabetes screening. This is done by checking your blood sugar (glucose) after you have not eaten for a while (fasting). You may have this done every 1-3 years.  Abdominal aortic aneurysm (AAA) screening. You may need this if you are a current or former smoker.  Osteoporosis. You may be screened starting  at age 53 if you are at high risk. Talk with your health care provider about your test results, treatment options, and if necessary, the need for more tests. Vaccines  Your health care provider may recommend certain vaccines, such as:  Influenza vaccine. This is recommended every year.   Tetanus, diphtheria, and acellular pertussis (Tdap, Td) vaccine. You may need a Td booster every 10 years.  Zoster vaccine. You may need this after age 66.  Pneumococcal 13-valent conjugate (PCV13) vaccine. One dose is recommended after age 90.  Pneumococcal polysaccharide (PPSV23) vaccine. One dose is recommended after age 65. Talk to your health care provider about which screenings and vaccines you need and how often you need them. This information is not intended to replace advice given to you by your health care provider. Make sure you discuss any questions you have with your health care provider. Document Released: 01/28/2015 Document Revised: 09/21/2015 Document Reviewed: 11/02/2014 Elsevier Interactive Patient Education  2017 [place] Prevention in the Home Falls can cause injuries. They can happen to people of all ages. There are many things you can do to make your home safe and to help prevent falls. What can I do on the outside of my home?  Regularly fix the edges of walkways and driveways and fix any cracks.  Remove anything that might make you trip as you walk through a door, such as a raised step or threshold.  Trim any bushes or trees on the path to your home.  Use bright outdoor lighting.  Clear any walking paths of anything that might make someone trip, such as rocks or tools.  Regularly check to see if handrails are loose or broken. Make sure that both sides of any steps have handrails.  Any raised decks and porches should have guardrails on the edges.  Have any leaves, snow, or ice cleared regularly.  Use sand or salt on walking paths during winter.  Clean up any spills in your garage right away. This includes oil or grease spills. What can I do in the bathroom?  Use night lights.  Install grab bars by the toilet and in the tub and shower. Do not use towel bars as grab bars.  Use non-skid mats or decals in the tub or shower.  If you need to  sit down in the shower, use a plastic, non-slip stool.  Keep the floor dry. Clean up any water that spills on the floor as soon as it happens.  Remove soap buildup in the tub or shower regularly.  Attach bath mats securely with double-sided non-slip rug tape.  Do not have throw rugs and other things on the floor that can make you trip. What can I do in the bedroom?  Use night lights.  Make sure that you have a light by your bed that is easy to reach.  Do not use any sheets or blankets that are too big for your bed. They should not hang down onto the floor.  Have a firm chair that has side arms. You can use this for support while you get dressed.  Do not have throw rugs and other things on the floor that can make you trip. What can I do in the kitchen?  Clean up any spills right away.  Avoid walking on wet floors.  Keep items that you use a lot in easy-to-reach places.  If you need to reach something above you, use a strong step stool that has a grab bar.  Keep electrical cords out of the way.  Do not use floor polish or wax that makes floors slippery. If you must use wax, use non-skid floor wax.  Do not have throw rugs and other things on the floor that can make you trip. What can I do with my stairs?  Do not leave any items on the stairs.  Make sure that there are handrails on both sides of the stairs and use them. Fix handrails that are broken or loose. Make sure that handrails are as long as the stairways.  Check any carpeting to make sure that it is firmly attached to the stairs. Fix any carpet that is loose or worn.  Avoid having throw rugs at the top or bottom of the stairs. If you do have throw rugs, attach them to the floor with carpet tape.  Make sure that you have a light switch at the top of the stairs and the bottom of the stairs. If you do not have them, ask someone to add them for you. What else can I do to help prevent falls?  Wear shoes that:  Do not  have high heels.  Have rubber bottoms.  Are comfortable and fit you well.  Are closed at the toe. Do not wear sandals.  If you use a stepladder:  Make sure that it is fully opened. Do not climb a closed stepladder.  Make sure that both sides of the stepladder are locked into place.  Ask someone to hold it for you, if possible.  Clearly mark and make sure that you can see:  Any grab bars or handrails.  First and last steps.  Where the edge of each step is.  Use tools that help you move around (mobility aids) if they are needed. These include:  Canes.  Walkers.  Scooters.  Crutches.  Turn on the lights when you go into a dark area. Replace any light bulbs as soon as they burn out.  Set up your furniture so you have a clear path. Avoid moving your furniture around.  If any of your floors are uneven, fix them.  If there are any pets around you, be aware of where they are.  Review your medicines with your doctor. Some medicines can make you feel dizzy. This can increase your chance of falling. Ask your doctor what other things that you can do to help prevent falls. This information is not intended to replace advice given to you by your health care provider. Make sure you discuss any questions you have with your health care provider. Document Released: 10/28/2008 Document Revised: 06/09/2015 Document Reviewed: 02/05/2014 Elsevier Interactive Patient Education  2017 [employer].

## 2018-10-31 NOTE — Progress Notes (Signed)
Subjective:   Matthew Watson is a 68 y.o. male who presents for Medicare Annual/Subsequent preventive examination.  Review of Systems:   Cardiac Risk Factors include: advanced age (>11men, >21 women);male gender;hypertension;dyslipidemia     Objective:    Vitals: BP 122/66 (BP Location: Left Arm, Patient Position: Sitting, Cuff Size: Normal)   Temp 98.2 F (36.8 C) (Temporal)   Ht 5\' 7"  (1.702 m)   Wt 197 lb 6.4 oz (89.5 kg)   BMI 30.92 kg/m   Body mass index is 30.92 kg/m.  Advanced Directives 10/31/2018 01/03/2017 05/18/2016  Does Patient Have a Medical Advance Directive? Yes Yes Yes  Type of Advance Directive Living will;Healthcare Power of Attorney - -  Does patient want to make changes to medical advance directive? No - Patient declined - -  Copy of [place] in Chart? No - copy requested - -    Tobacco Social History   Tobacco Use  Smoking Status Former Smoker  . Packs/day: 0.25  . Years: 10.00  . Pack years: 2.50  . Types: Cigarettes  . Quit date: 03/23/1990  . Years since quitting: 28.6  Smokeless Tobacco Never Used  Tobacco Comment   quit in 1992     Counseling given: Not Answered Comment: quit in 1992   Clinical Intake:  Pre-visit preparation completed: Yes  Pain : No/denies pain  Diabetes: No  How often do you need to have someone help you when you read instructions, pamphlets, or other written materials from your doctor or pharmacy?: 1 - Never  Interpreter Needed?: No  Information entered by :: Denman George LPN  Past Medical History:  Diagnosis Date  . Adenomatous polyp of colon   . Allergy    seasonal  . Arthritis   . BPH (benign prostatic hyperplasia)   . Diverticulosis 09/2010   episode of diverticulitis  . GERD (gastroesophageal reflux disease)   . Hyperlipidemia   . Hypertension   . Low back pain    Past Surgical History:  Procedure Laterality Date  . ANKLE FRACTURE SURGERY     chronic pain as result   . COLONOSCOPY    . HERNIA REPAIR     jan 2012, [facility]   Family History  Problem Relation Age of Onset  . Cancer Father        melanoma, cousin also died of it  . Cancer Brother        melanoma  . Colon polyps Sister   . Cancer Paternal Uncle        Colon cancer  . Colon cancer Paternal Uncle    Social History   Socioeconomic History  . Marital status: Married    Spouse name: Not on file  . Number of children: Not on file  . Years of education: Not on file  . Highest education level: Not on file  Occupational History  . Not on file  Social Needs  . Financial resource strain: Not on file  . Food insecurity    Worry: Not on file    Inability: Not on file  . Transportation needs    Medical: Not on file    Non-medical: Not on file  Tobacco Use  . Smoking status: Former Smoker    Packs/day: 0.25    Years: 10.00    Pack years: 2.50    Types: Cigarettes    Quit date: 03/23/1990    Years since quitting: 28.6  . Smokeless tobacco: Never Used  . Tobacco comment:  quit in 1992  Substance and Sexual Activity  . Alcohol use: Yes    Alcohol/week: 6.0 standard drinks    Types: 6 Cans of beer per week    Comment: 2 beers a day  . Drug use: No  . Sexual activity: Yes  Lifestyle  . Physical activity    Days per week: Not on file    Minutes per session: Not on file  . Stress: Not on file  Relationships  . Social Herbalist on phone: Not on file    Gets together: Not on file    Attends religious service: Not on file    Active member of club or organization: Not on file    Attends meetings of clubs or organizations: Not on file    Relationship status: Not on file  Other Topics Concern  . Not on file  Social History Narrative   Married >30 years in 2016. No children. 2 dogs.       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  amLODipine-benazepril (LOTREL) 5-20 MG capsule Take 1 capsule by mouth daily.  Marland Kitchen atorvastatin (LIPITOR) 40 MG tablet Take 1 tablet (40 mg total) by mouth daily.  . Azelastine HCl 0.15 % SOLN Place 1 spray into the nose as needed.  . finasteride (PROSCAR) 5 MG tablet Take 1 tablet (5 mg total) by mouth daily.  . fluticasone (FLONASE) 50 MCG/ACT nasal spray SPRAY TWO SPRAYS IN EACH NOSTRIL ONCE DAILY  . levocetirizine (XYZAL) 5 MG tablet   . meloxicam (MOBIC) 15 MG tablet Take 1 tablet (15 mg total) by mouth daily.  Marland Kitchen omeprazole (PRILOSEC) 20 MG capsule Take 1 capsule (20 mg total) by mouth daily.   No facility-administered encounter medications on file as of 10/31/2018.     Activities of Daily Living In your present state of health, do you have any difficulty performing the following activities: 10/31/2018  Hearing? N  Vision? N  Difficulty concentrating or making decisions? N  Walking or climbing stairs? N  Dressing or bathing? N  Doing errands, shopping? N  Preparing Food and eating ? N  Using the Toilet? N  In the past six months, have you accidently leaked urine? N  Do you have problems with loss of bowel control? N  Managing your Medications? N  Managing your Finances? N  Housekeeping or managing your Housekeeping? N  Some recent data might be hidden    Patient Care Team: Marin Olp, MD as PCP - General (Family Medicine) Wylene Simmer, MD as Consulting Physician (Orthopedic Surgery) Harold Hedge, Darrick Grinder, MD as Consulting Physician (Allergy and Immunology) Dermatology, [facility] as Consulting Physician Jerline Pain Mingo Amber, DO as Consulting Physician (Ophthalmology)   Assessment:   This is a routine wellness examination for Matthew Watson.  Exercise Activities and Dietary recommendations Current Exercise Habits: Home exercise routine, Type of exercise: Other - see comments(yardwork/ outside activities), Time (Minutes): 30, Frequency (Times/Week): 3, Weekly Exercise  (Minutes/Week): 90, Intensity: Mild  Goals    . Exercise 150 min/wk Moderate Activity     Stretching in the am  Riding the bike in the gym; wife will retire in 30 days and you both can go to the gym       Fall Risk Fall Risk  10/31/2018 07/04/2018 01/02/2018 01/03/2017 02/23/2016  Falls in the past year? 1 0  1 Yes Yes  Number falls in past yr: - - 1 1 1   Comment - - - [place] -  Injury with Fall? 0 - 0 No Yes  Risk for fall due to : - - Impaired balance/gait - -  Follow up Education provided;Falls prevention discussed;Falls evaluation completed - Falls evaluation completed Education provided -   Is the patient's home free of loose throw rugs in walkways, pet beds, electrical cords, etc?   yes      Grab bars in the bathroom? yes      Handrails on the stairs?   yes      Adequate lighting?   yes  Timed Get Up and Go Performed: completed and within normal timeframe; no gait abnormalities noted   Depression Screen PHQ 2/9 Scores 10/31/2018 07/04/2018 01/02/2018 01/03/2017  PHQ - 2 Score 0 0 0 0    Cognitive Function-no cognitive concerns at this time  MMSE - Mini Mental State Exam 01/03/2017  Not completed: (No Data)     6CIT Screen 10/31/2018  What Year? 0 points  What month? 0 points  What time? 0 points  Count back from 20 0 points  Months in reverse 0 points  Repeat phrase 0 points  Total Score 0    Immunization History  Administered Date(s) Administered  . Fluad Quad(high Dose 65+) 10/14/2018  . Influenza Split 11/21/2011  . Influenza Whole 11/18/2008, 01/11/2010  . Influenza, High Dose Seasonal PF 12/20/2015, 11/19/2016, 11/19/2017  . Influenza,inj,Quad PF,6+ Mos 11/11/2012, 11/18/2013, 12/07/2014  . Pneumococcal Conjugate-13 02/15/2015  . Pneumococcal Polysaccharide-23 02/16/2016  . Td 01/15/2005  . Tdap 10/10/2016  . Zoster 07/14/2012  . Zoster Recombinat (Shingrix) 05/21/2017, 08/13/2017    Qualifies for Shingles Vaccine?  Shingrix completed   Screening Tests Health Maintenance  Topic Date Due  . COLONOSCOPY  05/19/2026  . TETANUS/TDAP  10/11/2026  . INFLUENZA VACCINE  Completed  . PNA vac Low Risk Adult  Completed   Cancer Screenings: Lung: Low Dose CT Chest recommended if Age 17-80 years, 30 pack-year currently smoking OR have quit w/in 15years. Patient does not qualify. Colorectal: colonoscopy 05/18/16 with Dr. Ardis Hughs   Plan:  I have personally reviewed and addressed the Medicare Annual Wellness questionnaire and have noted the following in the patient's chart:  A. Medical and social history B. Use of alcohol, tobacco or illicit drugs  C. Current medications and supplements D. Functional ability and status E.  Nutritional status F.  Physical activity G. Advance directives H. List of other physicians I.  Hospitalizations, surgeries, and ER visits in previous 12 months J.  [place] such as hearing and vision if needed, cognitive and depression L. Referrals, records requested, and appointments- none   In addition, I have reviewed and discussed with patient certain preventive protocols, quality metrics, and best practice recommendations. A written personalized care plan for preventive services as well as general preventive health recommendations were provided to patient.   Signed,  Denman George, LPN  Nurse Health Advisor   Nurse Notes: no additional

## 2018-10-31 NOTE — Progress Notes (Signed)
I have personally reviewed the Medicare Annual Wellness Visit and agree with the assessment and plan.  Algis Greenhouse. Jerline Pain, MD 10/31/2018 11:30 AM

## 2018-12-07 ENCOUNTER — Encounter: Payer: Self-pay | Admitting: Family Medicine

## 2019-01-02 ENCOUNTER — Other Ambulatory Visit: Payer: Self-pay

## 2019-01-05 ENCOUNTER — Encounter: Payer: Self-pay | Admitting: Family Medicine

## 2019-01-05 ENCOUNTER — Other Ambulatory Visit: Payer: Self-pay

## 2019-01-05 ENCOUNTER — Ambulatory Visit (INDEPENDENT_AMBULATORY_CARE_PROVIDER_SITE_OTHER): Payer: Medicare Other | Admitting: Family Medicine

## 2019-01-05 VITALS — BP 130/68 | HR 66 | Temp 98.4°F | Ht 67.0 in | Wt 196.8 lb

## 2019-01-05 DIAGNOSIS — Z833 Family history of diabetes mellitus: Secondary | ICD-10-CM

## 2019-01-05 DIAGNOSIS — F1190 Opioid use, unspecified, uncomplicated: Secondary | ICD-10-CM | POA: Diagnosis not present

## 2019-01-05 DIAGNOSIS — E785 Hyperlipidemia, unspecified: Secondary | ICD-10-CM

## 2019-01-05 DIAGNOSIS — Z0000 Encounter for general adult medical examination without abnormal findings: Secondary | ICD-10-CM | POA: Diagnosis not present

## 2019-01-05 DIAGNOSIS — R739 Hyperglycemia, unspecified: Secondary | ICD-10-CM | POA: Diagnosis not present

## 2019-01-05 DIAGNOSIS — Z86010 Personal history of colonic polyps: Secondary | ICD-10-CM

## 2019-01-05 DIAGNOSIS — Z87891 Personal history of nicotine dependence: Secondary | ICD-10-CM

## 2019-01-05 DIAGNOSIS — I10 Essential (primary) hypertension: Secondary | ICD-10-CM | POA: Diagnosis not present

## 2019-01-05 DIAGNOSIS — Z125 Encounter for screening for malignant neoplasm of prostate: Secondary | ICD-10-CM | POA: Diagnosis not present

## 2019-01-05 DIAGNOSIS — N401 Enlarged prostate with lower urinary tract symptoms: Secondary | ICD-10-CM

## 2019-01-05 DIAGNOSIS — R351 Nocturia: Secondary | ICD-10-CM

## 2019-01-05 DIAGNOSIS — M19071 Primary osteoarthritis, right ankle and foot: Secondary | ICD-10-CM

## 2019-01-05 LAB — COMPREHENSIVE METABOLIC PANEL
ALT: 17 U/L (ref 0–53)
AST: 16 U/L (ref 0–37)
Albumin: 4.3 g/dL (ref 3.5–5.2)
Alkaline Phosphatase: 104 U/L (ref 39–117)
BUN: 21 mg/dL (ref 6–23)
CO2: 25 mEq/L (ref 19–32)
Calcium: 9.3 mg/dL (ref 8.4–10.5)
Chloride: 106 mEq/L (ref 96–112)
Creatinine, Ser: 0.8 mg/dL (ref 0.40–1.50)
GFR: 95.87 mL/min (ref >60.00)
Glucose, Bld: 90 mg/dL (ref 70–99)
Potassium: 3.8 mEq/L (ref 3.5–5.1)
Sodium: 141 mEq/L (ref 135–145)
Total Bilirubin: 0.6 mg/dL (ref 0.2–1.2)
Total Protein: 6.6 g/dL (ref 6.0–8.3)

## 2019-01-05 LAB — CBC WITH DIFFERENTIAL/PLATELET
Basophils Absolute: 0.1 10*3/uL (ref 0.0–0.1)
Basophils Relative: 0.8 % (ref 0.0–3.0)
Eosinophils Absolute: 0.2 10*3/uL (ref 0.0–0.7)
Eosinophils Relative: 1.6 % (ref 0.0–5.0)
HCT: 42.6 % (ref 39.0–52.0)
Hemoglobin: 14.1 g/dL (ref 13.0–17.0)
Lymphocytes Relative: 18.4 % (ref 12.0–46.0)
Lymphs Abs: 1.8 10*3/uL (ref 0.7–4.0)
MCHC: 33.1 g/dL (ref 30.0–36.0)
MCV: 87.6 fl (ref 78.0–100.0)
Monocytes Absolute: 0.7 10*3/uL (ref 0.1–1.0)
Monocytes Relative: 7.1 % (ref 3.0–12.0)
Neutro Abs: 7.1 10*3/uL (ref 1.4–7.7)
Neutrophils Relative %: 72.1 % (ref 43.0–77.0)
Platelets: 240 10*3/uL (ref 150.0–400.0)
RBC: 4.87 Mil/uL (ref 4.22–5.81)
RDW: 13.6 % (ref 11.5–15.5)
WBC: 9.8 10*3/uL (ref 4.0–10.5)

## 2019-01-05 LAB — LIPID PANEL
Cholesterol: 126 mg/dL (ref 0–200)
HDL: 45.3 mg/dL (ref >39.00)
LDL Cholesterol: 56 mg/dL (ref 0–99)
NonHDL: 80.79
Total CHOL/HDL Ratio: 3
Triglycerides: 122 mg/dL (ref 0.0–149.0)
VLDL: 24.4 mg/dL (ref 0.0–40.0)

## 2019-01-05 LAB — PSA: PSA: 0.18 ng/mL (ref 0.10–4.00)

## 2019-01-05 LAB — HEMOGLOBIN A1C: Hgb A1c MFr Bld: 5.6 % (ref 4.6–6.5)

## 2019-01-05 NOTE — Patient Instructions (Addendum)
Please stop by lab before you go If you do not have mychart- we will call you about results within 5 business days of [place] receiving them.  If you have mychart- we will send your results within 3 business days of [place] receiving them.  If abnormal or we want to clarify a result, we will call or mychart you to make sure you receive the message.  If you have questions or concerns or don't hear within 5-7 days, please send [place] a message or call us.   For ears- offered referral to ENT- he wants to get covid vaccine first then will send me mychart mesage    Recommended follow up: Return in about 6 months (around 07/06/2019) for follow up- or sooner if needed.

## 2019-01-05 NOTE — Progress Notes (Signed)
Phone: [phone]   Subjective:  Patient presents today for their annual physical. Chief complaint-noted.   See problem oriented charting- ROS- full  review of systems was completed and negative  except for: dental problems- cavities, leg swelling, joint pain, anxiety, allergies  The following were reviewed and entered/updated in epic: Past Medical History:  Diagnosis Date  . Adenomatous polyp of colon   . Allergy    seasonal  . Arthritis   . BPH (benign prostatic hyperplasia)   . Diverticulosis 09/2010   episode of diverticulitis  . GERD (gastroesophageal reflux disease)   . Hyperlipidemia   . Hypertension   . Low back pain    Patient Active Problem List   Diagnosis Date Noted  . Chronic narcotic use 10/10/2016    Priority: High  . Arthritis of right ankle 02/11/2014    Priority: Medium  . BPH (benign prostatic hyperplasia) 12/17/2006    Priority: Medium  . Hyperlipidemia 08/19/2006    Priority: Medium  . Essential hypertension 08/19/2006    Priority: Medium  . Allergic rhinitis 02/11/2014    Priority: Low  . Family history of melanoma 02/11/2014    Priority: Low  . GERD (gastroesophageal reflux disease) 02/11/2014    Priority: Low  . Former smoker 02/11/2014    Priority: Low  . INSOMNIA, CHRONIC 07/01/2009    Priority: Low  . Actinic keratosis 12/17/2006    Priority: Low  . History of colonic polyps 08/26/2006    Priority: Low  . Arthritis of left foot 08/27/2016   Past Surgical History:  Procedure Laterality Date  . ANKLE FRACTURE SURGERY     chronic pain as result  . COLONOSCOPY    . HERNIA REPAIR     jan 2012, [place]    Family History  Problem Relation Age of Onset  . Cancer Father        melanoma, cousin also died of it  . Cancer Brother        melanoma  . Colon polyps Sister   . Cancer Paternal Uncle        Colon cancer  . Colon cancer Paternal Uncle     Medications- reviewed and updated Current Outpatient Medications  Medication  Sig Dispense Refill  . amLODipine-benazepril (LOTREL) 5-20 MG capsule Take 1 capsule by mouth daily. 90 capsule 3  . atorvastatin (LIPITOR) 40 MG tablet Take 1 tablet (40 mg total) by mouth daily. 90 tablet 3  . Azelastine HCl 0.15 % SOLN Place 1 spray into the nose as needed.    . finasteride (PROSCAR) 5 MG tablet Take 1 tablet (5 mg total) by mouth daily. 90 tablet 3  . fluticasone (FLONASE) 50 MCG/ACT nasal spray SPRAY TWO SPRAYS IN EACH NOSTRIL ONCE DAILY 48 g 1  . levocetirizine (XYZAL) 5 MG tablet     . meloxicam (MOBIC) 15 MG tablet Take 1 tablet (15 mg total) by mouth daily. 90 tablet 1  . omeprazole (PRILOSEC) 20 MG capsule Take 1 capsule (20 mg total) by mouth daily. 90 capsule 3   No current facility-administered medications for this visit.    Allergies-reviewed and updated No Known Allergies  Social History   Social History Narrative   Married >30 years in 2016. No children. 2 dogs.       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 TM normal. Bridge of nose normal. Eyelids normal.  Neck: no thyromegaly or cervical lymphadenopathy  CV: RRR no murmurs rubs or gallops Lungs: CTAB no crackles, wheeze, rhonchi Abdomen: soft/nontender/nondistended/normal bowel sounds. No rebound or guarding.  [id number] edema Skin: warm, dry Neuro: grossly normal, moves all extremities, PERRLA    Assessment and Plan  68 y.o. male presenting for annual physical.  Health Maintenance counseling: 1. Anticipatory guidance: Patient counseled regarding regular dental exams yes q6 months, eye exams yes twice yearly,  avoiding smoking and second hand smoke , limiting alcohol to 2 beverages per day- does not drink at all 2. Risk  factor reduction:  Advised patient of need for regular exercise and diet rich and fruits and vegetables to reduce risk of heart attack and stroke. Exercise-pt walks 5 days . Diet- pt cooks at home mostly and gets something to go every now and then. Down 24 lbs from June- congratulated on effort. He has cut out cokes, beers, white breads etc.  Wt Readings from Last 3 Encounters:  01/05/19 196 lb 12.8 oz (89.3 kg)  10/31/18 197 lb 6.4 oz (89.5 kg)  07/04/18 220 lb (99.8 kg)  3. Immunizations/screenings/ancillary studies- up to date. Interested in covid vaccine.  Immunization History  Administered Date(s) Administered  . Fluad Quad(high Dose 65+) 10/14/2018  . Influenza Split 11/21/2011  . Influenza Whole 11/18/2008, 01/11/2010  . Influenza, High Dose Seasonal PF 12/20/2015, 11/19/2016, 11/19/2017  . Influenza,inj,Quad PF,6+ Mos 11/11/2012, 11/18/2013, 12/07/2014  . Pneumococcal Conjugate-13 02/15/2015  . Pneumococcal Polysaccharide-23 02/16/2016  . Td 01/15/2005  . Tdap 10/10/2016  . Zoster 07/14/2012  . Zoster Recombinat (Shingrix) 05/21/2017, 08/13/2017  4. Prostate cancer screening- low risk PSA trend on finasteride  Lab Results  Component Value Date   PSA 0.19 01/02/2018   PSA 0.20 10/10/2016   PSA 0.17 08/09/2015   5. Colon cancer screening - yes History of colonic polyps but was moved back to 10 years follow up as last colonoscopy looked good. Due 05/2026 with last 05/2016 6. Skin cancer screening- plans to schedule dermatology visit. advised regular sunscreen use. Denies worrisome, changing, or new skin lesions.  3. Former smoker-September 2019 aneurysm screening negative.  Quit in 1990s  Status of chronic or acute concerns  -Pt c/o sensation fluid in bilateral ear for at least a few months. Has had issues in past where comes and goes but worse over last month. Sometimes doesn't notice, sometimes can have it at night or in AM- right worse than left. Also notes some ringing in the  ears- worse when pressure seems higher. On exam - no obvious abnormalities - offered referral to ENT- he wants to get covid vaccine first then will send me mychart mesage   Chronic narcotic use in the past- failed drug screen 01/02/2018. Uses marijuana before he goes to bed- this is why he failed.  Arthritis of right ankle-.  He takes meloxicam on a regular basis.  We will check kidney function today.    Grandfather with diabetes. His last sugar was 111 but not fasting- will check a1c to be on safe side as not fasting today  Hyperlipidemia-compliant with atorvastatin 40 mg.  Update lipid panel today Lab Results  Component Value Date   CHOL 163 01/02/2018   HDL 52.20 01/02/2018  [place] 81 01/02/2018   LDLDIRECT 90.0 02/11/2014   TRIG 150.0 (H) 01/02/2018   CHOLHDL 3 01/02/2018    Essential hypertension-compliant with amlodipine benazepril 5-20 mg.  BP Readings from Last 3 Encounters:  01/05/19 130/68  10/31/18 122/66  01/02/18 126/70  Well-controlled today-continue current medication  Benign prostatic hyperplasia with nocturia-patient compliant with finasteride   Recommended follow up: Return in about 6 months (around 07/06/2019) for follow up- or sooner if needed.  Lab/Order associations:not  fasting   ICD-10-CM   1. Preventative health care  Z00.00   2. Chronic narcotic use  F11.90   3. Hyperlipidemia, unspecified hyperlipidemia type  E78.5   4. Essential hypertension  I10   5. Benign prostatic hyperplasia with nocturia  N40.1    R35.1   6. Arthritis of right ankle  M19.071   7. History of colonic polyps  Z86.010   8. Former smoker  Z87.891     No orders of the defined types were placed in this encounter.   Return precautions advised.  Garret Reddish, MD

## 2019-02-05 ENCOUNTER — Other Ambulatory Visit: Payer: Self-pay | Admitting: Family Medicine

## 2019-02-11 ENCOUNTER — Ambulatory Visit: Payer: Medicare Other

## 2019-02-20 ENCOUNTER — Ambulatory Visit: Payer: Medicare PPO | Attending: Internal Medicine

## 2019-02-20 DIAGNOSIS — Z23 Encounter for immunization: Secondary | ICD-10-CM | POA: Insufficient documentation

## 2019-02-20 NOTE — Progress Notes (Signed)
   Covid-19 Vaccination Clinic  Name:  Matthew Watson    MRN: [id number] DOB: Mar 28, 1950  02/20/2019  [name] was observed post Covid-19 immunization for 15 minutes without incidence. He was provided with Vaccine Information Sheet and instruction to access the V-Safe system.   [name] was instructed to call 911 with any severe reactions post vaccine: Marland Kitchen Difficulty breathing  . Swelling of your face and throat  . A fast heartbeat  . A bad rash all over your body  . Dizziness and weakness    Immunizations Administered    Name Date Dose VIS Date Route   Pfizer COVID-19 Vaccine 02/20/2019  9:22 AM 0.3 mL 12/26/2018 Intramuscular   Manufacturer: [place]   Lot: [id number]   [place]: [id number]

## 2019-02-28 ENCOUNTER — Ambulatory Visit: Payer: Medicare Other

## 2019-03-17 ENCOUNTER — Ambulatory Visit: Payer: Medicare PPO | Attending: Internal Medicine

## 2019-03-17 DIAGNOSIS — Z23 Encounter for immunization: Secondary | ICD-10-CM | POA: Insufficient documentation

## 2019-03-17 NOTE — Progress Notes (Signed)
   Covid-19 Vaccination Clinic  Name:  Matthew Watson    MRN: [id number] DOB: 02-09-1950  03/17/2019  [name] was observed post Covid-19 immunization for 15 minutes without incident. He was provided with Vaccine Information Sheet and instruction to access the V-Safe system.   [name] was instructed to call 911 with any severe reactions post vaccine: Marland Kitchen Difficulty breathing  . Swelling of face and throat  . A fast heartbeat  . A bad rash all over body  . Dizziness and weakness   Immunizations Administered    Name Date Dose VIS Date Route   Pfizer COVID-19 Vaccine 03/17/2019 10:21 AM 0.3 mL 12/26/2018 Intramuscular   Manufacturer: [place]   Lot: [id number]   [place]: [id number]

## 2019-04-02 DIAGNOSIS — L814 Other melanin hyperpigmentation: Secondary | ICD-10-CM | POA: Diagnosis not present

## 2019-04-02 DIAGNOSIS — L820 Inflamed seborrheic keratosis: Secondary | ICD-10-CM | POA: Diagnosis not present

## 2019-04-02 DIAGNOSIS — D485 Neoplasm of uncertain behavior of skin: Secondary | ICD-10-CM | POA: Diagnosis not present

## 2019-04-02 DIAGNOSIS — R208 Other disturbances of skin sensation: Secondary | ICD-10-CM | POA: Diagnosis not present

## 2019-04-02 DIAGNOSIS — L821 Other seborrheic keratosis: Secondary | ICD-10-CM | POA: Diagnosis not present

## 2019-04-02 DIAGNOSIS — D225 Melanocytic nevi of trunk: Secondary | ICD-10-CM | POA: Diagnosis not present

## 2019-04-02 DIAGNOSIS — D1801 Hemangioma of skin and subcutaneous tissue: Secondary | ICD-10-CM | POA: Diagnosis not present

## 2019-04-02 DIAGNOSIS — L570 Actinic keratosis: Secondary | ICD-10-CM | POA: Diagnosis not present

## 2019-04-02 DIAGNOSIS — L989 Disorder of the skin and subcutaneous tissue, unspecified: Secondary | ICD-10-CM | POA: Diagnosis not present

## 2019-04-24 DIAGNOSIS — H40023 Open angle with borderline findings, high risk, bilateral: Secondary | ICD-10-CM | POA: Diagnosis not present

## 2019-04-24 DIAGNOSIS — H52223 Regular astigmatism, bilateral: Secondary | ICD-10-CM | POA: Diagnosis not present

## 2019-04-24 DIAGNOSIS — H2513 Age-related nuclear cataract, bilateral: Secondary | ICD-10-CM | POA: Diagnosis not present

## 2019-04-24 DIAGNOSIS — H5203 Hypermetropia, bilateral: Secondary | ICD-10-CM | POA: Diagnosis not present

## 2019-06-11 ENCOUNTER — Other Ambulatory Visit: Payer: Self-pay | Admitting: Family Medicine

## 2019-06-20 ENCOUNTER — Encounter: Payer: Self-pay | Admitting: Family Medicine

## 2019-06-22 ENCOUNTER — Other Ambulatory Visit: Payer: Self-pay

## 2019-06-22 DIAGNOSIS — K219 Gastro-esophageal reflux disease without esophagitis: Secondary | ICD-10-CM

## 2019-06-28 ENCOUNTER — Encounter: Payer: Self-pay | Admitting: Family Medicine

## 2019-06-29 ENCOUNTER — Encounter: Payer: Self-pay | Admitting: Family Medicine

## 2019-06-29 NOTE — Telephone Encounter (Signed)
See open message for documentation.

## 2019-07-13 NOTE — Progress Notes (Signed)
Phone [phone] In person visit   Subjective:   Matthew Watson is a 69 y.o. year old very pleasant male patient who presents for/with See problem oriented charting Chief Complaint  Patient presents with  . Follow-up  . Hypertension  . Hyperlipidemia   This visit occurred during the SARS-CoV-2 public health emergency.  Safety protocols were in place, including screening questions prior to the visit, additional usage of staff PPE, and extensive cleaning of exam room while observing appropriate contact time as indicated for disinfecting solutions.   Past Medical History-  Patient Active Problem List   Diagnosis Date Noted  . Chronic narcotic use 10/10/2016    Priority: High  . Arthritis of right ankle 02/11/2014    Priority: Medium  . BPH (benign prostatic hyperplasia) 12/17/2006    Priority: Medium  . Hyperlipidemia 08/19/2006    Priority: Medium  . Essential hypertension 08/19/2006    Priority: Medium  . Allergic rhinitis 02/11/2014    Priority: Low  . Family history of melanoma 02/11/2014    Priority: Low  . GERD (gastroesophageal reflux disease) 02/11/2014    Priority: Low  . Former smoker 02/11/2014    Priority: Low  . INSOMNIA, CHRONIC 07/01/2009    Priority: Low  . Actinic keratosis 12/17/2006    Priority: Low  . History of colonic polyps 08/26/2006    Priority: Low  . Arthritis of left foot 08/27/2016    Medications- reviewed and updated Current Outpatient Medications  Medication Sig Dispense Refill  . amLODipine-benazepril (LOTREL) 5-20 MG capsule TAKE ONE CAPSULE BY MOUTH DAILY 90 capsule 2  . atorvastatin (LIPITOR) 40 MG tablet TAKE ONE TABLET BY MOUTH DAILY 90 tablet 2  . Azelastine HCl 0.15 % SOLN Place 1 spray into the nose as needed.    . finasteride (PROSCAR) 5 MG tablet Take 1 tablet (5 mg total) by mouth daily. 90 tablet 3  . fluticasone (FLONASE) 50 MCG/ACT nasal spray SPRAY TWO SPRAYS IN EACH NOSTRIL ONCE DAILY 48 g 1  . levocetirizine  (XYZAL) 5 MG tablet     . meloxicam (MOBIC) 15 MG tablet TAKE ONE TABLET BY MOUTH DAILY 90 tablet 0  . omeprazole (PRILOSEC) 20 MG capsule Take 1 capsule (20 mg total) by mouth daily. 90 capsule 3   No current facility-administered medications for this visit.     Objective:  BP 118/72   Pulse 70   Temp 99 F (37.2 C)   Ht 5\' 7"  (1.702 m)   Wt 194 lb (88 kg)   SpO2 97%   BMI 30.38 kg/m  Gen: NAD, resting comfortably CV: RRR no murmurs rubs or gallops Lungs: CTAB no crackles, wheeze, rhonchi Ext: trace edema Skin: warm, dry, see picture below    Assessment and Plan   #hyperlipidemia S: Medication: Atorvastatin 40Mg  Lab Results  Component Value Date   CHOL 126 01/05/2019   HDL 45.30 01/05/2019   LDLCALC 56 01/05/2019   LDLDIRECT 90.0 02/11/2014   TRIG 122.0 01/05/2019   CHOLHDL 3 01/05/2019   A/P: Stable. Continue current medications.   # GERD S:Medication: prilosec 20mg . Still has to be really careful with foods he eats even on this medication and avoiding laying down after eating for a few hours.  Reasonable control on this. He doesn't take anything else  B12 levels related to PPI use:  Lab Results  Component Value Date   VITAMINB12 288 01/02/2018  A/P: with ongoing issues- patient has follow up with  GI. Will continue prilosec for  now. With long term mobic and risk for CKD discussed possible TIF procedure.  - will check b12 with long term high risk med use for lowering b12 - meloxicam likely contributing- try lower dose 7.5mg   #hypertension S: medication: Lotrel 5-20MG  Home readings #s: does not check BP Readings from Last 3 Encounters:  07/14/19 118/72  01/05/19 130/68  10/31/18 122/66  A/P: Stable. Continue current medications.    Pinched Finger S: pt c/o pinched left ring finger that is not healing. This happened 6 weeks ago when working with crab. Got pinched hard and it was latched on for 20-30 seconds. Seemed to be healing then bled heavily the other  day and just does not seem to want to heal up   A/P: pinched lesion on left ring finger with nonhealing wound for 6 weeks- refer to hand surgery to see if he needs debridement or other tips for healing. Continue bandaid for protection. Does not appear infected at present. Does have a vessel right under the area which makes me hesitant to debride.   - up to date tdap 2021  #Arthritis of right ankle S: Chronic pain of right ankle.  Uses marijuana intermittently if has a rought day before he goes to bed which led to failure of drug screen in 2019.  Takes meloxicam on a regular basis.  We opted to check kidney function every 6 months with long-term use A/P: stable control but worried about reflux -Update CMP today - encouraged quitting marijuana   -lets try lower dose meloxicam at 7.5mg  to see if that helps. Another option id like you to try is voltaren gel over the counter on the ankle directly- that may be enough for you to not need meloxicam but would have to use probably 3-4x a day -meloxicam also helps his back and hips so he is hesitant to transition fully to voltaren gel just for ankle  # Obesity  S: Down another 2 pounds from last visit. Active in the yard and trying to eat well Wt Readings from Last 3 Encounters:  07/14/19 194 lb (88 kg)  01/05/19 196 lb 12.8 oz (89.3 kg)  10/31/18 197 lb 6.4 oz (89.5 kg)  A/P: slightly improved. Encouraged need for healthy eating, regular exercise, weight loss.    #BPH-still with nocturia but reasonable control on finasteride.  Recommended follow up: Return in about 6 months (around 01/13/2020) for physical or sooner if needed. Future Appointments  Date Time Provider [place]  09/04/2019  1:50 PM Milus Banister, MD [facility]   Lab/Order associations:   ICD-10-CM   1. Essential hypertension  I10 Comprehensive metabolic panel  2. Gastroesophageal reflux disease without esophagitis  K21.9   3. Hyperlipidemia, unspecified  hyperlipidemia type  E78.5 Comprehensive metabolic panel  4. Nonhealing nonsurgical wound  T14.8XXA Ambulatory referral to Hand Surgery  5. High risk medication use  Z79.899 Vitamin B12    No orders of the defined types were placed in this encounter.  Return precautions advised.  Garret Reddish, MD

## 2019-07-13 NOTE — Patient Instructions (Addendum)
We will call you within two weeks about your referral to hand surgeon to look at finger. If you do not hear within 3 weeks, give [place] a call.   Glad you have follow up with GI about reflux- ask them about the TIF procedure potentially -lets try lower dose meloxicam at 7.5mg  to see if that helps. Another option id like you to try is voltaren gel over the counter on the ankle directly- that may be enough for you to not need meloxicam but would have to use probably 3-4x a day  Please stop by lab before you go If you have mychart- we will send your results within 3 business days of [place] receiving them.  If you do not have mychart- we will call you about results within 5 business days of [place] receiving them.

## 2019-07-14 ENCOUNTER — Encounter: Payer: Self-pay | Admitting: Family Medicine

## 2019-07-14 ENCOUNTER — Ambulatory Visit (INDEPENDENT_AMBULATORY_CARE_PROVIDER_SITE_OTHER): Payer: Medicare PPO | Admitting: Family Medicine

## 2019-07-14 ENCOUNTER — Other Ambulatory Visit: Payer: Self-pay

## 2019-07-14 VITALS — BP 118/72 | HR 70 | Temp 99.0°F | Ht 67.0 in | Wt 194.0 lb

## 2019-07-14 DIAGNOSIS — I10 Essential (primary) hypertension: Secondary | ICD-10-CM

## 2019-07-14 DIAGNOSIS — Z79899 Other long term (current) drug therapy: Secondary | ICD-10-CM | POA: Diagnosis not present

## 2019-07-14 DIAGNOSIS — E785 Hyperlipidemia, unspecified: Secondary | ICD-10-CM

## 2019-07-14 DIAGNOSIS — T148XXA Other injury of unspecified body region, initial encounter: Secondary | ICD-10-CM

## 2019-07-14 DIAGNOSIS — K219 Gastro-esophageal reflux disease without esophagitis: Secondary | ICD-10-CM | POA: Diagnosis not present

## 2019-07-14 DIAGNOSIS — E669 Obesity, unspecified: Secondary | ICD-10-CM

## 2019-07-14 LAB — COMPREHENSIVE METABOLIC PANEL
ALT: 15 U/L (ref 0–53)
AST: 14 U/L (ref 0–37)
Albumin: 4 g/dL (ref 3.5–5.2)
Alkaline Phosphatase: 89 U/L (ref 39–117)
BUN: 19 mg/dL (ref 6–23)
CO2: 27 mEq/L (ref 19–32)
Calcium: 9.2 mg/dL (ref 8.4–10.5)
Chloride: 106 mEq/L (ref 96–112)
Creatinine, Ser: 0.92 mg/dL (ref 0.40–1.50)
GFR: 81.47 mL/min (ref >60.00)
Glucose, Bld: 82 mg/dL (ref 70–99)
Potassium: 3.9 mEq/L (ref 3.5–5.1)
Sodium: 140 mEq/L (ref 135–145)
Total Bilirubin: 0.6 mg/dL (ref 0.2–1.2)
Total Protein: 6.2 g/dL (ref 6.0–8.3)

## 2019-07-14 LAB — VITAMIN B12: Vitamin B-12: 306 pg/mL (ref 211–911)

## 2019-07-14 MED ORDER — MELOXICAM 7.5 MG PO TABS: 7.5000 mg | ORAL_TABLET | Freq: Every day | ORAL | 1 refills | Status: DC

## 2019-07-23 ENCOUNTER — Encounter: Payer: Self-pay | Admitting: Orthopaedic Surgery

## 2019-07-23 ENCOUNTER — Ambulatory Visit: Payer: Medicare PPO | Admitting: Orthopaedic Surgery

## 2019-07-23 ENCOUNTER — Other Ambulatory Visit: Payer: Self-pay

## 2019-07-23 VITALS — Ht 68.0 in | Wt 188.0 lb

## 2019-07-23 DIAGNOSIS — S61205A Unspecified open wound of left ring finger without damage to nail, initial encounter: Secondary | ICD-10-CM

## 2019-07-23 NOTE — Progress Notes (Signed)
Office Visit Note   Patient: Matthew Watson           Date of Birth: 05/28/1950           MRN: [id number] Visit Date: 07/23/2019              Requested by: Marin Olp, MD [place],  [place] [id number] PCP: Marin Olp, MD   Assessment & Plan: Visit Diagnoses:  1. Open wound of left ring finger without damage to nail, initial encounter     Plan: Silver nitrate was touched to this area and covered with a Band-Aid.  I like to recheck it next week.  Call with concerns or questions.  Follow-Up Instructions: Return in about 1 week (around 07/30/2019).   Orders:  No orders of the defined types were placed in this encounter.  No orders of the defined types were placed in this encounter.     Procedures: No procedures performed   Clinical Data: No additional findings.   Subjective: Chief Complaint  Patient presents with  . Left Hand - Pain    HPI patient is a pleasant 69 year old gentleman who comes in today following an injury to his left ring finger.  Approximately 2 months ago he was bit by blue crab to the distal aspect.  Approximately 1 week after, he noticed a hard callus and then peeling of the skin surrounding the wound.  Approximately 3 weeks ago he noticed somewhat of a pea-sized herniation to the area.  This is somewhat tender.  No fevers or chills.  He is not on any blood thinners and no history of diabetes.  He has seen his primary care provider for this and has not been on any antibiotics.  Review of Systems as detailed in HPI.  All others reviewed and are negative.   Objective: Vital Signs: Ht 5\' 8"  (1.727 m)   Wt 188 lb (85.3 kg)   BMI 28.59 kg/m   Physical Exam well-developed well-nourished gentleman in no acute distress.  Alert and oriented x3.  Ortho Exam Hyper granulation tissue on the radial side of his left ring finger without any evidence of infection.  No neurovascular compromise. Specialty Comments:  No specialty  comments available.  Imaging: No new imaging   PMFS History: Patient Active Problem List   Diagnosis Date Noted  . Chronic narcotic use 10/10/2016  . Arthritis of left foot 08/27/2016  . Allergic rhinitis 02/11/2014  . Arthritis of right ankle 02/11/2014  . Family history of melanoma 02/11/2014  . GERD (gastroesophageal reflux disease) 02/11/2014  . Former smoker 02/11/2014  . INSOMNIA, CHRONIC 07/01/2009  . BPH (benign prostatic hyperplasia) 12/17/2006  . Actinic keratosis 12/17/2006  . History of colonic polyps 08/26/2006  . Hyperlipidemia 08/19/2006  . Essential hypertension 08/19/2006   Past Medical History:  Diagnosis Date  . Adenomatous polyp of colon   . Allergy    seasonal  . Arthritis   . BPH (benign prostatic hyperplasia)   . Diverticulosis 09/2010   episode of diverticulitis  . GERD (gastroesophageal reflux disease)   . Hyperlipidemia   . Hypertension   . Low back pain     Family History  Problem Relation Age of Onset  . Cancer Father        melanoma, cousin also died of it  . Cancer Brother        melanoma  . Colon polyps Sister   . Cancer Paternal Uncle  Colon cancer  . Colon cancer Paternal Uncle     Past Surgical History:  Procedure Laterality Date  . ANKLE FRACTURE SURGERY     chronic pain as result  . COLONOSCOPY    . HERNIA REPAIR     jan 2012, [facility]   Social History   Occupational History  . Not on file  Tobacco Use  . Smoking status: Former Smoker    Packs/day: 0.25    Years: 10.00    Pack years: 2.50    Types: Cigarettes    Quit date: 03/23/1990    Years since quitting: 29.3  . Smokeless tobacco: Never Used  . Tobacco comment: quit in 1992  Substance and Sexual Activity  . Alcohol use: Yes    Alcohol/week: 6.0 standard drinks    Types: 6 Cans of beer per week    Comment: 2 beers a day  . Drug use: No  . Sexual activity: Yes

## 2019-07-30 ENCOUNTER — Encounter: Payer: Self-pay | Admitting: Orthopaedic Surgery

## 2019-07-30 ENCOUNTER — Ambulatory Visit: Payer: Medicare PPO | Admitting: Orthopaedic Surgery

## 2019-07-30 VITALS — Ht 66.0 in | Wt 194.2 lb

## 2019-07-30 DIAGNOSIS — S61205A Unspecified open wound of left ring finger without damage to nail, initial encounter: Secondary | ICD-10-CM | POA: Diagnosis not present

## 2019-07-30 NOTE — Progress Notes (Signed)
Patient ID: Matthew Watson, male   DOB: [birth year], 69 y.o.   MRN: [id number]  Matthew Watson returns today for recheck of the hyper granulation tissue to the left ring finger.  He reports no real changes.  The lesion is stable.  It is adhered to the skin with a thin stalk.  There is no evidence of infection.  No neurovascular compromise.  Silver nitrate was touched to the stalk of the lesion.  This was covered with a Band-Aid.  I would like to recheck him in another week.

## 2019-08-11 ENCOUNTER — Encounter: Payer: Self-pay | Admitting: Orthopaedic Surgery

## 2019-08-11 ENCOUNTER — Ambulatory Visit: Payer: Medicare PPO | Admitting: Orthopaedic Surgery

## 2019-08-11 VITALS — Ht 66.5 in | Wt 194.8 lb

## 2019-08-11 DIAGNOSIS — S61205A Unspecified open wound of left ring finger without damage to nail, initial encounter: Secondary | ICD-10-CM

## 2019-08-11 NOTE — Progress Notes (Signed)
Office Visit Note   Patient: Matthew Watson           Date of Birth: 1950-11-20           MRN: [id number] Visit Date: 08/11/2019              Requested by: Marin Olp, MD [place],  [place] [id number] PCP: Marin Olp, MD   Assessment & Plan: Visit Diagnoses:  1. Open wound of left ring finger without damage to nail, initial encounter     Plan: We have treated this twice with this silver nitrate and the mass does not appear to be affected at all.  At this point he would like to have it removed after discussion of treatment options.  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  We will set him up with surgery in the near future.  Questions encouraged and answered.  Follow-Up Instructions: Return if symptoms worsen or fail to improve.   Orders:  No orders of the defined types were placed in this encounter.  No orders of the defined types were placed in this encounter.     Procedures: No procedures performed   Clinical Data: No additional findings.   Subjective: Chief Complaint  Patient presents with   Left Hand - Wound Check, Follow-up    Matthew Watson returns today for evaluation of the mass.  No reports no changes.   Review of Systems   Objective: Vital Signs: Ht 5' 6.5" (1.689 m)    Wt 194 lb 12.8 oz (88.4 kg)    BMI 30.97 kg/m   Physical Exam  Ortho Exam Left ring finger continues to demonstrate the fungating mass.  No signs of infection.  Slightly tender to touch. Specialty Comments:  No specialty comments available.  Imaging: No results found.   PMFS History: Patient Active Problem List   Diagnosis Date Noted   Chronic narcotic use 10/10/2016   Arthritis of left foot 08/27/2016   Allergic rhinitis 02/11/2014   Arthritis of right ankle 02/11/2014   Family history of melanoma 02/11/2014   GERD (gastroesophageal reflux disease) 02/11/2014   Former smoker 02/11/2014    INSOMNIA, CHRONIC 07/01/2009   BPH (benign prostatic hyperplasia) 12/17/2006   Actinic keratosis 12/17/2006   History of colonic polyps 08/26/2006   Hyperlipidemia 08/19/2006   Essential hypertension 08/19/2006   Past Medical History:  Diagnosis Date   Adenomatous polyp of colon    Allergy    seasonal   Arthritis    BPH (benign prostatic hyperplasia)    Diverticulosis 09/2010   episode of diverticulitis   GERD (gastroesophageal reflux disease)    Hyperlipidemia    Hypertension    Low back pain     Family History  Problem Relation Age of Onset   Cancer Father        melanoma, cousin also died of it   Cancer Brother        melanoma   Colon polyps Sister    Cancer Paternal Uncle        Colon cancer   Colon cancer Paternal Uncle     Past Surgical History:  Procedure Laterality Date   ANKLE FRACTURE SURGERY     chronic pain as result   COLONOSCOPY     HERNIA REPAIR     jan 2012, [place]   Social History   Occupational History   Not on file  Tobacco  Use   Smoking status: Former Smoker    Packs/day: 0.25    Years: 10.00    Pack years: 2.50    Types: Cigarettes    Quit date: 03/23/1990    Years since quitting: 29.4   Smokeless tobacco: Never Used   Tobacco comment: quit in 1992  Substance and Sexual Activity   Alcohol use: Yes    Alcohol/week: 6.0 standard drinks    Types: 6 Cans of beer per week    Comment: 2 beers a day   Drug use: No   Sexual activity: Yes

## 2019-08-13 ENCOUNTER — Encounter: Payer: Self-pay | Admitting: Orthopaedic Surgery

## 2019-08-13 ENCOUNTER — Other Ambulatory Visit: Payer: Self-pay

## 2019-08-13 DIAGNOSIS — L980 Pyogenic granuloma: Secondary | ICD-10-CM | POA: Diagnosis not present

## 2019-08-13 DIAGNOSIS — R2232 Localized swelling, mass and lump, left upper limb: Secondary | ICD-10-CM | POA: Diagnosis not present

## 2019-08-14 ENCOUNTER — Telehealth: Payer: Self-pay | Admitting: Orthopaedic Surgery

## 2019-08-14 NOTE — Telephone Encounter (Signed)
Patient called returning Dr. Erlinda Hong call. Patient stated to have had surgery with Dr.Xu yesterday's date 08/13/19. Patient called to update Dr. Erlinda Hong that is is doing very well. No return phone call needed. If any questions or concerns patient phone number is [phone].

## 2019-08-20 ENCOUNTER — Telehealth: Payer: Self-pay

## 2019-08-20 ENCOUNTER — Other Ambulatory Visit: Payer: Self-pay | Admitting: Orthopaedic Surgery

## 2019-08-20 MED ORDER — CIPROFLOXACIN HCL 500 MG PO TABS: 500.0000 mg | ORAL_TABLET | Freq: Two times a day (BID) | ORAL | 0 refills | Status: DC

## 2019-08-20 NOTE — Telephone Encounter (Signed)
Called patient. No answer. Left message that based on his recent culture results Dr.Xu has sent Cipro to his [place] to take. I ask him to call back if he had any questions.

## 2019-08-21 ENCOUNTER — Ambulatory Visit (INDEPENDENT_AMBULATORY_CARE_PROVIDER_SITE_OTHER): Payer: Medicare PPO | Admitting: Orthopaedic Surgery

## 2019-08-21 ENCOUNTER — Encounter: Payer: Self-pay | Admitting: Orthopaedic Surgery

## 2019-08-21 ENCOUNTER — Other Ambulatory Visit: Payer: Self-pay

## 2019-08-21 VITALS — Ht 66.5 in | Wt 194.2 lb

## 2019-08-21 DIAGNOSIS — L980 Pyogenic granuloma: Secondary | ICD-10-CM | POA: Insufficient documentation

## 2019-08-21 NOTE — Progress Notes (Signed)
Patient ID: Matthew Watson, male   DOB: [birth year], 69 y.o.   MRN: [id number]  [name] returns today status post removal of the mass from his left ring finger.  The pathology was consistent with a pyogenic granuloma and the tissue cultures showed abundant Pseudomonas that was pansensitive including Cipro.  I have placed him on Cipro in the meantime.  He is doing well and reports no signs or symptoms of infection.  The wound has almost completely healed up.  There is no drainage.  At this point he will finish out his Cipro as prescribed.  Activity as tolerated to the left hand.  I do not feel like he needs to keep a Band-Aid on it anymore.  Questions encouraged and answered.  Follow-up as needed.

## 2019-09-01 DIAGNOSIS — H1045 Other chronic allergic conjunctivitis: Secondary | ICD-10-CM | POA: Diagnosis not present

## 2019-09-01 DIAGNOSIS — J3089 Other allergic rhinitis: Secondary | ICD-10-CM | POA: Diagnosis not present

## 2019-09-01 DIAGNOSIS — K219 Gastro-esophageal reflux disease without esophagitis: Secondary | ICD-10-CM | POA: Diagnosis not present

## 2019-09-04 ENCOUNTER — Ambulatory Visit: Payer: Medicare PPO | Admitting: Gastroenterology

## 2019-09-04 ENCOUNTER — Encounter: Payer: Self-pay | Admitting: Gastroenterology

## 2019-09-04 DIAGNOSIS — K219 Gastro-esophageal reflux disease without esophagitis: Secondary | ICD-10-CM | POA: Diagnosis not present

## 2019-09-04 DIAGNOSIS — R1310 Dysphagia, unspecified: Secondary | ICD-10-CM | POA: Diagnosis not present

## 2019-09-04 MED ORDER — FAMOTIDINE 20 MG PO TABS
20.0000 mg | ORAL_TABLET | Freq: Every day | ORAL | Status: AC
Start: 2019-09-04 — End: ?

## 2019-09-04 MED ORDER — OMEPRAZOLE 20 MG PO CPDR: DELAYED_RELEASE_CAPSULE | ORAL | 3 refills | Status: DC

## 2019-09-04 NOTE — Patient Instructions (Signed)
If you are age 69 or older, your body mass index should be between 23-30. Your Body mass index is 30.15 kg/m. If this is out of the aforementioned range listed, please consider follow up with your Primary Care Provider.  If you are age 33 or younger, your body mass index should be between 19-25. Your Body mass index is 30.15 kg/m. If this is out of the aformentioned range listed, please consider follow up with your Primary Care Provider.   Please purchase the following medications over the counter and take as directed:  START: pepcid 20mg  take one tablet every night at bedtime.  TAKE: Omeprazole 20mg  one capsule 20-30 minutes prior to breakfast.  You have been scheduled for an endoscopy. Please follow written instructions given to you at your visit today. If you use inhalers (even only as needed), please bring them with you on the day of your procedure.  Due to recent changes in healthcare laws, you may see the results of your imaging and laboratory studies on MyChart before your provider has had a chance to review them.  We understand that in some cases there may be results that are confusing or concerning to you. Not all laboratory results come back in the same time frame and the provider may be waiting for multiple results in order to interpret others.  Please give [place] 48 hours in order for your provider to thoroughly review all the results before contacting the office for clarification of your results.   Thank you for entrusting me with your care and choosing [facility].  Dr Ardis Hughs

## 2019-09-04 NOTE — Progress Notes (Signed)
Review of pertinent gastrointestinal problems: 1. Personal history of colonic polyps: colonoscopy 2007 1 subCM adenoma; colonoscopy 2012 two polyps, one was a subCM adenoma. Colonoscopy 05/2016 No polyps, recommended recall at 10 years. 2. Incidental diverticulosis noted on colonoscopies.   HPI: This is a very pleasant 70 year old man whom   I last saw almost 3 years ago for some hemorrhoidal bleeding.  He is here today for a different problem.  He has had issues with GERD for about 10 years.  He describes an event where he drank too much beer and passed out about 10 years ago and woke up with extreme burning in his chest.  I believe he has been on omeprazole 20 mg once daily since then.  He takes it about 5 PM.  He is usually nauseous in the morning.  He often wakes up in the middle the night with a gagging type nausea.  It is not necessarily burning in his chest however.  He has occasional dysphagia to both liquids and solids.  He has lost 30 pounds intentionally in the past 2 months by stopping drinking beer and eating ice cream.  He has no abdominal pains  Blood work June 2021 normal complete metabolic profile and normal B12 level.   Review of systems: Pertinent positive and negative review of systems were noted in the above HPI section. All other review negative.   Past Medical History:  Diagnosis Date   Adenomatous polyp of colon    Allergy    seasonal   Arthritis    BPH (benign prostatic hyperplasia)    Diverticulosis 09/2010   episode of diverticulitis   GERD (gastroesophageal reflux disease)    Hyperlipidemia    Hypertension    Low back pain     Past Surgical History:  Procedure Laterality Date   ANKLE FRACTURE SURGERY     chronic pain as result   COLONOSCOPY     HERNIA REPAIR     jan 2012, [place]    Current Outpatient Medications  Medication Sig Dispense Refill   amLODipine-benazepril (LOTREL) 5-20 MG capsule TAKE ONE CAPSULE BY MOUTH DAILY 90  capsule 2   atorvastatin (LIPITOR) 40 MG tablet TAKE ONE TABLET BY MOUTH DAILY 90 tablet 2   Azelastine HCl 0.15 % SOLN Place 1 spray into the nose as needed.     finasteride (PROSCAR) 5 MG tablet Take 1 tablet (5 mg total) by mouth daily. 90 tablet 3   fluticasone (FLONASE) 50 MCG/ACT nasal spray SPRAY TWO SPRAYS IN EACH NOSTRIL ONCE DAILY 48 g 1   meloxicam (MOBIC) 7.5 MG tablet Take 1 tablet (7.5 mg total) by mouth daily. 90 tablet 1   omeprazole (PRILOSEC) 20 MG capsule Take 1 capsule (20 mg total) by mouth daily. 90 capsule 3   No current facility-administered medications for this visit.    Allergies as of 09/04/2019   (No Known Allergies)    Family History  Problem Relation Age of Onset   Cancer Father        melanoma, cousin also died of it   Cancer Brother        melanoma   Colon polyps Sister    Cancer Paternal Uncle        Colon cancer   Colon cancer Paternal Uncle     Social History   Socioeconomic History   Marital status: Married    Spouse name: Not on file   Number of children: Not on file   Years of education: Not  on file   Highest education level: Not on file  Occupational History   Not on file  Tobacco Use   Smoking status: Former Smoker    Packs/day: 0.25    Years: 10.00    Pack years: 2.50    Types: Cigarettes    Quit date: 03/23/1990    Years since quitting: 29.4   Smokeless tobacco: Never Used   Tobacco comment: quit in 1992  Substance and Sexual Activity   Alcohol use: Yes    Alcohol/week: 6.0 standard drinks    Types: 6 Cans of beer per week    Comment: 2 beers a day   Drug use: No   Sexual activity: Yes  Other Topics Concern   Not on file  Social History Narrative   Married >30 years in 2016. No children. 2 dogs.       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      Hobbies: Rabbit hunting, fishing, duck hunt   Social Determinants of Radio broadcast assistant Strain:    Difficulty of Paying Living Expenses:  Not on [employer] Insecurity:    Worried About Charity fundraiser in the Last Year: Not on file   [place] in the Last Year: Not on file  Transportation Needs:    Film/video editor (Medical): Not on file   Lack of Transportation (Non-Medical): Not on file  Physical Activity:    Days of Exercise per Week: Not on file   Minutes of Exercise per Session: Not on file  Stress:    Feeling of Stress : Not on file  Social Connections:    Frequency of Communication with Friends and Family: Not on file   Frequency of Social Gatherings with Friends and Family: Not on file   Attends Religious Services: Not on file   Active Member of Clubs or Organizations: Not on file   Attends Archivist Meetings: Not on file   Marital Status: Not on file  Intimate Partner Violence:    Fear of Current or Ex-Partner: Not on file   Emotionally Abused: Not on file   Physically Abused: Not on file   Sexually Abused: Not on file     Physical Exam: BP (!) 148/80    Pulse 70    Ht 5' 7.5" (1.715 m)    Wt 195 lb 6.4 oz (88.6 kg)    BMI 30.15 kg/m  Constitutional: generally well-appearing Psychiatric: alert and oriented x3 Eyes: extraocular movements intact Mouth: oral pharynx moist, no lesions Neck: supple no lymphadenopathy Cardiovascular: heart regular rate and rhythm Lungs: clear to auscultation bilaterally Abdomen: soft, nontender, nondistended, no obvious ascites, no peritoneal signs, normal bowel sounds Extremities: no lower extremity edema bilaterally Skin: no lesions on visible extremities   Assessment and plan: 69 y.o. male with GERD, dysphagia  I do think a lot of his symptoms might be related to GERD.  He has intermittent dysphagia and some weight loss that is probably mostly intentional.  I recommended that he change the way he is taking his proton pump inhibitor so that he takes it 20 to 30 minutes prior to his breakfast meal and that he also start taking  an over-the-counter Pepcid famotidine 20 mg pill at bedtime every night.  I also recommended upper endoscopy to check for GERD related damage, other potential etiologies of dysphagia such as infection, stricture, neoplasm which I think is unlikely.  Please see the "Patient Instructions" section for addition details  about the plan.   Owens Loffler, MD [facility] 09/04/2019, 1:50 PM  Cc: Marin Olp, MD  Total time on date of encounter was 40  minutes (this included time spent preparing to see the patient reviewing records; obtaining and/or reviewing separately obtained history; performing a medically appropriate exam and/or evaluation; counseling and educating the patient and family if present; ordering medications, tests or procedures if applicable; and documenting clinical information in the health record).

## 2019-09-08 ENCOUNTER — Other Ambulatory Visit: Payer: Self-pay

## 2019-09-08 ENCOUNTER — Ambulatory Visit (AMBULATORY_SURGERY_CENTER): Payer: Medicare PPO | Admitting: Gastroenterology

## 2019-09-08 ENCOUNTER — Encounter: Payer: Self-pay | Admitting: Gastroenterology

## 2019-09-08 VITALS — BP 116/71 | HR 54 | Temp 98.6°F | Resp 12 | Ht 67.0 in | Wt 195.0 lb

## 2019-09-08 DIAGNOSIS — K449 Diaphragmatic hernia without obstruction or gangrene: Secondary | ICD-10-CM | POA: Diagnosis not present

## 2019-09-08 DIAGNOSIS — R1310 Dysphagia, unspecified: Secondary | ICD-10-CM | POA: Diagnosis not present

## 2019-09-08 DIAGNOSIS — R12 Heartburn: Secondary | ICD-10-CM | POA: Diagnosis not present

## 2019-09-08 DIAGNOSIS — K219 Gastro-esophageal reflux disease without esophagitis: Secondary | ICD-10-CM | POA: Diagnosis not present

## 2019-09-08 DIAGNOSIS — I10 Essential (primary) hypertension: Secondary | ICD-10-CM | POA: Diagnosis not present

## 2019-09-08 MED ORDER — SODIUM CHLORIDE 0.9 % IV SOLN: 500.0000 mL | Freq: Once | INTRAVENOUS | Status: DC

## 2019-09-08 NOTE — Progress Notes (Signed)
VS by CW. ?

## 2019-09-08 NOTE — Progress Notes (Signed)
Report to PACU, RN, vss, BBS= Clear.  

## 2019-09-08 NOTE — Patient Instructions (Signed)
YOU HAD AN ENDOSCOPIC PROCEDURE TODAY AT THE [facility]:   Refer to the procedure report that was given to you for any specific questions about what was found during the examination.  If the procedure report does not answer your questions, please call your gastroenterologist to clarify.  If you requested that your care partner not be given the details of your procedure findings, then the procedure report has been included in a sealed envelope for you to review at your convenience later.  YOU SHOULD EXPECT: Some feelings of bloating in the abdomen. Passage of more gas than usual.  Walking can help get rid of the air that was put into your GI tract during the procedure and reduce the bloating. If you had a lower endoscopy (such as a colonoscopy or flexible sigmoidoscopy) you may notice spotting of blood in your stool or on the toilet paper. If you underwent a bowel prep for your procedure, you may not have a normal bowel movement for a few days.  Please Note:  You might notice some irritation and congestion in your nose or some drainage.  This is from the oxygen used during your procedure.  There is no need for concern and it should clear up in a day or so.  SYMPTOMS TO REPORT IMMEDIATELY:    Following upper endoscopy (EGD)  Vomiting of blood or coffee ground material  New chest pain or pain under the shoulder blades  Painful or persistently difficult swallowing  New shortness of breath  Fever of 100F or higher  Black, tarry-looking stools  For urgent or emergent issues, a gastroenterologist can be reached at any hour by calling [phone]. Do not use MyChart messaging for urgent concerns.    DIET:  We do recommend a small meal at first, but then you may proceed to your regular diet.  Drink plenty of fluids but you should avoid alcoholic beverages for 24 hours.  ACTIVITY:  You should plan to take it easy for the rest of today and you should NOT DRIVE or use heavy machinery  until tomorrow (because of the sedation medicines used during the test).    FOLLOW UP: Our staff will call the number listed on your records 48-72 hours following your procedure to check on you and address any questions or concerns that you may have regarding the information given to you following your procedure. If we do not reach you, we will leave a message.  We will attempt to reach you two times.  During this call, we will ask if you have developed any symptoms of COVID 19. If you develop any symptoms (ie: fever, flu-like symptoms, shortness of breath, cough etc.) before then, please call [phone].  If you test positive for Covid 19 in the 2 weeks post procedure, please call and report this information to us.    If any biopsies were taken you will be contacted by phone or by letter within the next 1-3 weeks.  Please call us at [phone] if you have not heard about the biopsies in 3 weeks.    SIGNATURES/CONFIDENTIALITY: You and/or your care partner have signed paperwork which will be entered into your electronic medical record.  These signatures attest to the fact that that the information above on your After Visit Summary has been reviewed and is understood.  Full responsibility of the confidentiality of this discharge information lies with you and/or your care-partner. 

## 2019-09-08 NOTE — Op Note (Signed)
[facility] Patient Name: Matthew Watson Procedure Date: 09/08/2019 1:51 PM MRN: [id number] Endoscopist: Milus Banister , MD Age: 69 Referring MD:  Date of Birth: Sep 24, 1950 Gender: Male Account #: [id number] Procedure:                Upper GI endoscopy Indications:              Dysphagia, Heartburn Medicines:                Monitored Anesthesia Care Procedure:                Pre-Anesthesia Assessment:                           - Prior to the procedure, a History and Physical                            was performed, and patient medications and                            allergies were reviewed. The patient's tolerance of                            previous anesthesia was also reviewed. The risks                            and benefits of the procedure and the sedation                            options and risks were discussed with the patient.                            All questions were answered, and informed consent                            was obtained. Prior Anticoagulants: The patient has                            taken no previous anticoagulant or antiplatelet                            agents. ASA Grade Assessment: II - A patient with                            mild systemic disease. After reviewing the risks                            and benefits, the patient was deemed in                            satisfactory condition to undergo the procedure.                           After obtaining informed consent, the endoscope was  passed under direct vision. Throughout the                            procedure, the patient's blood pressure, pulse, and                            oxygen saturations were monitored continuously. The                            Endoscope was introduced through the mouth, and                            advanced to the second part of duodenum. The upper                            GI endoscopy was accomplished  without difficulty.                            The patient tolerated the procedure well. Scope In: Scope Out: Findings:                 A small hiatal hernia was present.                           The exam was otherwise without abnormality. Complications:            No immediate complications. Estimated blood loss:                            None. Estimated Blood Loss:     Estimated blood loss: none. Impression:               - Small hiatal hernia.                           - The examination was otherwise normal. Recommendation:           - Patient has a contact number available for                            emergencies. The signs and symptoms of potential                            delayed complications were discussed with the                            patient. Return to normal activities tomorrow.                            Written discharge instructions were provided to the                            patient.                           - Resume previous diet.                           -  Continue present medications. Omeprazole in the                            morning before breakfast and pepcid at bedtime                            every night. Milus Banister, MD 09/08/2019 2:05:32 PM This report has been signed electronically.

## 2019-09-10 ENCOUNTER — Telehealth: Payer: Self-pay | Admitting: *Deleted

## 2019-09-10 ENCOUNTER — Other Ambulatory Visit: Payer: Self-pay | Admitting: Family Medicine

## 2019-09-10 NOTE — Telephone Encounter (Signed)
°  Follow up Call-  Call back number 09/08/2019  Post procedure Call Back phone  # [phone]  Permission to leave phone message Yes  Some recent data might be hidden     Patient questions:  Do you have a fever, pain , or abdominal swelling? No. Pain Score  0 *  Have you tolerated food without any problems? Yes.    Have you been able to return to your normal activities? Yes.    Do you have any questions about your discharge instructions: Diet   No. Medications  No. Follow up visit  No.  Do you have questions or concerns about your Care? No.  Actions: * If pain score is 4 or above: No action needed, pain <4.  1. Have you developed a fever since your procedure? no  2.   Have you had an respiratory symptoms (SOB or cough) since your procedure? no  3.   Have you tested positive for COVID 19 since your procedure no  4.   Have you had any family members/close contacts diagnosed with the COVID 19 since your procedure?  no   If yes to any of these questions please route to Joylene John, RN and Joella Prince, RN

## 2019-12-03 ENCOUNTER — Ambulatory Visit: Payer: Medicare PPO

## 2019-12-18 ENCOUNTER — Ambulatory Visit: Payer: Medicare PPO

## 2019-12-21 ENCOUNTER — Other Ambulatory Visit: Payer: Self-pay

## 2019-12-21 ENCOUNTER — Ambulatory Visit (INDEPENDENT_AMBULATORY_CARE_PROVIDER_SITE_OTHER): Payer: Medicare PPO

## 2019-12-21 VITALS — BP 120/74 | HR 65 | Resp 20 | Wt 198.4 lb

## 2019-12-21 DIAGNOSIS — Z0000 Encounter for general adult medical examination without abnormal findings: Secondary | ICD-10-CM | POA: Diagnosis not present

## 2019-12-21 NOTE — Progress Notes (Addendum)
Subjective:   Matthew Watson is a 69 y.o. male who presents for Medicare Annual/Subsequent preventive examination.  Review of Systems     Cardiac Risk Factors include: advanced age (>55men, >83 women);dyslipidemia;hypertension;male gender;obesity (BMI >30kg/m2)     Objective:    Today's Vitals   12/21/19 0844  BP: 120/74  Pulse: 65  Resp: 20  SpO2: 99%  Weight: 198 lb 6.4 oz (90 kg)  PainSc: 0-No pain   Body mass index is 31.07 kg/m.  Advanced Directives 12/21/2019 09/08/2019 10/31/2018 01/03/2017 05/18/2016  Does Patient Have a Medical Advance Directive? Yes Yes Yes Yes Yes  Type of Advance Directive Living will;Healthcare Power of [place];Living will Living will;Healthcare Power of Attorney - -  Does patient want to make changes to medical advance directive? - No - Patient declined No - Patient declined - -  Copy of [place] in Chart? No - copy requested No - copy requested No - copy requested - -    Current Medications (verified) Outpatient Encounter Medications as of 12/21/2019  Medication Sig  . amLODipine-benazepril (LOTREL) 5-20 MG capsule TAKE ONE CAPSULE BY MOUTH DAILY  . atorvastatin (LIPITOR) 40 MG tablet TAKE ONE TABLET BY MOUTH DAILY  . Azelastine HCl 0.15 % SOLN Place 1 spray into the nose as needed.  . famotidine (PEPCID) 20 MG tablet Take 1 tablet (20 mg total) by mouth at bedtime.  . finasteride (PROSCAR) 5 MG tablet TAKE ONE TABLET BY MOUTH DAILY  . fluticasone (FLONASE) 50 MCG/ACT nasal spray SPRAY TWO SPRAYS IN EACH NOSTRIL ONCE DAILY  . meloxicam (MOBIC) 7.5 MG tablet Take 1 tablet (7.5 mg total) by mouth daily.  Marland Kitchen omeprazole (PRILOSEC) 20 MG capsule Take 1 capsule 20-30 minutes prior to breakfast.   No facility-administered encounter medications on file as of 12/21/2019.    Allergies (verified) Dust mite mixed allergen ext [mite (d. farinae)]   History: Past Medical History:  Diagnosis Date  .  Adenomatous polyp of colon   . Allergy    seasonal  . Arthritis   . BPH (benign prostatic hyperplasia)   . Diverticulosis 09/2010   episode of diverticulitis  . GERD (gastroesophageal reflux disease)   . Hyperlipidemia   . Hypertension   . Low back pain    Past Surgical History:  Procedure Laterality Date  . ANKLE FRACTURE SURGERY     chronic pain as result  . COLONOSCOPY    . FINGER SURGERY Left    bite by a crab  . HERNIA REPAIR     jan 2012, [facility]   Family History  Problem Relation Age of Onset  . Cancer Father        melanoma, cousin also died of it  . Cancer Brother        melanoma  . Colon polyps Sister   . Cancer Paternal Uncle        Colon cancer  . Colon cancer Paternal Uncle   . Esophageal cancer Neg Hx   . Stomach cancer Neg Hx    Social History   Socioeconomic History  . Marital status: Married    Spouse name: Not on file  . Number of children: Not on file  . Years of education: Not on file  . Highest education level: Not on file  Occupational History  . Occupation: retired  Tobacco Use  . Smoking status: Former Smoker    Packs/day: 0.25    Years: 10.00    Pack  years: 2.50    Types: Cigarettes    Quit date: 03/23/1990    Years since quitting: 29.7  . Smokeless tobacco: Never Used  . Tobacco comment: quit in 1992  Vaping Use  . Vaping Use: Never used  Substance and Sexual Activity  . Alcohol use: Yes    Alcohol/week: 6.0 standard drinks    Types: 6 Cans of beer per week  . Drug use: No  . Sexual activity: Yes  Other Topics Concern  . Not on file  Social History Narrative   Married >30 years in 2016. No children. 2 dogs.       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 Difficulty of Paying Living Expenses: Not hard at all  Food Insecurity: No Food Insecurity  . Worried About Charity fundraiser in the Last Year: Never  true  . Ran Out of Food in the Last Year: Never true  Transportation Needs: No Transportation Needs  . Lack of Transportation (Medical): No  . Lack of Transportation (Non-Medical): No  Physical Activity: Inactive  . Days of Exercise per Week: 0 days  . Minutes of Exercise per Session: 0 min  Stress: No Stress Concern Present  . Feeling of Stress : Not at all  Social Connections: Moderately Isolated  . Frequency of Communication with Friends and Family: More than three times a week  . Frequency of Social Gatherings with Friends and Family: Three times a week  . Attends Religious Services: Never  . Active Member of Clubs or Organizations: No  . Attends Archivist Meetings: Never  . Marital Status: Married    Tobacco Counseling Counseling given: Not Answered Comment: quit in 1992   Clinical Intake:  Pre-visit preparation completed: Yes  Pain Score: 0-No pain     BMI - recorded: 31.07 Nutritional Status: BMI > 30  Obese Nutritional Risks: Nausea/ vomitting/ diarrhea (nausea in a.m at times) Diabetes: No  How often do you need to have someone help you when you read instructions, pamphlets, or other written materials from your doctor or pharmacy?: 1 - Never  Diabetic?No  Interpreter Needed?: No  Information entered by :: Charlott Rakes, LPN   Activities of Daily Living In your present state of health, do you have any difficulty performing the following activities: 12/21/2019  Hearing? Y  Comment mild loss and ringing at times  Vision? N  Difficulty concentrating or making decisions? N  Walking or climbing stairs? Y  Dressing or bathing? N  Doing errands, shopping? N  Preparing Food and eating ? N  Using the Toilet? N  In the past six months, have you accidently leaked urine? N  Do you have problems with loss of bowel control? N  Managing your Medications? N  Managing your Finances? N  Housekeeping or managing your Housekeeping? N  Some recent data  might be hidden    Patient Care Team: Marin Olp, MD as PCP - General (Family Medicine) Wylene Simmer, MD as Consulting Physician (Orthopedic Surgery) Harold Hedge, Darrick Grinder, MD as Consulting Physician (Allergy and Immunology) Dermatology, [facility] as Consulting Physician Jerline Pain Mingo Amber, DO as Consulting Physician (Ophthalmology)  Indicate any recent Medical Services you may have received from other than [facility] providers in the past year (date may be approximate).     Assessment:   This is a routine  wellness examination for [place].  Hearing/Vision screen  Hearing Screening   125Hz  250Hz  500Hz  1000Hz  2000Hz  3000Hz  4000Hz  6000Hz  8000Hz   Right ear:           Left ear:           Comments: Ringing in ears and mild loss at times  Vision Screening Comments: Follows up with Dr Jerline Pain at [facility] annually  Dietary issues and exercise activities discussed: Current Exercise Habits: The patient does not participate in regular exercise at present (works in yard and walks when go to [employer])  Goals    . Exercise 150 min/wk Moderate Activity     Stretching in the am  Riding the bike in the gym; wife will retire in 30 days and you both can go to the gym    . Patient Stated     Lose weight from thanksgiving      Depression Screen PHQ 2/9 Scores 12/21/2019 07/14/2019 01/05/2019 10/31/2018 07/04/2018 01/02/2018 01/03/2017  PHQ - 2 Score 0 0 0 0 0 0 0    Fall Risk Fall Risk  12/21/2019 01/05/2019 10/31/2018 07/04/2018 01/02/2018  Falls in the past year? 0 0 1 0 1  Number falls in past yr: 0 0 - - 1  Comment - - - - -  Injury with Fall? 0 0 0 - 0  Risk for fall due to : Impaired vision;Impaired balance/gait - - - Impaired balance/gait  Follow up Falls prevention discussed - Education provided;Falls prevention discussed;Falls evaluation completed - Falls evaluation completed    FALL RISK PREVENTION PERTAINING TO THE HOME:  Any stairs in or around the home? No  If so, are  there any without handrails? No  Home free of loose throw rugs in walkways, pet beds, electrical cords, etc? Yes  Adequate lighting in your home to reduce risk of falls? Yes   ASSISTIVE DEVICES UTILIZED TO PREVENT FALLS:  Life alert? No  Use of a cane, walker or w/c? Yes  Grab bars in the bathroom? Yes  Shower chair or bench in shower? Yes   But don't use at this time Elevated toilet seat or a handicapped toilet? Yes   TIMED UP AND GO:  Was the test performed? Yes .  Length of time to ambulate 10 feet: 15 sec.   Gait slow and steady without use of assistive device  Cognitive Function: MMSE - Mini Mental State Exam 01/03/2017  Not completed: (No Data)     6CIT Screen 12/21/2019 10/31/2018  What Year? 0 points 0 points  What month? 0 points 0 points  What time? - 0 points  Count back from 20 0 points 0 points  Months in reverse 0 points 0 points  Repeat phrase 0 points 0 points  Total Score - 0    Immunizations Immunization History  Administered Date(s) Administered  . Fluad Quad(high Dose 65+) 10/14/2018  . Influenza Split 11/21/2011  . Influenza Whole 11/18/2008, 01/11/2010  . Influenza, High Dose Seasonal PF 12/20/2015, 11/19/2016, 11/19/2017  . Influenza,inj,Quad PF,6+ Mos 11/11/2012, 11/18/2013, 12/07/2014  . Influenza-Unspecified 10/26/2019  . PFIZER SARS-COV-2 Vaccination 02/20/2019, 03/17/2019  . Pneumococcal Conjugate-13 02/15/2015  . Pneumococcal Polysaccharide-23 02/16/2016  . Td 01/15/2005  . Tdap 10/10/2016  . Zoster 07/14/2012  . Zoster Recombinat (Shingrix) 05/21/2017, 08/13/2017    TDAP status: Up to date  Flu Vaccine status: Up to date Done 10/26/19  Pneumococcal vaccine status: Up to date  Covid-19 vaccine status: Completed vaccines  Qualifies for Shingles Vaccine?  Yes   Zostavax completed Yes   Shingrix Completed?: Yes  Screening Tests Health Maintenance  Topic Date Due  . COLONOSCOPY  05/19/2026  . TETANUS/TDAP  10/11/2026  .  INFLUENZA VACCINE  Completed  . COVID-19 Vaccine  Completed  . PNA vac Low Risk Adult  Completed    Health Maintenance  There are no preventive care reminders to display for this patient.  Colorectal cancer screening: Type of screening: Colonoscopy. Completed 05/18/16. Repeat every 10 years   Additional Screening:  Hepatitis C Screening: does not qualify  Vision Screening: Recommended annual ophthalmology exams for early detection of glaucoma and other disorders of the eye. Is the patient up to date with their annual eye exam?  Yes  Who is the provider or what is the name of the office in which the patient attends annual eye exams? Dr Jerline Pain at [facility]    Dental Screening: Recommended annual dental exams for proper oral hygiene  Community Resource Referral / Chronic Care Management: CRR required this visit?  No   CCM required this visit?  No      Plan:     I have personally reviewed and noted the following in the patient's chart:   . Medical and social history . Use of alcohol, tobacco or illicit drugs  . Current medications and supplements . Functional ability and status . Nutritional status . Physical activity . Advanced directives . List of other physicians . Hospitalizations, surgeries, and ER visits in previous 12 months . Vitals . Screenings to include cognitive, depression, and falls . Referrals and appointments  In addition, I have reviewed and discussed with patient certain preventive protocols, quality metrics, and best practice recommendations. A written personalized care plan for preventive services as well as general preventive health recommendations were provided to patient.     Willette Brace, LPN   [id number]   Nurse Notes: None

## 2019-12-21 NOTE — Patient Instructions (Signed)
[name] , Thank you for taking time to come for your Medicare Wellness Visit. I appreciate your ongoing commitment to your health goals. Please review the following plan we discussed and let me know if I can assist you in the future.   Screening recommendations/referrals: Colonoscopy: Done 05/18/16 Recommended yearly ophthalmology/optometry visit for glaucoma screening and checkup Recommended yearly dental visit for hygiene and checkup  Vaccinations: Influenza vaccine: Done 10/26/19 Up to date Pneumococcal vaccine: Up to date Tdap vaccine: Up to date Shingles vaccine: Completed 05/21/17 & 08/13/17   Covid-19: Completed 02/20/19 & 03/17/19  Advanced directives: Please bring a copy of your health care power of attorney and living will to the office at your convenience.  Conditions/risks identified: Lose weight from thanksgiving   Next appointment: Follow up in one year for your annual wellness visit.   Preventive Care 69 Years and Older, Male Preventive care refers to lifestyle choices and visits with your health care provider that can promote health and wellness. What does preventive care include?  A yearly physical exam. This is also called an annual well check.  Dental exams once or twice a year.  Routine eye exams. Ask your health care provider how often you should have your eyes checked.  Personal lifestyle choices, including:  Daily care of your teeth and gums.  Regular physical activity.  Eating a healthy diet.  Avoiding tobacco and drug use.  Limiting alcohol use.  Practicing safe sex.  Taking low doses of aspirin every day.  Taking vitamin and mineral supplements as recommended by your health care provider. What happens during an annual well check? The services and screenings done by your health care provider during your annual well check will depend on your age, overall health, lifestyle risk factors, and family history of disease. Counseling  Your health care  provider may ask you questions about your:  Alcohol use.  Tobacco use.  Drug use.  Emotional well-being.  Home and relationship well-being.  Sexual activity.  Eating habits.  History of falls.  Memory and ability to understand (cognition).  Work and work Statistician. Screening  You may have the following tests or measurements:  Height, weight, and BMI.  Blood pressure.  Lipid and cholesterol levels. These may be checked every 5 years, or more frequently if you are over 69 years old.  Skin check.  Lung cancer screening. You may have this screening every year starting at age 69 if you have a 30-pack-year history of smoking and currently smoke or have quit within the past 15 years.  Fecal occult blood test (FOBT) of the stool. You may have this test every year starting at age 69.  Flexible sigmoidoscopy or colonoscopy. You may have a sigmoidoscopy every 5 years or a colonoscopy every 10 years starting at age 69.  Prostate cancer screening. Recommendations will vary depending on your family history and other risks.  Hepatitis C blood test.  Hepatitis B blood test.  Sexually transmitted disease (STD) testing.  Diabetes screening. This is done by checking your blood sugar (glucose) after you have not eaten for a while (fasting). You may have this done every 1-3 years.  Abdominal aortic aneurysm (AAA) screening. You may need this if you are a current or former smoker.  Osteoporosis. You may be screened starting at age 69 if you are at high risk. Talk with your health care provider about your test results, treatment options, and if necessary, the need for more tests. Vaccines  Your health care provider  may recommend certain vaccines, such as:  Influenza vaccine. This is recommended every year.  Tetanus, diphtheria, and acellular pertussis (Tdap, Td) vaccine. You may need a Td booster every 10 years.  Zoster vaccine. You may need this after age 69.  Pneumococcal  13-valent conjugate (PCV13) vaccine. One dose is recommended after age 69.  Pneumococcal polysaccharide (PPSV23) vaccine. One dose is recommended after age 69. Talk to your health care provider about which screenings and vaccines you need and how often you need them. This information is not intended to replace advice given to you by your health care provider. Make sure you discuss any questions you have with your health care provider. Document Released: 01/28/2015 Document Revised: 09/21/2015 Document Reviewed: 11/02/2014 Elsevier Interactive Patient Education  2017 [place] Prevention in the Home Falls can cause injuries. They can happen to people of all ages. There are many things you can do to make your home safe and to help prevent falls. What can I do on the outside of my home?  Regularly fix the edges of walkways and driveways and fix any cracks.  Remove anything that might make you trip as you walk through a door, such as a raised step or threshold.  Trim any bushes or trees on the path to your home.  Use bright outdoor lighting.  Clear any walking paths of anything that might make someone trip, such as rocks or tools.  Regularly check to see if handrails are loose or broken. Make sure that both sides of any steps have handrails.  Any raised decks and porches should have guardrails on the edges.  Have any leaves, snow, or ice cleared regularly.  Use sand or salt on walking paths during winter.  Clean up any spills in your garage right away. This includes oil or grease spills. What can I do in the bathroom?  Use night lights.  Install grab bars by the toilet and in the tub and shower. Do not use towel bars as grab bars.  Use non-skid mats or decals in the tub or shower.  If you need to sit down in the shower, use a plastic, non-slip stool.  Keep the floor dry. Clean up any water that spills on the floor as soon as it happens.  Remove soap buildup in the  tub or shower regularly.  Attach bath mats securely with double-sided non-slip rug tape.  Do not have throw rugs and other things on the floor that can make you trip. What can I do in the bedroom?  Use night lights.  Make sure that you have a light by your bed that is easy to reach.  Do not use any sheets or blankets that are too big for your bed. They should not hang down onto the floor.  Have a firm chair that has side arms. You can use this for support while you get dressed.  Do not have throw rugs and other things on the floor that can make you trip. What can I do in the kitchen?  Clean up any spills right away.  Avoid walking on wet floors.  Keep items that you use a lot in easy-to-reach places.  If you need to reach something above you, use a strong step stool that has a grab bar.  Keep electrical cords out of the way.  Do not use floor polish or wax that makes floors slippery. If you must use wax, use non-skid floor wax.  Do not have throw rugs  and other things on the floor that can make you trip. What can I do with my stairs?  Do not leave any items on the stairs.  Make sure that there are handrails on both sides of the stairs and use them. Fix handrails that are broken or loose. Make sure that handrails are as long as the stairways.  Check any carpeting to make sure that it is firmly attached to the stairs. Fix any carpet that is loose or worn.  Avoid having throw rugs at the top or bottom of the stairs. If you do have throw rugs, attach them to the floor with carpet tape.  Make sure that you have a light switch at the top of the stairs and the bottom of the stairs. If you do not have them, ask someone to add them for you. What else can I do to help prevent falls?  Wear shoes that:  Do not have high heels.  Have rubber bottoms.  Are comfortable and fit you well.  Are closed at the toe. Do not wear sandals.  If you use a stepladder:  Make sure that it  is fully opened. Do not climb a closed stepladder.  Make sure that both sides of the stepladder are locked into place.  Ask someone to hold it for you, if possible.  Clearly mark and make sure that you can see:  Any grab bars or handrails.  First and last steps.  Where the edge of each step is.  Use tools that help you move around (mobility aids) if they are needed. These include:  Canes.  Walkers.  Scooters.  Crutches.  Turn on the lights when you go into a dark area. Replace any light bulbs as soon as they burn out.  Set up your furniture so you have a clear path. Avoid moving your furniture around.  If any of your floors are uneven, fix them.  If there are any pets around you, be aware of where they are.  Review your medicines with your doctor. Some medicines can make you feel dizzy. This can increase your chance of falling. Ask your doctor what other things that you can do to help prevent falls. This information is not intended to replace advice given to you by your health care provider. Make sure you discuss any questions you have with your health care provider. Document Released: 10/28/2008 Document Revised: 06/09/2015 Document Reviewed: 02/05/2014 Elsevier Interactive Patient Education  2017 [employer].

## 2020-01-12 DIAGNOSIS — H40023 Open angle with borderline findings, high risk, bilateral: Secondary | ICD-10-CM | POA: Diagnosis not present

## 2020-01-12 DIAGNOSIS — H10413 Chronic giant papillary conjunctivitis, bilateral: Secondary | ICD-10-CM | POA: Diagnosis not present

## 2020-01-12 DIAGNOSIS — H0100A Unspecified blepharitis right eye, upper and lower eyelids: Secondary | ICD-10-CM | POA: Diagnosis not present

## 2020-01-12 DIAGNOSIS — H2513 Age-related nuclear cataract, bilateral: Secondary | ICD-10-CM | POA: Diagnosis not present

## 2020-01-13 ENCOUNTER — Encounter: Payer: Medicare PPO | Admitting: Family Medicine

## 2020-01-18 NOTE — Progress Notes (Signed)
Phone: [phone]   Subjective:  Patient presents today for their annual physical. Chief complaint-noted.   See problem oriented charting- ROS- full  review of systems was completed and negative  except for:  Right ankle pain, throat congestion  The following were reviewed and entered/updated in epic: Past Medical History:  Diagnosis Date  . Adenomatous polyp of colon   . Allergy    seasonal  . Arthritis   . BPH (benign prostatic hyperplasia)   . Diverticulosis 09/2010   episode of diverticulitis  . GERD (gastroesophageal reflux disease)   . Hyperlipidemia   . Hypertension   . Low back pain    Patient Active Problem List   Diagnosis Date Noted  . Chronic narcotic use 10/10/2016    Priority: High  . Arthritis of right ankle 02/11/2014    Priority: Medium  . BPH (benign prostatic hyperplasia) 12/17/2006    Priority: Medium  . Hyperlipidemia 08/19/2006    Priority: Medium  . Essential hypertension 08/19/2006    Priority: Medium  . Allergic rhinitis 02/11/2014    Priority: Low  . Family history of melanoma 02/11/2014    Priority: Low  . GERD (gastroesophageal reflux disease) 02/11/2014    Priority: Low  . Former smoker 02/11/2014    Priority: Low  . INSOMNIA, CHRONIC 07/01/2009    Priority: Low  . Actinic keratosis 12/17/2006    Priority: Low  . History of colonic polyps 08/26/2006    Priority: Low  . Hiatal hernia 01/19/2020  . Pyogenic granuloma 08/21/2019  . Mass of left finger 08/13/2019  . Arthritis of left foot 08/27/2016   Past Surgical History:  Procedure Laterality Date  . ANKLE FRACTURE SURGERY     chronic pain as result  . COLONOSCOPY    . FINGER SURGERY Left    bite by a crab  . HERNIA REPAIR     jan 2012, [facility]    Family History  Problem Relation Age of Onset  . Cancer Father        melanoma, cousin also died of it  . Cancer Brother        melanoma  . Colon polyps Sister   . Cancer Paternal Uncle        Colon cancer  . Colon  cancer Paternal Uncle   . Esophageal cancer Neg Hx   . Stomach cancer Neg Hx     Medications- reviewed and updated Current Outpatient Medications  Medication Sig Dispense Refill  . amLODipine-benazepril (LOTREL) 5-20 MG capsule TAKE ONE CAPSULE BY MOUTH DAILY 90 capsule 2  . atorvastatin (LIPITOR) 40 MG tablet TAKE ONE TABLET BY MOUTH DAILY 90 tablet 2  . Azelastine HCl 0.15 % SOLN Place 1 spray into the nose as needed.    . famotidine (PEPCID) 20 MG tablet Take 1 tablet (20 mg total) by mouth at bedtime.    . finasteride (PROSCAR) 5 MG tablet TAKE ONE TABLET BY MOUTH DAILY 90 tablet 3  . fluticasone (FLONASE) 50 MCG/ACT nasal spray SPRAY TWO SPRAYS IN EACH NOSTRIL ONCE DAILY 48 g 1  . meloxicam (MOBIC) 7.5 MG tablet Take 1 tablet (7.5 mg total) by mouth daily. 90 tablet 1  . omeprazole (PRILOSEC) 20 MG capsule Take 1 capsule 20-30 minutes prior to breakfast. 90 capsule 3   No current facility-administered medications for this visit.    Allergies-reviewed and updated Allergies  Allergen Reactions  . Dust Mite Mixed Allergen Ext [Mite (D. Farinae)]     Social History   Social  History Narrative   Married >30 years in 2016. No children. 2 dogs.       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 Oropharynx normal Neck: no thyromegaly CV: RRR no murmurs rubs or gallops Lungs: CTAB no crackles, wheeze, rhonchi Abdomen: soft/nontender/nondistended/normal bowel sounds. No rebound or guarding.  Ext: no edema Skin: warm, dry Neuro: grossly normal, moves all extremities, PERRLA   Assessment and Plan  70 y.o. male presenting for annual physical.  Health Maintenance counseling: 1. Anticipatory guidance:  Patient counseled regarding regular dental exams -q6 months, eye exams - twiceyearly,  avoiding smoking and second hand smoke , limiting alcohol to 2 beverages per day - rare social beeer.   2. Risk factor reduction:  Advised patient of need for regular exercise and diet rich and fruits and vegetables to reduce risk of heart attack and stroke. Exercise- limited by right ankle- [place] (doesn't feel as comfortable walking around [place] as he does at the beach).  Diet- loosened up around the holidays but plans to get back on his regimen of healthier eating.   Previously had weight loss but has gained several pounds back-up 5 pounds from last visit.  Still down over 20 pounds from peak Wt Readings from Last 3 Encounters:  01/19/20 199 lb 9.6 oz (90.5 kg)  12/21/19 198 lb 6.4 oz (90 kg)  09/08/19 195 lb (88.5 kg)  3. Immunizations/screenings/ancillary studies-discussed COVID-19 booster- he will contact us with date. .  Otherwise up-to-date Immunization History  Administered Date(s) Administered  . Fluad Quad(high Dose 65+) 10/14/2018  . Influenza Split 11/21/2011  . Influenza Whole 11/18/2008, 01/11/2010  . Influenza, High Dose Seasonal PF 12/20/2015, 11/19/2016, 11/19/2017  . Influenza,inj,Quad PF,6+ Mos 11/11/2012, 11/18/2013, 12/07/2014  . Influenza-Unspecified 10/26/2019  . PFIZER SARS-COV-2 Vaccination 02/20/2019, 03/17/2019  . Pneumococcal Conjugate-13 02/15/2015  . Pneumococcal Polysaccharide-23 02/16/2016  . Td 01/15/2005  . Tdap 10/10/2016  . Zoster 07/14/2012  . Zoster Recombinat (Shingrix) 05/21/2017, 08/13/2017   4. Prostate cancer screening- patient with BPH-reasonable control on finasteride.  We will continue to trend PSA. Nocturia at least once a night for most part Lab Results  Component Value Date   PSA 0.18 01/05/2019   PSA 0.19 01/02/2018   PSA 0.20 10/10/2016   5. Colon cancer screening - history of colon polyps but currently on 10-year  schedule after normal colonoscopy May 2018 with plan for 10-year repeat May 2028 6. Skin cancer screening-last year was planning to schedule dermatology visit.- seen in march. advised regular sunscreen use. Denies worrisome, changing, or new skin lesions.  7. FORMER smoker-AAA screening -2019.  Quit 1990s-no regular screening required 8. STD screening - monogamous and not needed  Status of chronic or acute concerns   #Chronic right ankle arthritis/pain-patient tried meloxicam 7.5 mg- pain not much worse than with 15 mg dose.  Failed drug screen 01/02/2018 and so narcotics have been discontinued.  We will continue to monitor renal function  #hyperlipidemia S: Medication: Atorvastatin 40Mg  Lab Results  Component Value Date   CHOL 126 01/05/2019   HDL 45.30 01/05/2019  [place] 56 01/05/2019   LDLDIRECT 90.0 02/11/2014   TRIG 122.0 01/05/2019   CHOLHDL 3 01/05/2019   A/P: Hopefully remains well controlled-update lipid panel today  # GERD/hiatal hernia S:Medication: Pepcid 20Mg  and omeprazole 20 mg. Still with back of throat congestion. No burning sensation in chest B12 levels related to PPI use: Lab Results  Component Value Date   VITAMINB12 306 07/14/2019  A/P: reasonable control- continue current meds. Would prefer to not have throat congestion issue- not 100% clear related to GERD. Hiatal hernia on EGD- offered referral back but he declines for now  - b12 normal but low normal- encouraged to consider vitamin b12 1000 mcg once a week to keep levels up  #hypertension S: medication: Lotrel 5-20Mg  Home readings #s: does not check BP Readings from Last 3 Encounters:  01/19/20 (!) 144/88  12/21/19 120/74  09/08/19 116/71  A/P: had meds before visit today- may not have had time to fully kick in= previously controlled, will do home monitoring since previously well controlled- continue current meds for now  #Family history of diabetes in grandfather-fortunately his last A1c without  increased risk  Lab Results  Component Value Date   HGBA1C 5.6 01/05/2019   # hand surgery for prior pinch in finger with hand surgery- healed well  Recommended follow up: Return in about 6 months (around 07/18/2020) for follow up- or sooner if needed. Future Appointments  Date Time Provider [place]  01/02/2021  8:45 AM [facility] COACH [facility]   Lab/Order associations: Not fasting   ICD-10-CM   1. Preventative health care  Z00.00   2. Essential hypertension  I10   3. Gastroesophageal reflux disease without esophagitis  K21.9   4. Former smoker  Z87.891   5. Hyperlipidemia, unspecified hyperlipidemia type  E78.5   6. Screening for prostate cancer  Z12.5   7. Hiatal hernia  K44.9     No orders of the defined types were placed in this encounter.   Return precautions advised.  Garret Reddish, MD

## 2020-01-18 NOTE — Patient Instructions (Addendum)
Please stop by lab before you go If you have mychart- we will send your results within 3 business days of [place] receiving them.  If you do not have mychart- we will call you about results within 5 business days of [place] receiving them.  *please also note that you will see labs on mychart as soon as they post. I will later go in and write notes on them- will say "notes from Dr. Durene Cal"  - b12 normal but low normal- encouraged to consider vitamin b12 1000 mcg once a week to keep levels up  - blood pressure just a hair high today. Has been well controlled in the past. Lets have you get a home cuff (or use wife's if she has one) and I would like to see home #s <135/85 on average at home and <140/90 in office.   Health Maintenance Due  Topic Date Due   COVID-19 Vaccine (3 - Pfizer risk 4-dose series)-please send [place] a Clinical cytogeneticist with date of September booster 04/14/2019

## 2020-01-19 ENCOUNTER — Ambulatory Visit (INDEPENDENT_AMBULATORY_CARE_PROVIDER_SITE_OTHER): Payer: Medicare PPO | Admitting: Family Medicine

## 2020-01-19 ENCOUNTER — Encounter: Payer: Self-pay | Admitting: Family Medicine

## 2020-01-19 ENCOUNTER — Other Ambulatory Visit: Payer: Self-pay

## 2020-01-19 VITALS — BP 144/88 | HR 70 | Temp 98.4°F | Ht 67.0 in | Wt 199.6 lb

## 2020-01-19 DIAGNOSIS — Z0000 Encounter for general adult medical examination without abnormal findings: Secondary | ICD-10-CM

## 2020-01-19 DIAGNOSIS — R351 Nocturia: Secondary | ICD-10-CM

## 2020-01-19 DIAGNOSIS — N401 Enlarged prostate with lower urinary tract symptoms: Secondary | ICD-10-CM

## 2020-01-19 DIAGNOSIS — Z125 Encounter for screening for malignant neoplasm of prostate: Secondary | ICD-10-CM | POA: Diagnosis not present

## 2020-01-19 DIAGNOSIS — Z87891 Personal history of nicotine dependence: Secondary | ICD-10-CM | POA: Diagnosis not present

## 2020-01-19 DIAGNOSIS — E785 Hyperlipidemia, unspecified: Secondary | ICD-10-CM

## 2020-01-19 DIAGNOSIS — K449 Diaphragmatic hernia without obstruction or gangrene: Secondary | ICD-10-CM | POA: Diagnosis not present

## 2020-01-19 DIAGNOSIS — K219 Gastro-esophageal reflux disease without esophagitis: Secondary | ICD-10-CM | POA: Diagnosis not present

## 2020-01-19 DIAGNOSIS — I10 Essential (primary) hypertension: Secondary | ICD-10-CM

## 2020-01-19 LAB — CBC WITH DIFFERENTIAL/PLATELET
Basophils Absolute: 0 10*3/uL (ref 0.0–0.1)
Basophils Relative: 0.6 % (ref 0.0–3.0)
Eosinophils Absolute: 0.2 10*3/uL (ref 0.0–0.7)
Eosinophils Relative: 1.9 % (ref 0.0–5.0)
HCT: 42.1 % (ref 39.0–52.0)
Hemoglobin: 14.4 g/dL (ref 13.0–17.0)
Lymphocytes Relative: 21.2 % (ref 12.0–46.0)
Lymphs Abs: 1.7 10*3/uL (ref 0.7–4.0)
MCHC: 34.2 g/dL (ref 30.0–36.0)
MCV: 85.5 fl (ref 78.0–100.0)
Monocytes Absolute: 0.7 10*3/uL (ref 0.1–1.0)
Monocytes Relative: 9 % (ref 3.0–12.0)
Neutro Abs: 5.4 10*3/uL (ref 1.4–7.7)
Neutrophils Relative %: 67.3 % (ref 43.0–77.0)
Platelets: 257 10*3/uL (ref 150.0–400.0)
RBC: 4.92 Mil/uL (ref 4.22–5.81)
RDW: 12.9 % (ref 11.5–15.5)
WBC: 8.1 10*3/uL (ref 4.0–10.5)

## 2020-01-19 LAB — LIPID PANEL
Cholesterol: 153 mg/dL (ref 0–200)
HDL: 59.5 mg/dL (ref >39.00)
LDL Cholesterol: 79 mg/dL (ref 0–99)
NonHDL: 93.86
Total CHOL/HDL Ratio: 3
Triglycerides: 76 mg/dL (ref 0.0–149.0)
VLDL: 15.2 mg/dL (ref 0.0–40.0)

## 2020-01-19 LAB — COMPREHENSIVE METABOLIC PANEL
ALT: 22 U/L (ref 0–53)
AST: 17 U/L (ref 0–37)
Albumin: 4.4 g/dL (ref 3.5–5.2)
Alkaline Phosphatase: 91 U/L (ref 39–117)
BUN: 16 mg/dL (ref 6–23)
CO2: 28 mEq/L (ref 19–32)
Calcium: 9.3 mg/dL (ref 8.4–10.5)
Chloride: 104 mEq/L (ref 96–112)
Creatinine, Ser: 0.83 mg/dL (ref 0.40–1.50)
GFR: 89.02 mL/min (ref >60.00)
Glucose, Bld: 94 mg/dL (ref 70–99)
Potassium: 3.9 mEq/L (ref 3.5–5.1)
Sodium: 139 mEq/L (ref 135–145)
Total Bilirubin: 0.6 mg/dL (ref 0.2–1.2)
Total Protein: 6.8 g/dL (ref 6.0–8.3)

## 2020-01-19 LAB — POC URINALSYSI DIPSTICK (AUTOMATED)
Bilirubin, UA: NEGATIVE
Blood, UA: NEGATIVE
Glucose, UA: NEGATIVE
Ketones, UA: NEGATIVE
Leukocytes, UA: NEGATIVE
Nitrite, UA: NEGATIVE
Protein, UA: NEGATIVE
Spec Grav, UA: 1.015 (ref 1.010–1.025)
Urobilinogen, UA: 0.2 E.U./dL
pH, UA: 6 (ref 5.0–8.0)

## 2020-01-19 LAB — PSA: PSA: 0.21 ng/mL (ref 0.10–4.00)

## 2020-01-19 NOTE — Addendum Note (Signed)
Addended by: Cleda Mccreedy F on: 01/19/2020 11:15 AM   Modules accepted: Orders

## 2020-02-18 ENCOUNTER — Encounter: Payer: Self-pay | Admitting: Family Medicine

## 2020-02-22 ENCOUNTER — Encounter: Payer: Self-pay | Admitting: Family Medicine

## 2020-02-22 ENCOUNTER — Other Ambulatory Visit: Payer: Self-pay

## 2020-02-22 DIAGNOSIS — M545 Low back pain, unspecified: Secondary | ICD-10-CM

## 2020-02-23 ENCOUNTER — Ambulatory Visit (INDEPENDENT_AMBULATORY_CARE_PROVIDER_SITE_OTHER): Payer: Medicare PPO

## 2020-02-23 ENCOUNTER — Other Ambulatory Visit: Payer: Self-pay

## 2020-02-23 ENCOUNTER — Other Ambulatory Visit: Payer: Self-pay | Admitting: Family Medicine

## 2020-02-23 ENCOUNTER — Ambulatory Visit (INDEPENDENT_AMBULATORY_CARE_PROVIDER_SITE_OTHER): Payer: Medicare PPO | Admitting: Family Medicine

## 2020-02-23 ENCOUNTER — Encounter: Payer: Self-pay | Admitting: Family Medicine

## 2020-02-23 DIAGNOSIS — M545 Low back pain, unspecified: Secondary | ICD-10-CM

## 2020-02-23 MED ORDER — BACLOFEN 10 MG PO TABS
5.0000 mg | ORAL_TABLET | Freq: Three times a day (TID) | ORAL | 3 refills | Status: DC | PRN
Start: 2020-02-23 — End: 2020-05-11

## 2020-02-23 MED ORDER — VITAMIN D-3 125 MCG (5000 UT) PO TABS: 1.0000 | ORAL_TABLET | Freq: Every day | ORAL | 3 refills | Status: DC

## 2020-02-23 NOTE — Progress Notes (Signed)
Office Visit Note   Patient: Matthew Watson           Date of Birth: [birth year]           MRN: [id number] Visit Date: 02/23/2020 Requested by: Marin Olp, MD [place],  [place] [id number] PCP: Marin Olp, MD  Subjective: Chief Complaint  Patient presents with  . Lower Back - Pain    Left lower back pain, into the buttock, post fall on 02/02/20 - slipped on the ice in a parking lot and landed on his buttocks. No numbness/tingling in the legs/feet. The left side of his back throbs when lying down at night.     HPI: He is here with low back pain.  On January 18 he fell in a church parking lot, landing on his buttocks.  He got up and felt a little bit of soreness but not much.  Then a week later he fell again and this time he started feeling a lot of pain in his left lower back.  It hurts when lying down at night, aches and throbs.  No radicular symptoms, no previous problems with his back other than scoliosis when he was younger.  He is not taking anything for his pain.              ROS:   All other systems were reviewed and are negative.  Objective: Vital Signs: There were no vitals taken for this visit.  Physical Exam:  General:  Alert and oriented, in no acute distress. Pulm:  Breathing unlabored. Psy:  Normal mood, congruent affect. Skin: No bruising Low back: He is tender to the left of midline at the L5-S1 level.  No significant spinous process tenderness.  Mild tenderness in the left sciatic notch.  Straight leg raise negative, no pain with internal hip rotation.  Lower extremity strength and reflexes are normal.  Imaging: XR Lumbar Spine 2-3 Views  Result Date: 02/23/2020 X-rays lumbar spine reveal scoliosis with diffuse degenerative disc disease and facet arthropathy.  I question whether he might have a compression deformity at L3, but it is not definite.  No sign of neoplasm.   Assessment & Plan: 1.  Almost 3 weeks status post fall with  low back pain, cannot rule out compression fracture.  Neurologic exam is nonfocal. -We will try baclofen as needed, empirically treat with vitamin D3.  Referral to physical therapy.  If pain does not improve, he will contact me and I will order MRI scan lumbar spine.     Procedures: No procedures performed        PMFS History: Patient Active Problem List   Diagnosis Date Noted  . Hiatal hernia 01/19/2020  . Pyogenic granuloma 08/21/2019  . Mass of left finger 08/13/2019  . Chronic narcotic use 10/10/2016  . Arthritis of left foot 08/27/2016  . Allergic rhinitis 02/11/2014  . Arthritis of right ankle 02/11/2014  . Family history of melanoma 02/11/2014  . GERD (gastroesophageal reflux disease) 02/11/2014  . Former smoker 02/11/2014  . INSOMNIA, CHRONIC 07/01/2009  . BPH (benign prostatic hyperplasia) 12/17/2006  . Actinic keratosis 12/17/2006  . History of colonic polyps 08/26/2006  . Hyperlipidemia 08/19/2006  . Essential hypertension 08/19/2006   Past Medical History:  Diagnosis Date  . Adenomatous polyp of colon   . Allergy    seasonal  . Arthritis   . BPH (benign prostatic hyperplasia)   . Diverticulosis 09/2010   episode of diverticulitis  . GERD (  gastroesophageal reflux disease)   . Hyperlipidemia   . Hypertension   . Low back pain     Family History  Problem Relation Age of Onset  . Cancer Father        melanoma, cousin also died of it  . Cancer Brother        melanoma  . Colon polyps Sister   . Cancer Paternal Uncle        Colon cancer  . Colon cancer Paternal Uncle   . Esophageal cancer Neg Hx   . Stomach cancer Neg Hx     Past Surgical History:  Procedure Laterality Date  . ANKLE FRACTURE SURGERY     chronic pain as result  . COLONOSCOPY    . FINGER SURGERY Left    bite by a crab  . HERNIA REPAIR     jan 2012, [facility]   Social History   Occupational History  . Occupation: retired  Tobacco Use  . Smoking status: Former Smoker     Packs/day: 0.25    Years: 10.00    Pack years: 2.50    Types: Cigarettes    Quit date: 03/23/1990    Years since quitting: 29.9  . Smokeless tobacco: Never Used  . Tobacco comment: quit in 1992  Vaping Use  . Vaping Use: Never used  Substance and Sexual Activity  . Alcohol use: Yes    Alcohol/week: 6.0 standard drinks    Types: 6 Cans of beer per week  . Drug use: No  . Sexual activity: Yes

## 2020-02-24 ENCOUNTER — Other Ambulatory Visit: Payer: Self-pay | Admitting: Family Medicine

## 2020-02-24 MED ORDER — VITAMIN D3 MAXIMUM STRENGTH 125 MCG (5000 UT) PO CAPS: 5000.0000 [IU] | ORAL_CAPSULE | Freq: Every day | ORAL | 3 refills | Status: AC

## 2020-03-01 ENCOUNTER — Encounter: Payer: Self-pay | Admitting: Physical Therapy

## 2020-03-01 ENCOUNTER — Ambulatory Visit: Payer: Medicare PPO | Admitting: Physical Therapy

## 2020-03-01 ENCOUNTER — Other Ambulatory Visit: Payer: Self-pay

## 2020-03-01 DIAGNOSIS — M6281 Muscle weakness (generalized): Secondary | ICD-10-CM | POA: Diagnosis not present

## 2020-03-01 DIAGNOSIS — R262 Difficulty in walking, not elsewhere classified: Secondary | ICD-10-CM

## 2020-03-01 DIAGNOSIS — M545 Low back pain, unspecified: Secondary | ICD-10-CM | POA: Diagnosis not present

## 2020-03-01 NOTE — Patient Instructions (Signed)
Posterior pelvic tilt  -Start by lying supine on the table. -Knees bent and feet flat down. -Push pelvis into the table; like you're trying to crush someone's hand. -Roll pelvis posteriorly.  -Condition and Functionality: Helps with spinal alignment and low back health. -Muscles: Core, Glutes, Biceps femoris.  Repeat 10 Times Hold 3 Seconds Complete 1 Set Perform 2 Times a Day  LOWER TRUNK ROTATIONS - LTR - WIG WAGS - KNEE ROCKS  Lying on your back with your knees bent, gently rotate your spine as you move your knees to the side and then reverse directions and move your knees to the other side. Repeat as you move through a comfortable range of motion.  Video # [id number]  Repeat 10 Times Hold 1 Second Complete 1 Set Perform 2 Times a Day  SEATED HAMSTRING STRETCH  While seated, rest your heel on the floor with your knee straight and gently lean forward until a stretch is felt behind your knee/thigh. The stretch should be gentle with no pain.  Video # [id number]  Repeat 3 Times Hold 30 Seconds Complete 1 Set Perform 2 Times a Day

## 2020-03-01 NOTE — Therapy (Signed)
[facility] [address] [place], [place], [address] Phone: [phone]   Fax:  [phone]  Physical Therapy Evaluation  Patient Details  Name: Matthew Watson MRN: [id number] Date of Birth: May 10, 1950 Referring Provider (PT): Dr. Junius Roads   Encounter Date: 03/01/2020   PT End of Session - 03/01/20 1041    Visit Number 1    Number of Visits 17    Date for PT Re-Evaluation 05/30/20    Authorization Type [employer]    PT Start Time 0935    PT Stop Time 1020    PT Time Calculation (min) 45 min    Activity Tolerance Patient tolerated treatment well    Behavior During Therapy [facility] for tasks assessed/performed           Past Medical History:  Diagnosis Date  . Adenomatous polyp of colon   . Allergy    seasonal  . Arthritis   . BPH (benign prostatic hyperplasia)   . Diverticulosis 09/2010   episode of diverticulitis  . GERD (gastroesophageal reflux disease)   . Hyperlipidemia   . Hypertension   . Low back pain     Past Surgical History:  Procedure Laterality Date  . ANKLE FRACTURE SURGERY     chronic pain as result  . COLONOSCOPY    . FINGER SURGERY Left    bite by a crab  . HERNIA REPAIR     jan 2012, [place]    There were no vitals filed for this visit.    Subjective Assessment - 03/01/20 0940    Subjective Pt states he fell twice in January due to the ice in the parking lot of church and in the grass at a friend's house. After those two falls, it started hurting into his back. He got muscle relaxers from a Sports Med MD. He states he feels it mostly on the L side. He feels it the mostly after sitting still or driving for a while. Pt states his balance is really off due to his previous ankle fx and surgery. He feels that "he is 80ft tall and top heavy." He states the "compacted fx" is older. He states it does hurt when he coughs or sneezes.Pt stats he sleeps in recliner due to hx of acid reflux and hernia. Pt states  that he did have NT a week ago but nothing the last few days. He denies radiating pain currently. Pt denies red flag questions.    Limitations Walking;Lifting;House hold activities    How long can you stand comfortably? 1 hour    How long can you walk comfortably? 15-20    Diagnostic tests X-ray    Currently in Pain? Yes    Pain Score 3     Pain Location Back    Pain Orientation Left    Pain Descriptors / Indicators Aching;Sharp    Pain Type Acute pain    Pain Radiating Towards stops at knees now    Pain Onset More than a month ago    Pain Frequency Intermittent    Aggravating Factors  walking too long, lifting, bending, standing back up    Pain Relieving Factors sitting down              [facility] PT Assessment - 03/01/20 0001      Assessment   Medical Diagnosis LBP    Referring Provider (PT) Dr. Junius Roads    Onset Date/Surgical Date 02/02/20    Prior Therapy N/A  Precautions   Precautions None      Restrictions   Weight Bearing Restrictions No      Balance Screen   Has the patient fallen in the past 6 months Yes    How many times? 2    Has the patient had a decrease in activity level because of a fear of falling?  No    Is the patient reluctant to leave their home because of a fear of falling?  No      Home Ecologist residence    Living Arrangements Spouse/significant other    Type of [place]      Prior Function   Level of [place] Retired;Works at home      Cognition   Overall Cognitive Status Within Functional Limits for tasks assessed      Functional Tests   Functional tests Step up;Sit to Stand      Step Up   Comments bilat UE support, step to pattern descending, R foot down descending, reciprocal ascending      Sit to Stand   Comments 5XSTS 10.82 s      ROM / Strength   AROM / PROM / Strength AROM;PROM;Strength      AROM   AROM Assessment Site Lumbar    Lumbar Flexion 40%   Gower's  sign on return   Lumbar Extension 20%   p! and end range   Lumbar - Right Side Bend 50%   stretch on L   Lumbar - Left Side Bend 50%   p!   Lumbar - Right Rotation 75%    Lumbar - Left Rotation 75%      Strength   Strength Assessment Site Hip;Knee    Right/Left Hip Right;Left    Right Hip Flexion 4/5    Right Hip Extension 4-/5    Right Hip ABduction 4-/5    Right Hip ADduction 4+/5    Left Hip Flexion 4-/5   p!   Left Hip Extension 4-/5    Left Hip ABduction 4-/5    Left Hip ADduction 4+/5      Flexibility   Soft Tissue Assessment /Muscle Length yes    Hamstrings 90/90 >35 deg deficit    Quadriceps mod limited    Piriformis significant limit    Quadratus Lumborum significant limit      Palpation   Spinal mobility hypomobile L1/5 PA    SI assessment  TTP around PSIS    Palpation comment TTP L/S para, QL glutes, deep rotators bilat      Special Tests    Special Tests Lumbar;Hip Special Tests    Lumbar Tests Straight Leg Raise    Hip Special Tests  Trendelenberg Test;SI Compression;SI Distraction;Hip Scouring      Straight Leg Raise   Findings Negative      Trendelenburg Test   Findings Positive    Side Right;Left      SI Compression   Findings Positive    Side Left      SI Distraction   Findings  Negative      Hip Scouring   Findings Negative      Transfers   Five time sit to stand comments  UE support needed      Ambulation/Gait   Gait Pattern Decreased stance time - left;Decreased stride length;Decreased hip/knee flexion - right;Decreased hip/knee flexion - left;Decreased weight shift to left;Left foot flat;Right foot flat;Lateral trunk lean to right  Ambulation Surface Indoor      Standardized Balance Assessment   Standardized Balance Assessment Timed Up and Go Test   11.1s     Timed Up and Go Test   Normal TUG (seconds) 11.1                      Objective measurements completed on examination: See above findings.       [place]  Adult PT Treatment/Exercise - 03/01/20 1030      Transfers   Five time sit to stand comments  UE support needed      Ambulation/Gait   Gait Pattern Decreased stance time - left;Decreased stride length;Decreased hip/knee flexion - right;Decreased hip/knee flexion - left;Decreased weight shift to left;Left foot flat;Right foot flat;Lateral trunk lean to right      Exercises   Exercises Lumbar;Knee/Hip;Other Exercises      Lumbar Exercises: Stretches   Passive Hamstring Stretch Right;Left;3 reps;30 seconds    Passive Hamstring Stretch Limitations Seated    Pelvic Tilt 10 reps;5 seconds    Other Lumbar Stretch Exercise LTR 3s 10x each                  PT Education - 03/01/20 1040    Education Details POC, HEP, dx/prog, movement variability, MOI, anatomy    Person(s) Educated Patient    Methods Explanation;Handout    Comprehension Verbalized understanding            PT Short Term Goals - 03/01/20 1055      PT SHORT TERM GOAL #1   Title Pt will become independent with HEP.    Time 2    Period Weeks    Status New    Target Date 03/15/20      PT SHORT TERM GOAL #2   Title Pt will demonstrate ability to perform STS without bilat UE support    Time 4    Period Weeks    Status New    Target Date 03/29/20      PT SHORT TERM GOAL #3   Title Pt will self report decrease of back pain and ability to lift >25 lbs in order to demonstrate functional improvement in L/S stability.    Time 4    Period Weeks    Status New    Target Date 03/29/20             PT Long Term Goals - 03/01/20 1056      PT LONG TERM GOAL #1   Title Pt will demonstrate TUG score of under 10s in order to demonstrate functional improvement in balance and gait speed.    Time 6    Period Weeks    Status New    Target Date 04/12/20      PT LONG TERM GOAL #2   Title Pt will demonstrates 5XSTS without UE support in under 10s to demonstrate functional improvement in LE strength and back pain.     Time 8    Period Weeks    Status New    Target Date 04/26/20                  Plan - 03/01/20 1043    Clinical Impression Statement Pt is a 70 y.o. male presenting to PT eval today for cc of L sided LBP. Pt demonstrates decreased L/S ROM, decreased hip ROM, gait deviation, balance deficits, and L LE strength deficits. Pt's s/s are consistent with acute L/S and SIJ pain  with distal referral pattern following traumatic fall event. Clinical testing and subjective report do not suggest internal derangement at this time, but skilled therapy wil continue to assess as subsequent session. Due to pt's history of ankle fx and surgery, pt's gait and balance are impaired which decreases his ability to self right when COG is outside of BOS. Pt's current impairments limit his full participation in ADL, house hold duties, and physical fitness. Pt would benefit from continued skilled therapy in order to address functional mobility, strength, and balance deficits in order to maximize safety for full return to community mobility participation.    Personal Factors and Comorbidities Age;Comorbidity 1;Fitness    Examination-Activity Limitations Lift;Bend;Carry;Sleep;Squat;Stairs    Examination-Participation Restrictions Driving;Yard Work;Other;Shop    Stability/Clinical Decision Making Evolving/Moderate complexity    Clinical Decision Making Moderate    Rehab Potential Fair    PT Frequency 2x / week    PT Duration 8 weeks    PT Treatment/Interventions Aquatic Therapy;Cryotherapy;Electrical Stimulation;Moist Heat;Traction;Gait training;Stair training;Functional mobility training;Therapeutic activities;Therapeutic exercise;Neuromuscular re-education;Balance training;Patient/family education;Manual techniques;Passive range of motion;Dry needling;Energy conservation;Joint Manipulations;Spinal Manipulations    PT Next Visit Plan Review HEP, bike warm up, trial bridge, clamshell, piriformis stretch, SKTC    PT Home  Exercise Plan HEP[employer] printed handout to pt    Consulted and Agree with Plan of Care Patient           Patient will benefit from skilled therapeutic intervention in order to improve the following deficits and impairments:  Abnormal gait,Decreased balance,Decreased mobility,Difficulty walking,Increased muscle spasms,Decreased range of motion,Decreased activity tolerance,Decreased strength,Impaired flexibility,Pain  Visit Diagnosis: Left low back pain, unspecified chronicity, unspecified whether sciatica present  Muscle weakness (generalized)  Difficulty walking     Problem List Patient Active Problem List   Diagnosis Date Noted  . Hiatal hernia 01/19/2020  . Pyogenic granuloma 08/21/2019  . Mass of left finger 08/13/2019  . Chronic narcotic use 10/10/2016  . Arthritis of left foot 08/27/2016  . Allergic rhinitis 02/11/2014  . Arthritis of right ankle 02/11/2014  . Family history of melanoma 02/11/2014  . GERD (gastroesophageal reflux disease) 02/11/2014  . Former smoker 02/11/2014  . INSOMNIA, CHRONIC 07/01/2009  . BPH (benign prostatic hyperplasia) 12/17/2006  . Actinic keratosis 12/17/2006  . History of colonic polyps 08/26/2006  . Hyperlipidemia 08/19/2006  . Essential hypertension 08/19/2006    Daleen Bo PT, DPT 03/01/20 11:02 AM    [facility] [address] [place], [place], [address] Phone: [phone]   Fax:  [phone]  Name: Matthew Watson MRN: [id number] Date of Birth: Nov 10, 1950

## 2020-03-07 ENCOUNTER — Ambulatory Visit: Payer: Medicare PPO | Admitting: Physical Therapy

## 2020-03-07 ENCOUNTER — Other Ambulatory Visit: Payer: Self-pay

## 2020-03-07 ENCOUNTER — Encounter: Payer: Self-pay | Admitting: Physical Therapy

## 2020-03-07 ENCOUNTER — Encounter: Payer: Medicare PPO | Admitting: Physical Therapy

## 2020-03-07 DIAGNOSIS — R262 Difficulty in walking, not elsewhere classified: Secondary | ICD-10-CM

## 2020-03-07 DIAGNOSIS — M545 Low back pain, unspecified: Secondary | ICD-10-CM | POA: Diagnosis not present

## 2020-03-07 DIAGNOSIS — M6281 Muscle weakness (generalized): Secondary | ICD-10-CM

## 2020-03-07 NOTE — Patient Instructions (Signed)
Access Code: [email or web address] URL: [email or web address] Date: 03/07/2020 Prepared by: Daleen Bo  Exercises Supine Bridge - 2 x daily - 7 x weekly - 2 sets - 10 reps Seated Quadratus Lumborum Stretch with Arm Overhead - 2 x daily - 7 x weekly - 1 sets - 3 reps - 30 hold

## 2020-03-07 NOTE — Therapy (Signed)
[facility] [address] [place], [place], [address] Phone: [phone]   Fax:  [phone]  Physical Therapy Treatment  Patient Details  Name: Matthew Watson MRN: [id number] Date of Birth: 1950-10-06 Referring Provider (PT): Dr. Junius Roads   Encounter Date: 03/07/2020   PT End of Session - 03/07/20 1550    Visit Number 2    Number of Visits 17    Date for PT Re-Evaluation 05/30/20    Authorization Type [employer]    PT Start Time 4403    PT Stop Time 1557    PT Time Calculation (min) 42 min    Activity Tolerance Patient tolerated treatment well    Behavior During Therapy [facility] for tasks assessed/performed           Past Medical History:  Diagnosis Date  . Adenomatous polyp of colon   . Allergy    seasonal  . Arthritis   . BPH (benign prostatic hyperplasia)   . Diverticulosis 09/2010   episode of diverticulitis  . GERD (gastroesophageal reflux disease)   . Hyperlipidemia   . Hypertension   . Low back pain     Past Surgical History:  Procedure Laterality Date  . ANKLE FRACTURE SURGERY     chronic pain as result  . COLONOSCOPY    . FINGER SURGERY Left    bite by a crab  . HERNIA REPAIR     jan 2012, [place]    There were no vitals filed for this visit.   Subjective Assessment - 03/07/20 1517    Subjective Pt states the back is much better. Pt states he has had decreased intensity of pain and decreased NT into the LE. Pt states that the bending over and standing back up still is the worst pain.    Limitations Walking;Lifting;House hold activities    How long can you stand comfortably? 1 hour    How long can you walk comfortably? 15-20    Currently in Pain? Yes    Pain Score 4     Pain Location Back    Pain Orientation Left    Pain Descriptors / Indicators Aching;Sharp    Pain Frequency Intermittent    Multiple Pain Sites No                             [facility] Adult PT Treatment/Exercise -  03/07/20 0001      Exercises   Exercises Lumbar;Knee/Hip;Other Exercises      Lumbar Exercises: Stretches   Passive Hamstring Stretch Right;Left;3 reps;30 seconds    Passive Hamstring Stretch Limitations Seated    Single Knee to Chest Stretch 5 reps;20 seconds    Lower Trunk Rotation 10 seconds    Lower Trunk Rotation Limitations 10x    Pelvic Tilt 10 reps;5 seconds    Other Lumbar Stretch Exercise LTR 3s 10x each    Other Lumbar Stretch Exercise seated QL stretch 30s 3x      Lumbar Exercises: Supine   Bridge 20 reps      Manual Therapy   Manual Therapy Joint mobilization;Soft tissue mobilization    Joint Mobilization S/L PA grade II-III L1-5    Soft tissue mobilization bilat lumbar paraspinals L>R ischemic pressure                  PT Education - 03/07/20 1548    Education Details HEP, movement variability, anatomy    Person(s) Educated Patient  Methods Explanation;Handout;Demonstration    Comprehension Verbalized understanding;Returned demonstration            PT Short Term Goals - 03/01/20 1055      PT SHORT TERM GOAL #1   Title Pt will become independent with HEP without cuing or edu regarding compliance.    Time 2    Period Weeks    Status New    Target Date 03/15/20      PT SHORT TERM GOAL #2   Title Pt will demonstrate ability to perform STS without bilat UE support in order to demonstrates improvement in lumbopelvic stability and LE strength    Time 4    Period Weeks    Status New    Target Date 03/29/20      PT SHORT TERM GOAL #3   Title Pt will self report decrease of back pain and ability to lift >25 lbs in order to demonstrate functional improvement in L/S stability.    Time 4    Period Weeks    Status New    Target Date 03/29/20             PT Long Term Goals - 03/01/20 1056      PT LONG TERM GOAL #1   Title Pt will demonstrate TUG score of under 10s in order to demonstrate functional improvement in balance and gait speed.     Time 6    Period Weeks    Status New    Target Date 04/12/20      PT LONG TERM GOAL #2   Title Pt will demonstrates 5XSTS without UE support in under 10s to demonstrate functional improvement in LE strength and back pain.    Time 8    Period Weeks    Status New    Target Date 04/26/20                 Plan - 03/07/20 1551    Clinical Impression Statement Pt was able to tolerate progressed stretching program and strengthening exercise at today's session. Pt had increased spasm of L lumbar paraspinals and QL which limited L SB, R rotation, and forward flexion. Pt required increased VC for diaphragmatic breathing during exercise. With bridging, pt required cuing for glute activation and PPT in order to reduce occurence of sharp back pain. Pt is more stiff dominant at this stage. Pt would benefit from continued skilled therapy in order to address functional mobility, strength, and balance deficits in order to maximize safety for full return to community mobility participation.    Personal Factors and Comorbidities Age;Comorbidity 1;Fitness    Examination-Activity Limitations Lift;Bend;Carry;Sleep;Squat;Stairs    Examination-Participation Restrictions Driving;Yard Work;Other;Shop    Stability/Clinical Decision Making Evolving/Moderate complexity    Clinical Decision Making Moderate    Rehab Potential Fair    PT Frequency 2x / week    PT Duration 8 weeks    PT Treatment/Interventions Aquatic Therapy;Cryotherapy;Electrical Stimulation;Moist Heat;Traction;Gait training;Stair training;Functional mobility training;Therapeutic activities;Therapeutic exercise;Neuromuscular re-education;Balance training;Patient/family education;Manual techniques;Passive range of motion;Dry needling;Energy conservation;Joint Manipulations;Spinal Manipulations    PT Next Visit Plan Review HEP, bike warm up, clamshell, piriformis stretch, STM paraspinals    Consulted and Agree with Plan of Care Patient            Patient will benefit from skilled therapeutic intervention in order to improve the following deficits and impairments:  Abnormal gait,Decreased balance,Decreased mobility,Difficulty walking,Increased muscle spasms,Decreased range of motion,Decreased activity tolerance,Decreased strength,Impaired flexibility,Pain  Visit Diagnosis: Left low back pain, unspecified chronicity,  unspecified whether sciatica present  Muscle weakness (generalized)  Difficulty walking     Problem List Patient Active Problem List   Diagnosis Date Noted  . Hiatal hernia 01/19/2020  . Pyogenic granuloma 08/21/2019  . Mass of left finger 08/13/2019  . Chronic narcotic use 10/10/2016  . Arthritis of left foot 08/27/2016  . Allergic rhinitis 02/11/2014  . Arthritis of right ankle 02/11/2014  . Family history of melanoma 02/11/2014  . GERD (gastroesophageal reflux disease) 02/11/2014  . Former smoker 02/11/2014  . INSOMNIA, CHRONIC 07/01/2009  . BPH (benign prostatic hyperplasia) 12/17/2006  . Actinic keratosis 12/17/2006  . History of colonic polyps 08/26/2006  . Hyperlipidemia 08/19/2006  . Essential hypertension 08/19/2006    Daleen Bo PT, DPT 03/07/20 4:06 PM   [facility] [address] [place], [place], [address] Phone: [phone]   Fax:  [phone]  Name: Matthew Watson MRN: [id number] Date of Birth: 11-06-1950

## 2020-03-08 ENCOUNTER — Ambulatory Visit: Payer: Medicare PPO | Admitting: Family Medicine

## 2020-03-09 ENCOUNTER — Ambulatory Visit: Payer: Medicare PPO | Admitting: Physical Therapy

## 2020-03-09 ENCOUNTER — Encounter: Payer: Self-pay | Admitting: Physical Therapy

## 2020-03-09 ENCOUNTER — Other Ambulatory Visit: Payer: Self-pay

## 2020-03-09 DIAGNOSIS — R262 Difficulty in walking, not elsewhere classified: Secondary | ICD-10-CM

## 2020-03-09 DIAGNOSIS — M6281 Muscle weakness (generalized): Secondary | ICD-10-CM

## 2020-03-09 DIAGNOSIS — M545 Low back pain, unspecified: Secondary | ICD-10-CM

## 2020-03-09 NOTE — Therapy (Signed)
[facility] [address] [place], [place], [address] Phone: [phone]   Fax:  [phone]  Physical Therapy Treatment  Patient Details  Name: Matthew Watson MRN: [id number] Date of Birth: 1950/03/02 Referring Provider (PT): Dr. Junius Roads   Encounter Date: 03/09/2020   PT End of Session - 03/09/20 0846    Visit Number 3    Number of Visits 17    Date for PT Re-Evaluation 05/30/20    Authorization Type [employer]    PT Start Time 0805    PT Stop Time 0850    PT Time Calculation (min) 45 min    Activity Tolerance Patient tolerated treatment well;No increased pain    Behavior During Therapy WFL for tasks assessed/performed           Past Medical History:  Diagnosis Date  . Adenomatous polyp of colon   . Allergy    seasonal  . Arthritis   . BPH (benign prostatic hyperplasia)   . Diverticulosis 09/2010   episode of diverticulitis  . GERD (gastroesophageal reflux disease)   . Hyperlipidemia   . Hypertension   . Low back pain     Past Surgical History:  Procedure Laterality Date  . ANKLE FRACTURE SURGERY     chronic pain as result  . COLONOSCOPY    . FINGER SURGERY Left    bite by a crab  . HERNIA REPAIR     jan 2012, [place]    There were no vitals filed for this visit.   Subjective Assessment - 03/09/20 0810    Subjective Pt states the back is better. 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 Pt states that the NT has also almost resolved.    Limitations Walking;Lifting;House hold activities    How long can you stand comfortably? 1 hour    How long can you walk comfortably? 15-20    Diagnostic tests X-ray    Pain Score 2     Pain Location Back    Pain Onset More than a month ago                             [facility] Adult PT Treatment/Exercise - 03/09/20 0827      Exercises   Exercises Lumbar;Knee/Hip;Other Exercises       Lumbar Exercises: Stretches   Passive Hamstring Stretch Right;Left;3 reps;30 seconds    Passive Hamstring Stretch Limitations Seated    Lower Trunk Rotation 10 seconds    Lower Trunk Rotation Limitations 10x    Pelvic Tilt 10 reps;5 seconds    Figure 4 Stretch 3 reps;30 seconds    Other Lumbar Stretch Exercise seated QL stretch 30s 3x      Lumbar Exercises: Standing   Functional Squats 20 reps    Functional Squats Limitations 2x10 STS      Lumbar Exercises: Supine   Bridge 20 reps      Manual Therapy   Manual Therapy Joint mobilization;Soft tissue mobilization    Joint Mobilization prone PA grade II-III L1-5    Soft tissue mobilization bilat lumbar paraspinals L>R ischemic pressure                  PT Education - 03/09/20 0836    Education Details HEP update, pain free ROM, stretching frequency    Person(s) Educated Patient    Methods Explanation;Demonstration;Handout  Comprehension Verbalized understanding;Returned demonstration            PT Short Term Goals - 03/01/20 1055      PT SHORT TERM GOAL #1   Title Pt will become independent with HEP without cuing or edu regarding compliance.    Time 2    Period Weeks    Status New    Target Date 03/15/20      PT SHORT TERM GOAL #2   Title Pt will demonstrate ability to perform STS without bilat UE support in order to demonstrates improvement in lumbopelvic stability and LE strength    Time 4    Period Weeks    Status New    Target Date 03/29/20      PT SHORT TERM GOAL #3   Title Pt will self report decrease of back pain and ability to lift >25 lbs in order to demonstrate functional improvement in L/S stability.    Time 4    Period Weeks    Status New    Target Date 03/29/20             PT Long Term Goals - 03/01/20 1056      PT LONG TERM GOAL #1   Title Pt will demonstrate TUG score of under 10s in order to demonstrate functional improvement in balance and gait speed.    Time 6    Period Weeks     Status New    Target Date 04/12/20      PT LONG TERM GOAL #2   Title Pt will demonstrates 5XSTS without UE support in under 10s to demonstrate functional improvement in LE strength and back pain.    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 Pt had increased soft tissue extensibility into bilateral paraspinals after STM. Pt found the strongest stretch with figure 4 in supine on the R hip. Pt required sustained repetitions in order to increase flexiblity and hip mobility. Pt was able to progress to STS strengthening today but required increased VC and TC for hip extension and slow eccentric lowering.Pt would benefit from continued skilled therapy in order to address functional mobility, strength, and balance deficits in order to maximize safety for full return to community mobility participation.    Personal Factors and Comorbidities Age;Comorbidity 1;Fitness    Examination-Activity Limitations Lift;Bend;Carry;Sleep;Squat;Stairs    Examination-Participation Restrictions Driving;Yard Work;Other;Shop    Stability/Clinical Decision Making Evolving/Moderate complexity    Rehab Potential Fair    PT Frequency 2x / week    PT Duration 8 weeks    PT Treatment/Interventions Aquatic Therapy;Cryotherapy;Electrical Stimulation;Moist Heat;Traction;Gait training;Stair training;Functional mobility training;Therapeutic activities;Therapeutic exercise;Neuromuscular re-education;Balance training;Patient/family education;Manual techniques;Passive range of motion;Dry needling;Energy conservation;Joint Manipulations;Spinal Manipulations    PT Next Visit Plan Review HEP, clamshell, forward flexion stretch L and R, seated pelvic tilt    PT Home Exercise Plan  HEP2GO printed handout to pt    Consulted and Agree with Plan of Care Patient           Patient will benefit from skilled therapeutic intervention in order to improve the following deficits and impairments:  Abnormal gait,Decreased balance,Decreased mobility,Difficulty walking,Increased muscle spasms,Decreased  range of motion,Decreased activity tolerance,Decreased strength,Impaired flexibility,Pain  Visit Diagnosis: Left low back pain, unspecified chronicity, unspecified whether sciatica present  Muscle weakness (generalized)  Difficulty walking     Problem List Patient Active Problem List   Diagnosis Date Noted  . Hiatal hernia 01/19/2020  . Pyogenic granuloma 08/21/2019  . Mass of left finger 08/13/2019  . Chronic narcotic use 10/10/2016  . Arthritis of left foot 08/27/2016  . Allergic rhinitis 02/11/2014  . Arthritis of right ankle 02/11/2014  . Family history of melanoma 02/11/2014  . GERD (gastroesophageal reflux disease) 02/11/2014  . Former smoker 02/11/2014  . INSOMNIA, CHRONIC 07/01/2009  . BPH (benign prostatic hyperplasia) 12/17/2006  . Actinic keratosis 12/17/2006  . History of colonic polyps 08/26/2006  . Hyperlipidemia 08/19/2006  . Essential hypertension 08/19/2006    Daleen Bo PT, DPT 03/09/20 8:56 AM   [facility] [address] [place], [place], [address] Phone: [phone]   Fax:  [phone]  Name: Matthew Watson MRN: [id number] Date of Birth: 1950-12-28

## 2020-03-09 NOTE — Patient Instructions (Signed)
Access Code: [email or web address] URL: [email or web address] Date: 03/09/2020 Prepared by: Daleen Bo  Exercises Supine Figure 4 Piriformis Stretch - 2 x daily - 7 x weekly - 1 sets - 3 reps Squat with Chair Touch - 1 x daily - 7 x weekly - 2 sets - 10 reps

## 2020-03-14 ENCOUNTER — Other Ambulatory Visit: Payer: Self-pay | Admitting: Family Medicine

## 2020-03-14 ENCOUNTER — Encounter: Payer: Medicare PPO | Admitting: Physical Therapy

## 2020-03-16 ENCOUNTER — Other Ambulatory Visit: Payer: Self-pay

## 2020-03-16 ENCOUNTER — Ambulatory Visit: Payer: Medicare PPO | Admitting: Physical Therapy

## 2020-03-16 ENCOUNTER — Encounter: Payer: Self-pay | Admitting: Physical Therapy

## 2020-03-16 DIAGNOSIS — M6281 Muscle weakness (generalized): Secondary | ICD-10-CM

## 2020-03-16 DIAGNOSIS — M545 Low back pain, unspecified: Secondary | ICD-10-CM | POA: Diagnosis not present

## 2020-03-16 DIAGNOSIS — R262 Difficulty in walking, not elsewhere classified: Secondary | ICD-10-CM

## 2020-03-16 NOTE — Therapy (Signed)
[facility] [address] [place], [place], [address] Phone: [phone]   Fax:  [phone]  Physical Therapy Treatment  Patient Details  Name: Matthew Watson MRN: [id number] Date of Birth: Oct 10, 1950 Referring Provider (PT): Dr. Junius Roads   Encounter Date: 03/16/2020   PT End of Session - 03/16/20 0859    Visit Number 4    Number of Visits 17    Date for PT Re-Evaluation 05/30/20    Authorization Type [employer]    PT Start Time 0804    PT Stop Time 0845    PT Time Calculation (min) 41 min    Activity Tolerance Patient tolerated treatment well;No increased pain    Behavior During Therapy WFL for tasks assessed/performed           Past Medical History:  Diagnosis Date  . Adenomatous polyp of colon   . Allergy    seasonal  . Arthritis   . BPH (benign prostatic hyperplasia)   . Diverticulosis 09/2010   episode of diverticulitis  . GERD (gastroesophageal reflux disease)   . Hyperlipidemia   . Hypertension   . Low back pain     Past Surgical History:  Procedure Laterality Date  . ANKLE FRACTURE SURGERY     chronic pain as result  . COLONOSCOPY    . FINGER SURGERY Left    bite by a crab  . HERNIA REPAIR     jan 2012, [place]    There were no vitals filed for this visit.   Subjective Assessment - 03/16/20 0806    Subjective Pt states he feels that walking is getting better. He feels less pain in general but has moments of pain when bending over or extended periods of time and then coming back up to standing.    Limitations Walking;Lifting;House hold activities    How long can you stand comfortably? 1 hour    How long can you walk comfortably? 15-20    Diagnostic tests X-ray    Pain Score 2     Pain Location Back    Pain Orientation Right;Left    Pain Descriptors / Indicators Aching;Sore    Pain Onset More than a month ago    Aggravating Factors  bending and then lifting    Pain Relieving Factors rest                              [facility] Adult PT Treatment/Exercise - 03/16/20 0001      Exercises   Exercises Lumbar;Knee/Hip;Other Exercises      Lumbar Exercises: Stretches   Passive Hamstring Stretch Right;Left;3 reps;30 seconds    Passive Hamstring Stretch Limitations Seated    Lower Trunk Rotation 10 seconds    Lower Trunk Rotation Limitations 10x    Pelvic Tilt --    Figure 4 Stretch --    Other Lumbar Stretch Exercise LTR 3s 10x; fwd flexion stretch 10s 10x    Other Lumbar Stretch Exercise standing QL stretch 30s 1x each      Lumbar Exercises: Standing   Functional Squats 20 reps    Functional Squats Limitations 2x10 STS    Other Standing Lumbar Exercises stading hip hing at mirror 2x10; box lift from floor no weight 3x5      Lumbar Exercises: Supine       Bridge 20 reps      Manual Therapy   Manual Therapy Joint mobilization;Soft tissue mobilization  Joint Mobilization prone PA grade II-III L1-5    Soft tissue mobilization bilat lumbar paraspinals L>R ischemic pressure                  PT Education - 03/16/20 0857    Education Details HEP update, pain free ROM, lifting mechanics, LBP protection strategies    Person(s) Educated Patient    Methods Explanation;Demonstration;Handout    Comprehension Verbalized understanding;Returned demonstration            PT Short Term Goals - 03/01/20 1055      PT SHORT TERM GOAL #1   Title Pt will become independent with HEP without cuing or edu regarding compliance.    Time 2    Period Weeks    Status New    Target Date 03/15/20      PT SHORT TERM GOAL #2   Title Pt will demonstrate ability to perform STS without bilat UE support in order to demonstrates improvement in lumbopelvic stability and LE strength    Time 4    Period Weeks    Status New    Target Date 03/29/20      PT SHORT TERM GOAL #3   Title Pt will self report decrease of back pain and ability to lift >25 lbs in order to demonstrate  functional improvement in L/S stability.    Time 4    Period Weeks    Status New    Target Date 03/29/20             PT Long Term Goals - 03/01/20 1056      PT LONG TERM GOAL #1   Title Pt will demonstrate TUG score of under 10s in order to demonstrate functional improvement in balance and gait speed.    Time 6    Period Weeks    Status New    Target Date 04/12/20      PT LONG TERM GOAL #2   Title Pt will demonstrates 5XSTS without UE support in under 10s to demonstrate functional improvement in LE strength and back pain.    Time 8    Period Weeks    Status New    Target Date 04/26/20                 Plan - 03/16/20 0825    Clinical Impression Statement Pt presented with two nodules located bilaterally across L5-S1. Nodules were TTP and responded well to [facility] suggsting muscle tissue, decreased turgor after manual.. Will continue to monitor across future sessions. Pt was able to progress fucntional movement patterns at today's session to include working on lifting mechancis, hip hinging, and squatting to lower surface. Pt required increased VC and TC during hip hinging for lumbopelvic dissociation and neutral spine. Pt has tendency to remain kyphotic during lifting and requires visual feedback through mirror. Pt would benefit from continued skilled therapy in order to address functional mobility, strength, and balance deficits in order to maximize safety for full return to community mobility participation.    Personal Factors and Comorbidities Age;Comorbidity 1;Fitness    Examination-Activity Limitations Lift;Bend;Carry;Sleep;Squat;Stairs    Examination-Participation Restrictions Driving;Yard Work;Other;Shop    Stability/Clinical Decision Making Evolving/Moderate complexity    Rehab Potential Fair    PT Frequency 2x / week    PT Duration 8 weeks    PT Treatment/Interventions Aquatic Therapy;Cryotherapy;Electrical Stimulation;Moist Heat;Traction;Gait training;Stair  training;Functional mobility training;Therapeutic activities;Therapeutic exercise;Neuromuscular re-education;Balance training;Patient/family education;Manual techniques;Passive range of motion;Dry needling;Energy conservation;Joint Manipulations;Spinal Manipulations    PT Next  Visit Plan Review HEP, review hip hinge, box lift, farmer carry    PT Home Exercise Plan HEP2GO printed handout to pt    Consulted and Agree with Plan of Care Patient           Patient will benefit from skilled therapeutic intervention in order to improve the following deficits and impairments:  Abnormal gait,Decreased balance,Decreased mobility,Difficulty walking,Increased muscle spasms,Decreased range of motion,Decreased activity tolerance,Decreased strength,Impaired flexibility,Pain  Visit Diagnosis: Left low back pain, unspecified chronicity, unspecified whether sciatica present  Muscle weakness (generalized)  Difficulty walking     Problem List Patient Active Problem List   Diagnosis Date Noted  . Hiatal hernia 01/19/2020  . Pyogenic granuloma 08/21/2019  . Mass of left finger 08/13/2019  . Chronic narcotic use 10/10/2016  . Arthritis of left foot 08/27/2016  . Allergic rhinitis 02/11/2014  . Arthritis of right ankle 02/11/2014  . Family history of melanoma 02/11/2014  . GERD (gastroesophageal reflux disease) 02/11/2014  . Former smoker 02/11/2014  . INSOMNIA, CHRONIC 07/01/2009  . BPH (benign prostatic hyperplasia) 12/17/2006  . Actinic keratosis 12/17/2006  . History of colonic polyps 08/26/2006  . Hyperlipidemia 08/19/2006  . Essential hypertension 08/19/2006   Daleen Bo PT, DPT 03/16/20 9:18 AM   [facility] [address] [place], [place], [address] Phone: [phone]   Fax:  [phone]  Name: Matthew Watson MRN: [id number] Date of Birth: [birth year]

## 2020-03-16 NOTE — Patient Instructions (Signed)
Access Code: [email or web address] URL: [email or web address] Date: 03/16/2020 Prepared by: Daleen Bo  Exercises Standing Hip Hinge with Dowel - 1 x daily - 7 x weekly - 2 sets - 10 reps

## 2020-03-21 ENCOUNTER — Ambulatory Visit: Payer: Medicare PPO | Admitting: Physical Therapy

## 2020-03-21 ENCOUNTER — Encounter: Payer: Self-pay | Admitting: Physical Therapy

## 2020-03-21 ENCOUNTER — Other Ambulatory Visit: Payer: Self-pay

## 2020-03-21 DIAGNOSIS — M6281 Muscle weakness (generalized): Secondary | ICD-10-CM | POA: Diagnosis not present

## 2020-03-21 DIAGNOSIS — R262 Difficulty in walking, not elsewhere classified: Secondary | ICD-10-CM | POA: Diagnosis not present

## 2020-03-21 DIAGNOSIS — M545 Low back pain, unspecified: Secondary | ICD-10-CM | POA: Diagnosis not present

## 2020-03-21 NOTE — Therapy (Signed)
[facility] [address] [place], [place], [address] Phone: [phone]   Fax:  [phone]  Physical Therapy Treatment  Patient Details  Name: Matthew Watson MRN: [id number] Date of Birth: 11/03/1950 Referring Provider (PT): Dr. Junius Roads   Encounter Date: 03/21/2020   PT End of Session - 03/21/20 0824    Visit Number 5    Number of Visits 17    Date for PT Re-Evaluation 05/30/20    Authorization Type [employer]    PT Start Time 0805    PT Stop Time 0845    PT Time Calculation (min) 40 min    Activity Tolerance Patient tolerated treatment well;No increased pain    Behavior During Therapy WFL for tasks assessed/performed           Past Medical History:  Diagnosis Date  . Adenomatous polyp of colon   . Allergy    seasonal  . Arthritis   . BPH (benign prostatic hyperplasia)   . Diverticulosis 09/2010   episode of diverticulitis  . GERD (gastroesophageal reflux disease)   . Hyperlipidemia   . Hypertension   . Low back pain     Past Surgical History:  Procedure Laterality Date  . ANKLE FRACTURE SURGERY     chronic pain as result  . COLONOSCOPY    . FINGER SURGERY Left    bite by a crab  . HERNIA REPAIR     jan 2012, [place]    There were no vitals filed for this visit.   Subjective Assessment - 03/21/20 0821    Subjective Pt states he has had no pain since last session. He was a little sore  after the lifting mechanics but it only lasted about a day.    Limitations Walking;Lifting;House hold activities    How long can you stand comfortably? 1 hour    How long can you walk comfortably? 15-20    Diagnostic tests X-ray    Currently in Pain? No/denies    Pain Score 0-No pain    Pain Location Back    Pain Orientation Left;Right    Pain Descriptors / Indicators Aching;Sore    Pain Type Acute pain    Pain Onset More than a month ago                             [facility] Adult PT  Treatment/Exercise - 03/21/20 0001      Exercises   Exercises Lumbar;Knee/Hip;Other Exercises      Lumbar Exercises: Stretches   Passive Hamstring Stretch Right;Left;3 reps;30 seconds    Passive Hamstring Stretch Limitations Seated    Lower Trunk Rotation 10 seconds    Lower Trunk Rotation Limitations 10x    Other Lumbar Stretch Exercise LTR 3s 10x; fwd flexion stretch 10s 10x    Other Lumbar Stretch Exercise seated QL stretch 30s 1x each      Lumbar Exercises: Standing   Functional Squats --    Functional Squats Limitations --    Other Standing Lumbar Exercises standing hip hing at mirror 2x10; 45lb weight lift from box height 3x5    Other Standing Lumbar Exercises Paloff press 10x, famer's carry 10x GTB      Lumbar Exercises: Supine   Bridge --      Manual Therapy   Manual Therapy Joint mobilization;Soft tissue mobilization    Joint Mobilization prone PA grade II-III L1-5, UPA R grade III L1-5    Soft  tissue mobilization --                  PT Education - 03/21/20 0823    Education Details HEP update,  lifting mechanics, LBP protection strategies    Person(s) Educated Patient    Methods Explanation;Demonstration    Comprehension Verbalized understanding;Returned demonstration            PT Short Term Goals - 03/01/20 1055      PT SHORT TERM GOAL #1   Title Pt will become independent with HEP without cuing or edu regarding compliance.    Time 2    Period Weeks    Status New    Target Date 03/15/20      PT SHORT TERM GOAL #2   Title Pt will demonstrate ability to perform STS without bilat UE support in order to demonstrates improvement in lumbopelvic stability and LE strength    Time 4    Period Weeks    Status New    Target Date 03/29/20      PT SHORT TERM GOAL #3   Title Pt will self report decrease of back pain and ability to lift >25 lbs in order to demonstrate functional improvement in L/S stability.    Time 4    Period Weeks    Status New     Target Date 03/29/20             PT Long Term Goals - 03/01/20 1056      PT LONG TERM GOAL #1   Title Pt will demonstrate TUG score of under 10s in order to demonstrate functional improvement in balance and gait speed.    Time 6    Period Weeks    Status New    Target Date 04/12/20      PT LONG TERM GOAL #2   Title Pt will demonstrates 5XSTS without UE support in under 10s to demonstrate functional improvement in LE strength and back pain.    Time 8    Period Weeks    Status New    Target Date 04/26/20                 Plan - 03/21/20 0837    Clinical Impression Statement Pt was able to progression fucntional lifting at today's session to 45 lbs from knee height. However, pt required VC and TC for hip hinge and hip extension during the concentric portion of the lift. After the 1st repetition, he was able to perform the lifting with neutral spine and good hip drive. Pt was also able to self correct when he had lower back rounding or did not shorten the external lever arm of the bar before lifting. Pt will likely need review at next session or progression to lower height weight. Pt would benefit from continued skilled therapy in order to address functional mobility, strength, and balance deficits in order to maximize safety for full return to community mobility participation.    Personal Factors and Comorbidities Age;Comorbidity 1;Fitness    Examination-Activity Limitations Lift;Bend;Carry;Sleep;Squat;Stairs    Examination-Participation Restrictions Driving;Yard Work;Other;Shop    Stability/Clinical Decision Making Evolving/Moderate complexity    Rehab Potential Fair    PT Frequency 2x / week    PT Duration 8 weeks    PT Treatment/Interventions Aquatic Therapy;Cryotherapy;Electrical Stimulation;Moist Heat;Traction;Gait training;Stair training;Functional mobility training;Therapeutic activities;Therapeutic exercise;Neuromuscular re-education;Balance training;Patient/family  education;Manual techniques;Passive range of motion;Dry needling;Energy conservation;Joint Manipulations;Spinal Manipulations    PT Next Visit Plan Review HEP, review hip hinge, box lift,  farmer carry    PT [place] printed handout to pt    Consulted and Agree with Plan of Care Patient           Patient will benefit from skilled therapeutic intervention in order to improve the following deficits and impairments:  Abnormal gait,Decreased balance,Decreased mobility,Difficulty walking,Increased muscle spasms,Decreased range of motion,Decreased activity tolerance,Decreased strength,Impaired flexibility,Pain  Visit Diagnosis: Left low back pain, unspecified chronicity, unspecified whether sciatica present  Muscle weakness (generalized)  Difficulty walking     Problem List Patient Active Problem List   Diagnosis Date Noted  . Hiatal hernia 01/19/2020  . Pyogenic granuloma 08/21/2019  . Mass of left finger 08/13/2019  . Chronic narcotic use 10/10/2016  . Arthritis of left foot 08/27/2016  . Allergic rhinitis 02/11/2014  . Arthritis of right ankle 02/11/2014  . Family history of melanoma 02/11/2014  . GERD (gastroesophageal reflux disease) 02/11/2014  . Former smoker 02/11/2014  . INSOMNIA, CHRONIC 07/01/2009  . BPH (benign prostatic hyperplasia) 12/17/2006  . Actinic keratosis 12/17/2006  . History of colonic polyps 08/26/2006  . Hyperlipidemia 08/19/2006  . Essential hypertension 08/19/2006    Daleen Bo PT, DPT 03/21/20 12:15 PM   [facility] [address] [place], [place], [address] Phone: [phone]   Fax:  [phone]  Name: DEX BLAKELY MRN: [id number] Date of Birth: [birth year]

## 2020-03-23 ENCOUNTER — Encounter: Payer: Medicare PPO | Admitting: Physical Therapy

## 2020-03-28 ENCOUNTER — Other Ambulatory Visit: Payer: Self-pay

## 2020-03-28 ENCOUNTER — Ambulatory Visit: Payer: Medicare PPO | Admitting: Physical Therapy

## 2020-03-28 ENCOUNTER — Encounter: Payer: Self-pay | Admitting: Physical Therapy

## 2020-03-28 DIAGNOSIS — M6281 Muscle weakness (generalized): Secondary | ICD-10-CM | POA: Diagnosis not present

## 2020-03-28 DIAGNOSIS — M545 Low back pain, unspecified: Secondary | ICD-10-CM | POA: Diagnosis not present

## 2020-03-28 DIAGNOSIS — R262 Difficulty in walking, not elsewhere classified: Secondary | ICD-10-CM | POA: Diagnosis not present

## 2020-03-28 NOTE — Therapy (Signed)
[facility] [address] [place], [place], [address] Phone: [phone]   Fax:  [phone]  Physical Therapy Treatment  Patient Details  Name: Matthew Watson MRN: [id number] Date of Birth: 11-08-1950 Referring Provider (PT): Dr. Junius Roads   Encounter Date: 03/28/2020   PT End of Session - 03/28/20 1135    Visit Number 6    Number of Visits 17    Date for PT Re-Evaluation 05/30/20    Authorization Type [employer]    PT Start Time 0803    PT Stop Time 0845    PT Time Calculation (min) 42 min    Activity Tolerance Patient tolerated treatment well;No increased pain    Behavior During Therapy WFL for tasks assessed/performed           Past Medical History:  Diagnosis Date  . Adenomatous polyp of colon   . Allergy    seasonal  . Arthritis   . BPH (benign prostatic hyperplasia)   . Diverticulosis 09/2010   episode of diverticulitis  . GERD (gastroesophageal reflux disease)   . Hyperlipidemia   . Hypertension   . Low back pain     Past Surgical History:  Procedure Laterality Date  . ANKLE FRACTURE SURGERY     chronic pain as result  . COLONOSCOPY    . FINGER SURGERY Left    bite by a crab  . HERNIA REPAIR     jan 2012, [facility]    There were no vitals filed for this visit.   Subjective Assessment - 03/28/20 0825    Subjective Pt states he has had no pain since last session. He states he did have trouble with catching his feet on something while hunting and had a stumble. He reports being able to lift a ~20 lb cooler multiple times over the weekend without back pain.    Limitations Walking;Lifting;House hold activities    How long can you stand comfortably? 1 hour    How long can you walk comfortably? 15-20    Diagnostic tests X-ray    Currently in Pain? No/denies    Pain Score 0-No pain    Pain Onset More than a month ago                             [facility] Adult PT Treatment/Exercise -  03/28/20 0001      Exercises   Exercises Lumbar;Knee/Hip;Other Exercises      Lumbar Exercises: Stretches   Passive Hamstring Stretch Right;Left;3 reps;30 seconds    Passive Hamstring Stretch Limitations Seated    Lower Trunk Rotation --    Lower Trunk Rotation Limitations --    Other Lumbar Stretch Exercise fwd flexion stretch 10s 10x    Other Lumbar Stretch Exercise seated QL stretch 30s 1x each      Lumbar Exercises: Aerobic   Recumbent Bike L1 6 mins      Lumbar Exercises: Standing   Other Standing Lumbar Exercises side stepping 29ft 2x;  standing marching 3lb march 20x;  45lb weight lift from box height 3x5  SLS 30s 3x each   Other Standing Lumbar Exercises Paloff press 2x10 GTB                               PT Education - 03/28/20 1134    Education Details HEP update, lifting mechanics, LBP protection strategies  Person(s) Educated Patient    Methods Explanation;Demonstration;Handout    Comprehension Verbalized understanding;Returned demonstration            PT Short Term Goals - 03/01/20 1055      PT SHORT TERM GOAL #1   Title Pt will become independent with HEP without cuing or edu regarding compliance.    Time 2    Period Weeks    Status New    Target Date 03/15/20      PT SHORT TERM GOAL #2   Title Pt will demonstrate ability to perform STS without bilat UE support in order to demonstrates improvement in lumbopelvic stability and LE strength    Time 4    Period Weeks    Status New    Target Date 03/29/20      PT SHORT TERM GOAL #3   Title Pt will self report decrease of back pain and ability to lift >25 lbs in order to demonstrate functional improvement in L/S stability.    Time 4    Period Weeks    Status New    Target Date 03/29/20             PT Long Term Goals - 03/01/20 1056      PT LONG TERM GOAL #1   Title Pt will demonstrate TUG score of under 10s in order to demonstrate functional improvement in balance and gait speed.     Time 6    Period Weeks    Status New    Target Date 04/12/20      PT LONG TERM GOAL #2   Title Pt will demonstrates 5XSTS without UE support in under 10s to demonstrate functional improvement in LE strength and back pain.    Time 8    Period Weeks    Status New    Target Date 04/26/20                 Plan - 03/28/20 1135    Clinical Impression Statement Pt demonstrates good retention of edu re liftng mechanics and core activation from previous session. Pt required less VC and TC during 45 lb lifting and pt demonstrated improved fluidity of movement. However, pt does have LE strength and endurance deficits with isolated strengthening. Pt's reported balance deficits may be attributed to decreased hip flexion and hip strength during dynamic outdoor related activities. Likely able to progress to lower height surface and more direct balance training at next session. Pt would benefit from continued skilled therapy in order to address functional mobility, strength, and balance deficits in order to maximize safety for full return to community mobility participation.    Personal Factors and Comorbidities Age;Comorbidity 1;Fitness    Examination-Activity Limitations Lift;Bend;Carry;Sleep;Squat;Stairs    Examination-Participation Restrictions Driving;Yard Work;Other;Shop    Stability/Clinical Decision Making Evolving/Moderate complexity    Rehab Potential Fair    PT Frequency 2x / week    PT Duration 8 weeks    PT Treatment/Interventions Aquatic Therapy;Cryotherapy;Electrical Stimulation;Moist Heat;Traction;Gait training;Stair training;Functional mobility training;Therapeutic activities;Therapeutic exercise;Neuromuscular re-education;Balance training;Patient/family education;Manual techniques;Passive range of motion;Dry needling;Energy conservation;Joint Manipulations;Spinal Manipulations    PT Next Visit Plan Review HEP, farmer carry, standing hip ABD with band, SL step up, lower deadlift     PT Home Exercise Plan HEP2GO printed handout to pt    Consulted and Agree with Plan of Care Patient           Patient will benefit from skilled therapeutic intervention in order to improve the following deficits and  impairments:  Abnormal gait,Decreased balance,Decreased mobility,Difficulty walking,Increased muscle spasms,Decreased range of motion,Decreased activity tolerance,Decreased strength,Impaired flexibility,Pain  Visit Diagnosis: Left low back pain, unspecified chronicity, unspecified whether sciatica present  Muscle weakness (generalized)  Difficulty walking     Problem List Patient Active Problem List   Diagnosis Date Noted  . Hiatal hernia 01/19/2020  . Pyogenic granuloma 08/21/2019  . Mass of left finger 08/13/2019  . Chronic narcotic use 10/10/2016  . Arthritis of left foot 08/27/2016  . Allergic rhinitis 02/11/2014  . Arthritis of right ankle 02/11/2014  . Family history of melanoma 02/11/2014  . GERD (gastroesophageal reflux disease) 02/11/2014  . Former smoker 02/11/2014  . INSOMNIA, CHRONIC 07/01/2009  . BPH (benign prostatic hyperplasia) 12/17/2006  . Actinic keratosis 12/17/2006  . History of colonic polyps 08/26/2006  . Hyperlipidemia 08/19/2006  . Essential hypertension 08/19/2006   Daleen Bo PT, DPT 03/28/20 11:42 AM   [facility] [address] [place], [place], [address] Phone: [phone]   Fax:  [phone]  Name: JOEANGEL JEANPAUL MRN: [id number] Date of Birth: 1950-04-24

## 2020-03-28 NOTE — Patient Instructions (Signed)
Access Code: [email or web address] URL: [email or web address] Date: 03/28/2020 Prepared by: Daleen Bo  Exercises Single Leg Stance with Support - 1 x daily - 7 x weekly - 2 sets - 2 reps - 30 hold Side Stepping with Resistance at Thighs - 1 x daily - 7 x weekly - 3 sets - 10 reps

## 2020-03-30 ENCOUNTER — Encounter: Payer: Medicare PPO | Admitting: Physical Therapy

## 2020-04-04 ENCOUNTER — Encounter: Payer: Self-pay | Admitting: Physical Therapy

## 2020-04-04 ENCOUNTER — Other Ambulatory Visit: Payer: Self-pay

## 2020-04-04 ENCOUNTER — Ambulatory Visit: Payer: Medicare PPO | Admitting: Physical Therapy

## 2020-04-04 DIAGNOSIS — L820 Inflamed seborrheic keratosis: Secondary | ICD-10-CM | POA: Diagnosis not present

## 2020-04-04 DIAGNOSIS — L814 Other melanin hyperpigmentation: Secondary | ICD-10-CM | POA: Diagnosis not present

## 2020-04-04 DIAGNOSIS — R262 Difficulty in walking, not elsewhere classified: Secondary | ICD-10-CM | POA: Diagnosis not present

## 2020-04-04 DIAGNOSIS — D225 Melanocytic nevi of trunk: Secondary | ICD-10-CM | POA: Diagnosis not present

## 2020-04-04 DIAGNOSIS — L821 Other seborrheic keratosis: Secondary | ICD-10-CM | POA: Diagnosis not present

## 2020-04-04 DIAGNOSIS — L720 Epidermal cyst: Secondary | ICD-10-CM | POA: Diagnosis not present

## 2020-04-04 DIAGNOSIS — M6281 Muscle weakness (generalized): Secondary | ICD-10-CM

## 2020-04-04 DIAGNOSIS — M545 Low back pain, unspecified: Secondary | ICD-10-CM | POA: Diagnosis not present

## 2020-04-04 DIAGNOSIS — D1801 Hemangioma of skin and subcutaneous tissue: Secondary | ICD-10-CM | POA: Diagnosis not present

## 2020-04-04 NOTE — Therapy (Addendum)
[facility] [address] [place], [place], [address] Phone: [phone]   Fax:  [phone]  Physical Therapy Progress Note/Discharge  Patient Details  Name: Matthew Watson MRN: [id number] Date of Birth: Sep 09, 1950 Referring Provider (PT): Dr. Junius Roads   Encounter Date: 04/04/2020   PT End of Session - 04/04/20 0813    Visit Number 7    Number of Visits 17    Date for PT Re-Evaluation 05/30/20    Authorization Type [employer]    PT Start Time 0802    PT Stop Time 0840    PT Time Calculation (min) 38 min    Activity Tolerance Patient tolerated treatment well;No increased pain    Behavior During Therapy WFL for tasks assessed/performed           Past Medical History:  Diagnosis Date   Adenomatous polyp of colon    Allergy    seasonal   Arthritis    BPH (benign prostatic hyperplasia)    Diverticulosis 09/2010   episode of diverticulitis   GERD (gastroesophageal reflux disease)    Hyperlipidemia    Hypertension    Low back pain     Past Surgical History:  Procedure Laterality Date   ANKLE FRACTURE SURGERY     chronic pain as result   COLONOSCOPY     FINGER SURGERY Left    bite by a crab   HERNIA REPAIR     jan 2012, [place]    There were no vitals filed for this visit.   Subjective Assessment - 04/04/20 0808    Subjective Pt states he bent over to pull weeds and "couldn't hardly straigthen back up." He states the back pain otherwise has been good. He complains his balance is still not good when trying to do SLS.    Currently in Pain? No/denies    Pain Score 0-No pain              [facility] PT Assessment - 04/04/20 0001      AROM   Lumbar Flexion 75%    Lumbar Extension 45%    Lumbar - Right Side Bend 75%    Lumbar - Left Side Bend 75%    Lumbar - Right Rotation 65%    Lumbar - Left Rotation 65%      Strength   Right Hip Flexion 4+/5    Right Hip ABduction 4/5    Right Hip ADduction  5/5    Left Hip Flexion 4+/5    Left Hip ABduction 4+/5    Left Hip ADduction 5/5      Palpation   Palpation comment decreased tone through L/S, small nodule still remains on R without pain during palpation      Transfers   Five time sit to stand comments  No UE support needed, improved speed, able to perform STS with external resistance      Ambulation/Gait   Gait Pattern Lateral trunk lean to right;Decreased stride length                         [facility] Adult PT Treatment/Exercise - 04/04/20 0001      Exercises   Exercises Lumbar;Knee/Hip;Other Exercises      Lumbar Exercises: Stretches   Passive Hamstring Stretch Right;Left;3 reps;30 seconds    Passive Hamstring Stretch Limitations Seated    Other Lumbar Stretch Exercise fwd flexion stretch 10s 10x    Other Lumbar Stretch Exercise seated QL stretch 30s  1x each      Lumbar Exercises: Aerobic   Recumbent Bike L1 6 mins      Lumbar Exercises: Standing   Other Standing Lumbar Exercises side stepping 32f GTB 2x; standing marching 3lb march 20x; 25lb weight lift from low crate height 3x5    Other Standing Lumbar Exercises Paloff press 2x10 GTB tandem; SLS 30s 3x single UE      Knee/Hip Exercises: Standing   Functional Squat 3 sets;5 reps    Functional Squat Limitations blue TB around waist                  PT Education - 04/04/20 0813    Education Details HEP update, lifting mechanics, LE and core strength effect on balance, exam findings/improvement    Person(s) Educated Patient    Methods Explanation;Demonstration    Comprehension Verbalized understanding;Returned demonstration            PT Short Term Goals - 04/04/20 1202      PT SHORT TERM GOAL #1   Title Pt will become independent with HEP without cuing or edu regarding compliance.    Time 2    Period Weeks    Status Achieved    Target Date 03/15/20      PT SHORT TERM GOAL #2   Title Pt will demonstrate ability to perform STS without  bilat UE support in order to demonstrates improvement in lumbopelvic stability and LE strength    Time 4    Period Weeks    Status Achieved    Target Date 03/29/20      PT SHORT TERM GOAL #3   Title Pt will self report decrease of back pain and ability to lift >25 lbs in order to demonstrate functional improvement in L/S stability.    Time 4    Period Weeks    Status Partially Met    Target Date 03/29/20             PT Long Term Goals - 04/04/20 1202      PT LONG TERM GOAL #1   Title Pt will demonstrate TUG score of under 10s in order to demonstrate functional improvement in balance and gait speed.    Time 6    Period Weeks    Status Deferred      PT LONG TERM GOAL #2   Title Pt will demonstrates 5XSTS without UE support in under 10s to demonstrate functional improvement in LE strength and back pain.    Time 8    Period Weeks    Status Partially Met                 Plan - 04/04/20 0814    Clinical Impression Statement Pt progressing well with lumbar stability and SL strength at today's session. Pt has made significant improvement in terms of LE strength as well as core stability with lifting tasks. Pt was able to progression functional lifting and balance exercise at today's session without LOB or increased pain. Pt demonstrates ability to perform lifting with minor cuing for neutral spine and breathing mechanics. Pt was also able to progress STS strength to include external resistance and lower height. Pt does demonstrates L LE weakness at the hip which fatigues easily with repetitions, likely causing some balance deficits. Pt to decrease frequency of visits at this time. TUG to be performed at next session due to time. Pt would benefit from continued skilled therapy in order to address functional mobility, strength, and  balance deficits in order to maximize safety for full return to community mobility participation.    Personal Factors and Comorbidities Age;Comorbidity  1;Fitness    Examination-Activity Limitations Lift;Bend;Carry;Sleep;Squat;Stairs    Examination-Participation Restrictions Driving;Yard Work;Other;Shop    Stability/Clinical Decision Making Evolving/Moderate complexity    Rehab Potential Fair    PT Frequency 2x / week    PT Duration 8 weeks    PT Treatment/Interventions Aquatic Therapy;Cryotherapy;Electrical Stimulation;Moist Heat;Traction;Gait training;Stair training;Functional mobility training;Therapeutic activities;Therapeutic exercise;Neuromuscular re-education;Balance training;Patient/family education;Manual techniques;Passive range of motion;Dry needling;Energy conservation;Joint Manipulations;Spinal Manipulations    PT Next Visit Plan Review HEP, farmer carry, SL step up, lower deadlift, review standing offset march    PT Home Exercise Plan HEP2GO printed handout to pt    Consulted and Agree with Plan of Care Patient           Patient will benefit from skilled therapeutic intervention in order to improve the following deficits and impairments:  Abnormal gait,Decreased balance,Decreased mobility,Difficulty walking,Increased muscle spasms,Decreased range of motion,Decreased activity tolerance,Decreased strength,Impaired flexibility,Pain  Visit Diagnosis: Left low back pain, unspecified chronicity, unspecified whether sciatica present  Muscle weakness (generalized)  Difficulty walking     Problem List Patient Active Problem List   Diagnosis Date Noted   Hiatal hernia 01/19/2020   Pyogenic granuloma 08/21/2019   Mass of left finger 08/13/2019   Chronic narcotic use 10/10/2016   Arthritis of left foot 08/27/2016   Allergic rhinitis 02/11/2014   Arthritis of right ankle 02/11/2014   Family history of melanoma 02/11/2014   GERD (gastroesophageal reflux disease) 02/11/2014   Former smoker 02/11/2014   INSOMNIA, CHRONIC 07/01/2009   BPH (benign prostatic hyperplasia) 12/17/2006   Actinic keratosis 12/17/2006    History of colonic polyps 08/26/2006   Hyperlipidemia 08/19/2006   Essential hypertension 08/19/2006   Daleen Bo PT, DPT 04/04/20 12:43 PM   [facility] [address] [place], [place], [address] Phone: [phone]   Fax:  [phone]  Name: Matthew Watson MRN: [id number] Date of Birth: 05/10/1950   PHYSICAL THERAPY DISCHARGE SUMMARY  Visits from Start of Care: 7   Plan:                                                    Patient goals were not met. Patient is being discharged due to not returning since the last visit.  ?????

## 2020-04-11 ENCOUNTER — Encounter: Payer: Medicare PPO | Admitting: Physical Therapy

## 2020-04-23 ENCOUNTER — Encounter (HOSPITAL_BASED_OUTPATIENT_CLINIC_OR_DEPARTMENT_OTHER): Payer: Self-pay | Admitting: Emergency Medicine

## 2020-04-23 ENCOUNTER — Emergency Department (HOSPITAL_BASED_OUTPATIENT_CLINIC_OR_DEPARTMENT_OTHER): Payer: Medicare PPO

## 2020-04-23 ENCOUNTER — Emergency Department (HOSPITAL_BASED_OUTPATIENT_CLINIC_OR_DEPARTMENT_OTHER)
Admission: EM | Admit: 2020-04-23 | Discharge: 2020-04-23 | Disposition: A | Discharge status: Home / Self Care | Payer: Medicare PPO | Attending: Emergency Medicine | Admitting: Emergency Medicine

## 2020-04-23 ENCOUNTER — Other Ambulatory Visit: Payer: Self-pay

## 2020-04-23 DIAGNOSIS — I10 Essential (primary) hypertension: Secondary | ICD-10-CM | POA: Diagnosis not present

## 2020-04-23 DIAGNOSIS — K6389 Other specified diseases of intestine: Secondary | ICD-10-CM | POA: Diagnosis not present

## 2020-04-23 DIAGNOSIS — K5732 Diverticulitis of large intestine without perforation or abscess without bleeding: Secondary | ICD-10-CM | POA: Insufficient documentation

## 2020-04-23 DIAGNOSIS — Z79899 Other long term (current) drug therapy: Secondary | ICD-10-CM | POA: Diagnosis not present

## 2020-04-23 DIAGNOSIS — K5792 Diverticulitis of intestine, part unspecified, without perforation or abscess without bleeding: Secondary | ICD-10-CM

## 2020-04-23 DIAGNOSIS — K5730 Diverticulosis of large intestine without perforation or abscess without bleeding: Secondary | ICD-10-CM | POA: Diagnosis not present

## 2020-04-23 DIAGNOSIS — Z87891 Personal history of nicotine dependence: Secondary | ICD-10-CM | POA: Insufficient documentation

## 2020-04-23 DIAGNOSIS — R1030 Lower abdominal pain, unspecified: Secondary | ICD-10-CM | POA: Diagnosis present

## 2020-04-23 DIAGNOSIS — K8020 Calculus of gallbladder without cholecystitis without obstruction: Secondary | ICD-10-CM | POA: Diagnosis not present

## 2020-04-23 DIAGNOSIS — I708 Atherosclerosis of other arteries: Secondary | ICD-10-CM | POA: Diagnosis not present

## 2020-04-23 LAB — URINALYSIS, ROUTINE W REFLEX MICROSCOPIC
Bilirubin Urine: NEGATIVE
Glucose, UA: NEGATIVE mg/dL
Hgb urine dipstick: NEGATIVE
Ketones, ur: NEGATIVE mg/dL
Leukocytes,Ua: NEGATIVE
Nitrite: NEGATIVE
Protein, ur: NEGATIVE mg/dL
Specific Gravity, Urine: 1.025 (ref 1.005–1.030)
pH: 6 (ref 5.0–8.0)

## 2020-04-23 LAB — CBC WITH DIFFERENTIAL/PLATELET
Abs Immature Granulocytes: 0.04 10*3/uL (ref 0.00–0.07)
Basophils Absolute: 0 10*3/uL (ref 0.0–0.1)
Basophils Relative: 0 %
Eosinophils Absolute: 0 10*3/uL (ref 0.0–0.5)
Eosinophils Relative: 0 %
HCT: 43.5 % (ref 39.0–52.0)
Hemoglobin: 14.9 g/dL (ref 13.0–17.0)
Immature Granulocytes: 0 %
Lymphocytes Relative: 8 %
Lymphs Abs: 1.1 10*3/uL (ref 0.7–4.0)
MCH: 29.3 pg (ref 26.0–34.0)
MCHC: 34.3 g/dL (ref 30.0–36.0)
MCV: 85.5 fL (ref 80.0–100.0)
Monocytes Absolute: 0.9 10*3/uL (ref 0.1–1.0)
Monocytes Relative: 7 %
Neutro Abs: 11.4 10*3/uL — ABNORMAL HIGH (ref 1.7–7.7)
Neutrophils Relative %: 85 %
Platelets: 222 10*3/uL (ref 150–400)
RBC: 5.09 MIL/uL (ref 4.22–5.81)
RDW: 12.5 % (ref 11.5–15.5)
WBC: 13.5 10*3/uL — ABNORMAL HIGH (ref 4.0–10.5)
nRBC: 0 % (ref 0.0–0.2)

## 2020-04-23 LAB — COMPREHENSIVE METABOLIC PANEL
ALT: 18 U/L (ref 0–44)
AST: 17 U/L (ref 15–41)
Albumin: 4 g/dL (ref 3.5–5.0)
Alkaline Phosphatase: 95 U/L (ref 38–126)
Anion gap: 8 (ref 5–15)
BUN: 19 mg/dL (ref 8–23)
CO2: 25 mmol/L (ref 22–32)
Calcium: 9 mg/dL (ref 8.9–10.3)
Chloride: 103 mmol/L (ref 98–111)
Creatinine, Ser: 0.95 mg/dL (ref 0.61–1.24)
GFR, Estimated: >60 mL/min (ref >60)
Glucose, Bld: 113 mg/dL — ABNORMAL HIGH (ref 70–99)
Potassium: 3.9 mmol/L (ref 3.5–5.1)
Sodium: 136 mmol/L (ref 135–145)
Total Bilirubin: 1.6 mg/dL — ABNORMAL HIGH (ref 0.3–1.2)
Total Protein: 7 g/dL (ref 6.5–8.1)

## 2020-04-23 LAB — LIPASE, BLOOD: Lipase: 28 U/L (ref 11–51)

## 2020-04-23 MED ORDER — MORPHINE SULFATE 15 MG PO TABS: 7.5000 mg | ORAL_TABLET | ORAL | 0 refills | Status: DC | PRN

## 2020-04-23 MED ORDER — ACETAMINOPHEN 500 MG PO TABS
1000.0000 mg | ORAL_TABLET | Freq: Once | ORAL | Status: AC
  Administered 2020-04-23: 1000 mg via ORAL
  Filled 2020-04-23: qty 2

## 2020-04-23 MED ORDER — SODIUM CHLORIDE 0.9 % IV BOLUS
1000.0000 mL | Freq: Once | INTRAVENOUS | Status: AC
  Administered 2020-04-23: 1000 mL via INTRAVENOUS

## 2020-04-23 MED ORDER — AMOXICILLIN-POT CLAVULANATE 875-125 MG PO TABS
1.0000 | ORAL_TABLET | Freq: Once | ORAL | Status: AC
  Administered 2020-04-23: 1 via ORAL
  Filled 2020-04-23: qty 1

## 2020-04-23 MED ORDER — MORPHINE SULFATE (PF) 4 MG/ML IV SOLN
4.0000 mg | Freq: Once | INTRAVENOUS | Status: AC
Start: 2020-04-23 — End: 2020-04-23
  Administered 2020-04-23: 4 mg via INTRAVENOUS
  Filled 2020-04-23: qty 1

## 2020-04-23 MED ORDER — ONDANSETRON 4 MG PO TBDP: ORAL_TABLET | ORAL | 0 refills | Status: DC

## 2020-04-23 MED ORDER — ONDANSETRON HCL 4 MG/2ML IJ SOLN
4.0000 mg | Freq: Once | INTRAMUSCULAR | Status: AC
  Administered 2020-04-23: 4 mg via INTRAVENOUS
  Filled 2020-04-23: qty 2

## 2020-04-23 MED ORDER — IOHEXOL 300 MG/ML  SOLN
100.0000 mL | Freq: Once | INTRAMUSCULAR | Status: AC | PRN
  Administered 2020-04-23: 100 mL via INTRAVENOUS

## 2020-04-23 MED ORDER — AMOXICILLIN-POT CLAVULANATE 875-125 MG PO TABS: 1.0000 | ORAL_TABLET | Freq: Two times a day (BID) | ORAL | 0 refills | Status: DC

## 2020-04-23 NOTE — ED Triage Notes (Addendum)
Abd pain since 1700 yesterday, vomited x 2 at time of onset pain. Lower abd pain . Has taken 2 tabs of Cipro 500mg  from previous script

## 2020-04-23 NOTE — ED Notes (Signed)
ED Provider at bedside. 

## 2020-04-23 NOTE — ED Notes (Signed)
Patient verbalized understanding of dc instructions, prescriptions, follow up referrals and reasons to return to ER for reevaluation.  

## 2020-04-23 NOTE — Discharge Instructions (Signed)
Your CT scan showed acute diverticulitis.  Typically we treat this with antibiotics.  Please follow-up with your gastroenterologist.  The radiologist was concerned that there could be an underlying mass in your colon this is something that your gastroenterologist could not decide what to do next with you.  There is also some concern about your prostate and bladder looking irregular.  I have given you information for the urologist if you choose to follow-up with them.  He could also discuss this information with your family doctor.  Take tylenol 1000mg (2 extra strength) four times a day.   Then take the pain medicine if you feel like you need it. Narcotics do not help with the pain, they only make you care about it less.  You can become addicted to this, people may break into your house to steal it.  It will constipate you.  If you drive under the influence of this medicine you can get a DUI.

## 2020-04-23 NOTE — ED Provider Notes (Signed)
[place] EMERGENCY DEPARTMENT Provider Note   CSN: [id number] Arrival date & time: 04/23/20  0801     History Chief Complaint  Patient presents with  . Abdominal Pain    Matthew Watson is a 70 y.o. male.  70 yo M with a chief complaints of suprapubic abdominal pain.  Started yesterday and has had an episode of diaphoresis and vomiting.  Had a loose stool.  Typically he uses the bathroom every day but did not have a bowel movement yesterday.  No fevers that he knows of.  Felt his blood pressure was somewhat higher than normal.  Over the night he would have worsening pain and when he urinated would improve enough that he go back to sleep.  Had a history of symptoms similarly though he thinks about 20 years ago.  At that time was diagnosed with diverticulitis and improved with antibiotics.  The history is provided by the patient and the spouse.  Abdominal Pain Pain location:  Suprapubic Pain quality: aching   Pain radiates to:  Does not radiate Pain severity:  Moderate Onset quality:  Gradual Duration:  2 days Timing:  Intermittent Progression:  Waxing and waning Chronicity:  Recurrent Relieved by:  Nothing Worsened by:  Nothing Ineffective treatments:  None tried Associated symptoms: nausea and vomiting   Associated symptoms: no chest pain, no chills, no diarrhea, no fever and no shortness of breath        Past Medical History:  Diagnosis Date  . Adenomatous polyp of colon   . Allergy    seasonal  . Arthritis   . BPH (benign prostatic hyperplasia)   . Diverticulosis 09/2010   episode of diverticulitis  . GERD (gastroesophageal reflux disease)   . Hyperlipidemia   . Hypertension   . Low back pain     Patient Active Problem List   Diagnosis Date Noted  . Hiatal hernia 01/19/2020  . Pyogenic granuloma 08/21/2019  . Mass of left finger 08/13/2019  . Chronic narcotic use 10/10/2016  . Arthritis of left foot 08/27/2016  . Allergic rhinitis 02/11/2014   . Arthritis of right ankle 02/11/2014  . Family history of melanoma 02/11/2014  . GERD (gastroesophageal reflux disease) 02/11/2014  . Former smoker 02/11/2014  . INSOMNIA, CHRONIC 07/01/2009  . BPH (benign prostatic hyperplasia) 12/17/2006  . Actinic keratosis 12/17/2006  . History of colonic polyps 08/26/2006  . Hyperlipidemia 08/19/2006  . Essential hypertension 08/19/2006    Past Surgical History:  Procedure Laterality Date  . ANKLE FRACTURE SURGERY     chronic pain as result  . COLONOSCOPY    . FINGER SURGERY Left    bite by a crab  . HERNIA REPAIR     jan 2012, [place]       Family History  Problem Relation Age of Onset  . Cancer Father        melanoma, cousin also died of it  . Cancer Brother        melanoma  . Colon polyps Sister   . Cancer Paternal Uncle        Colon cancer  . Colon cancer Paternal Uncle   . Esophageal cancer Neg Hx   . Stomach cancer Neg Hx     Social History   Tobacco Use  . Smoking status: Former Smoker    Packs/day: 0.25    Years: 10.00    Pack years: 2.50    Types: Cigarettes    Quit date: 03/23/1990    Years since  quitting: 30.1  . Smokeless tobacco: Never Used  . Tobacco comment: quit in 1992  Vaping Use  . Vaping Use: Never used  Substance Use Topics  . Alcohol use: Yes    Alcohol/week: 6.0 standard drinks    Types: 6 Cans of beer per week  . Drug use: No    Home Medications Prior to Admission medications   Medication Sig Start Date End Date Taking? Authorizing Provider  amoxicillin-clavulanate (AUGMENTIN) 875-125 MG tablet Take 1 tablet by mouth 2 (two) times daily. One po bid x 7 days 04/23/20  Yes Deno Etienne, DO  morphine (MSIR) 15 MG tablet Take 0.5 tablets (7.5 mg total) by mouth every 4 (four) hours as needed for severe pain. 04/23/20  Yes Deno Etienne, DO  ondansetron (ZOFRAN ODT) 4 MG disintegrating tablet 4mg  ODT q4 hours prn nausea/vomit 04/23/20  Yes Tyrone Nine, Doug Bucklin, DO  amLODipine-benazepril (LOTREL) 5-20 MG capsule  TAKE ONE CAPSULE BY MOUTH DAILY 03/14/20   Marin Olp, MD  atorvastatin (LIPITOR) 40 MG tablet TAKE ONE TABLET BY MOUTH DAILY 03/14/20   Marin Olp, MD  Azelastine HCl 0.15 % SOLN Place 1 spray into the nose as needed.    [provider]  baclofen (LIORESAL) 10 MG tablet Take 0.5-1 tablets (5-10 mg total) by mouth 3 (three) times daily as needed for muscle spasms. 02/23/20   Hilts, Legrand Como, MD  Cholecalciferol (VITAMIN D3 MAXIMUM STRENGTH) 125 MCG (5000 UT) capsule Take 1 capsule (5,000 Units total) by mouth daily. 02/24/20   Hilts, Legrand Como, MD  famotidine (PEPCID) 20 MG tablet Take 1 tablet (20 mg total) by mouth at bedtime. 09/04/19   Milus Banister, MD  finasteride (PROSCAR) 5 MG tablet TAKE ONE TABLET BY MOUTH DAILY 09/10/19   Marin Olp, MD  fluticasone [facility]) 50 MCG/ACT nasal spray SPRAY TWO SPRAYS IN [facility] NOSTRIL ONCE DAILY 07/04/18   Marin Olp, MD  meloxicam (MOBIC) 7.5 MG tablet TAKE ONE TABLET BY MOUTH DAILY 02/23/20   Marin Olp, MD  omeprazole (PRILOSEC) 20 MG capsule Take 1 capsule 20-30 minutes prior to breakfast. 09/04/19   Milus Banister, MD    Allergies    Dust mite mixed allergen ext [mite (d. farinae)]  Review of Systems   Review of Systems  Constitutional: Negative for chills and fever.  HENT: Negative for congestion and facial swelling.   Eyes: Negative for discharge and visual disturbance.  Respiratory: Negative for shortness of breath.   Cardiovascular: Negative for chest pain and palpitations.  Gastrointestinal: Positive for abdominal pain, nausea and vomiting. Negative for diarrhea.  Musculoskeletal: Negative for arthralgias and myalgias.  Skin: Negative for color change and rash.  Neurological: Negative for tremors, syncope and headaches.  Psychiatric/Behavioral: Negative for confusion and dysphoric mood.    Physical Exam Updated Vital Signs BP (!) 141/89   Pulse 64   Temp 99.5 F (37.5 C) (Oral)   Resp 18   Ht 5'  7" (1.702 m)   Wt 88.3 kg   SpO2 100%   BMI 30.48 kg/m   Physical Exam Vitals and nursing note reviewed.  Constitutional:      Appearance: He is well-developed.  HENT:     Head: Normocephalic and atraumatic.  Eyes:     Pupils: Pupils are equal, round, and reactive to light.  Neck:     Vascular: No JVD.  Cardiovascular:     Rate and Rhythm: Normal rate and regular rhythm.     Heart sounds: No  murmur heard. No friction rub. No gallop.   Pulmonary:     Effort: No respiratory distress.     Breath sounds: No wheezing.  Abdominal:     General: There is no distension.     Tenderness: There is no abdominal tenderness. There is no guarding or rebound.     Comments: No obvious tenderness on exam  Musculoskeletal:        General: Normal range of motion.     Cervical back: Normal range of motion and neck supple.  Skin:    Coloration: Skin is not pale.     Findings: No rash.  Neurological:     Mental Status: He is alert and oriented to person, place, and time.  Psychiatric:        Behavior: Behavior normal.     ED Results / Procedures / Treatments   Labs (all labs ordered are listed, but only abnormal results are displayed) Labs Reviewed  CBC WITH DIFFERENTIAL/PLATELET - Abnormal; Notable for the following components:      Result Value   WBC 13.5 (*)    Neutro Abs 11.4 (*)    All other components within normal limits  COMPREHENSIVE METABOLIC PANEL - Abnormal; Notable for the following components:   Glucose, Bld 113 (*)    Total Bilirubin 1.6 (*)    All other components within normal limits  LIPASE, BLOOD  URINALYSIS, ROUTINE W REFLEX MICROSCOPIC    EKG None  Radiology CT ABDOMEN PELVIS W CONTRAST  Result Date: 04/23/2020 CLINICAL DATA:  Left lower quadrant pain EXAM: CT ABDOMEN AND PELVIS WITH CONTRAST TECHNIQUE: Multidetector CT imaging of the abdomen and pelvis was performed using the standard protocol following bolus administration of intravenous contrast.  CONTRAST:  183mL OMNIPAQUE IOHEXOL 300 MG/ML  SOLN COMPARISON:  None. FINDINGS: Lower chest: There is slight posterior right base atelectasis. Lung bases otherwise are clear. Coronary artery calcification noted. Hepatobiliary: No focal liver lesions are appreciable. There is cholelithiasis. The gallbladder wall does not appear appreciably thickened by CT. There is no biliary duct dilatation. Pancreas: There is no appreciable pancreatic mass or inflammatory focus. Spleen: No splenic lesions are evident. Adrenals/Urinary Tract: Adrenals bilaterally appear normal. There is no evident renal mass or hydronephrosis on either side. There is no evident renal or ureteral calculus on either side. Urinary bladder is midline with wall thickness within normal limits. Stomach/Bowel: There are multiple sigmoid diverticula. There is wall thickening in the distal sigmoid colon region with soft tissue stranding in this area consistent with a degree of diverticulitis. Note that there are multiple diverticula elsewhere throughout the colon without apparent inflammatory change elsewhere. There is no evident abscess or perforation in the area of sigmoid diverticulitis. No bowel obstruction. Terminal ileum appears normal. Appendix appears normal. Vascular/Lymphatic: No evident abdominal aortic aneurysm. Aorta is somewhat tortuous. There are foci of aortic and iliac artery atherosclerosis. Major venous structures appear patent. Reproductive: There is loss of fat plane between the inferior bladder and prostate. There are prostatic calculi. Overall size of prostate appears within normal limits. Seminal vesicles appear normal in size and contour. Other: No evident abscess or ascites in the abdomen or pelvis. There is mild fat in the umbilicus. Fat noted in each inguinal ring. Musculoskeletal: There is degenerative change in the lower thoracic and lumbar regions. There is a degree of spinal stenosis at L2-3, L3-4, and L4-5 due to bony  hypertrophy and disc protrusion. No blastic or lytic bone lesions. No evident intramuscular lesions. IMPRESSION: 1. Findings  indicative of mid to distal sigmoid diverticulitis. No abscess or perforation in this area. Note that there is marked localized wall thickening in the distal sigmoid region. An underlying neoplasm could present in this manner. This finding will warrant direct visualization after treatment for acute inflammatory change in this region. 2. Sigmoid diverticulosis throughout the remainder of the colon. No other areas of colonic inflammation. No bowel obstruction. 3.  Appendix appears normal.  No abscess in the abdomen or pelvis. 4.  Cholelithiasis. 5. Spinal stenosis at L2-3, L3-4, and L4-5 due to bony hypertrophy and disc protrusion. 6. Aortic Atherosclerosis (ICD10-I70.0). Foci of iliac artery and coronary artery calcification noted. 7. Loss of fat plane between the inferior bladder and prostate. This finding warrants clinical assessment and PSA evaluation. Electronically Signed   By: Lowella Grip III M.D.   On: 04/23/2020 10:11    Procedures Procedures   Medications Ordered in ED Medications  amoxicillin-clavulanate (AUGMENTIN) 875-125 MG per tablet 1 tablet (has no administration in time range)  sodium chloride 0.9 % bolus 1,000 mL (0 mLs Intravenous Stopped 04/23/20 1028)  ondansetron (ZOFRAN) injection 4 mg (4 mg Intravenous Given 04/23/20 0851)  acetaminophen (TYLENOL) tablet 1,000 mg (1,000 mg Oral Given 04/23/20 0851)  morphine 4 MG/ML injection 4 mg (4 mg Intravenous Given 04/23/20 0851)  iohexol (OMNIPAQUE) 300 MG/ML solution 100 mL (100 mLs Intravenous Contrast Given 04/23/20 4970)    ED Course  I have reviewed the triage vital signs and the nursing notes.  Pertinent labs & imaging results that were available during my care of the patient were reviewed by me and considered in my medical decision making (see chart for details).    MDM Rules/Calculators/A&P                           70 yo M with a chief complaints of lower abdominal pain.  Going on since yesterday.  Had symptoms similar about 20 years ago and was diagnosed with diverticulitis.  He is borderline febrile orally here.  Will obtain blood work CT scan and reassess.  CT scan with acute diverticulitis without complication.  We will start the patient on antibiotics.  There were some incidental findings concerning for cancer both of the colon and may be the bladder or prostate.  I discussed this result with the patient.  Given follow-up for urology and GI.  10:31 AM:  I have discussed the diagnosis/risks/treatment options with the patient and believe the pt to be eligible for discharge home to follow-up with PCP, GI, urology. We also discussed returning to the ED immediately if new or worsening sx occur. We discussed the sx which are most concerning (e.g., sudden worsening pain, fever, inability to tolerate by mouth) that necessitate immediate return. Medications administered to the patient during their visit and any new prescriptions provided to the patient are listed below.  Medications given during this visit Medications  amoxicillin-clavulanate (AUGMENTIN) 875-125 MG per tablet 1 tablet (has no administration in time range)  sodium chloride 0.9 % bolus 1,000 mL (0 mLs Intravenous Stopped 04/23/20 1028)  ondansetron (ZOFRAN) injection 4 mg (4 mg Intravenous Given 04/23/20 0851)  acetaminophen (TYLENOL) tablet 1,000 mg (1,000 mg Oral Given 04/23/20 0851)  morphine 4 MG/ML injection 4 mg (4 mg Intravenous Given 04/23/20 0851)  iohexol (OMNIPAQUE) 300 MG/ML solution 100 mL (100 mLs Intravenous Contrast Given 04/23/20 2637)     The patient appears reasonably screen and/or stabilized for discharge and I  doubt any other medical condition or other [facility] requiring further screening, evaluation, or treatment in the ED at this time prior to discharge.   Final Clinical Impression(s) / ED Diagnoses Final diagnoses:   Diverticulitis    Rx / DC Orders ED Discharge Orders         Ordered    amoxicillin-clavulanate (AUGMENTIN) 875-125 MG tablet  2 times daily        04/23/20 1028    morphine (MSIR) 15 MG tablet  Every 4 hours PRN        04/23/20 1028    ondansetron (ZOFRAN ODT) 4 MG disintegrating tablet        04/23/20 [place], Lucillia Corson, DO 04/23/20 1031

## 2020-04-25 ENCOUNTER — Encounter: Payer: Self-pay | Admitting: Family Medicine

## 2020-05-09 NOTE — Progress Notes (Signed)
Phone [phone] In person visit   Subjective:   Matthew Watson is a 70 y.o. year old very pleasant male patient who presents for/with See problem oriented charting Chief Complaint  Patient presents with  . Follow-up    ED visit.    This visit occurred during the SARS-CoV-2 public health emergency.  Safety protocols were in place, including screening questions prior to the visit, additional usage of staff PPE, and extensive cleaning of exam room while observing appropriate contact time as indicated for disinfecting solutions.   Past Medical History-  Patient Active Problem List   Diagnosis Date Noted  . Chronic narcotic use 10/10/2016    Priority: High  . Spinal stenosis 05/11/2020    Priority: Medium  . Aortic atherosclerosis ([place]) 05/11/2020    Priority: Medium  . Arthritis of right ankle 02/11/2014    Priority: Medium  . BPH (benign prostatic hyperplasia) 12/17/2006    Priority: Medium  . Hyperlipidemia 08/19/2006    Priority: Medium  . Essential hypertension 08/19/2006    Priority: Medium  . Allergic rhinitis 02/11/2014    Priority: Low  . Family history of melanoma 02/11/2014    Priority: Low  . GERD (gastroesophageal reflux disease) 02/11/2014    Priority: Low  . Former smoker 02/11/2014    Priority: Low  . INSOMNIA, CHRONIC 07/01/2009    Priority: Low  . Actinic keratosis 12/17/2006    Priority: Low  . History of colonic polyps 08/26/2006    Priority: Low  . Hiatal hernia 01/19/2020  . Pyogenic granuloma 08/21/2019  . Mass of left finger 08/13/2019  . Arthritis of left foot 08/27/2016    Medications- reviewed and updated Current Outpatient Medications  Medication Sig Dispense Refill  . amLODipine-benazepril (LOTREL) 5-20 MG capsule TAKE ONE CAPSULE BY MOUTH DAILY 90 capsule 2  . atorvastatin (LIPITOR) 40 MG tablet TAKE ONE TABLET BY MOUTH DAILY 90 tablet 2  . Azelastine HCl 0.15 % SOLN Place 1 spray into the nose as needed.    . Cholecalciferol  (VITAMIN D3 MAXIMUM STRENGTH) 125 MCG (5000 UT) capsule Take 1 capsule (5,000 Units total) by mouth daily. 90 capsule 3  . famotidine (PEPCID) 20 MG tablet Take 1 tablet (20 mg total) by mouth at bedtime.    . finasteride (PROSCAR) 5 MG tablet TAKE ONE TABLET BY MOUTH DAILY 90 tablet 3  . fluticasone (FLONASE) 50 MCG/ACT nasal spray SPRAY TWO SPRAYS IN EACH NOSTRIL ONCE DAILY 48 g 1  . meloxicam (MOBIC) 7.5 MG tablet TAKE ONE TABLET BY MOUTH DAILY 90 tablet 1  . omeprazole (PRILOSEC) 20 MG capsule Take 1 capsule 20-30 minutes prior to breakfast. 90 capsule 3   No current facility-administered medications for this visit.     Objective:  BP 134/80   Pulse 62   Temp (!) 97 F (36.1 C) (Temporal)   Ht 5\' 7"  (1.702 m)   Wt 201 lb 6.4 oz (91.4 kg)   SpO2 98%   BMI 31.54 kg/m  Gen: NAD, resting comfortably CV: RRR no murmurs rubs or gallops Lungs: CTAB no crackles, wheeze, rhonchi Abdomen: soft/nontender/nondistended/normal bowel sounds. No rebound or guarding.  Ext: no edema but early in day- common for him to have some later in day Skin: warm, dry    Assessment and Plan   #Emergency room visit for diverticulitis S: Patient presented to the emergency room on April 23, 2020 with suprapubic abdominal pain as well as an episode of diaphoresis and vomiting as well as loose stool.  Patient with history of diverticulitis about 20 years ago.  Temperature was mildly elevated.  CT scan with acute diverticulitis without complication-patient was started on Augmentin.  He was also given pain control. Pain improved within a few days- fatigue took closer to 10 days to improve. No pain at the moment and fatigue has resolved  Patient was advised to follow-up with GI and urology due to the following CT scan findings (last colonoscopy normal 05/18/16): "Note that there is marked localized wall thickening in the distal sigmoid region. An underlying neoplasm could present in this manner. This finding  will warrant direct visualization after treatment for acute inflammatory change in this region. ...  7. Loss of fat plane between the inferior bladder and prostate. This finding warrants clinical assessment and PSA evaluation. " Lab Results  Component Value Date   PSA 0.21 01/19/2020   PSA 0.18 01/05/2019   PSA 0.19 01/02/2018   He was also noted to have spinal stenosis, aortic atherosclerosis, coronary artery calcium-we discussed each of these and added spinal stenosis and aortic atherosclerosis to problem list  Dr. Ardis Hughs of GI has been involved-note on 04/25/2020 "Please let him know that I reviewed his CAT scan.  I agree with his current antibiotic course.  The loose bowels are probably related to the antibiotics, that is actually fairly common.  He needs next available appointment with me, he should call here if he has serious pains again prior to then." -Patient has scheduled follow-up June 15, 2020 A/P: Patient appears to have responded well to antibiotics-no signs of active diverticulitis at this time- with thickening of sigmoid colon-potentially related to diverticulitis but we want GIs expert opinion- already scheduled for appointment on June 15, 2020-strongly encouraged him to keep this visit - In regards to the prostate changes-recommended we repeat PSA today-if low risk trend I do not think he needs follow-up with urology at this time- if PSA has significantly increased would follow through with recommended urology referral from the emergency room  -bilirubin hair high in hospital- will update CMP today  #hypertension S: medication: amlodipine- benazepril 5-20 mg BP Readings from Last 3 Encounters:  05/11/20 134/80  04/23/20 (!) 141/89  01/19/20 (!) 144/88  A/P: blood pressure was high in ER but improved over time, normal today- continue current meds  #hyperlipidemia/aortic atherosclerosis/coronary artery calcium S: Medication: Atorvastatin 40 mg Lab Results  Component  Value Date   CHOL 153 01/19/2020   HDL 59.50 01/19/2020   [place] 79 01/19/2020   LDLDIRECT 90.0 02/11/2014   TRIG 76.0 01/19/2020   CHOLHDL 3 01/19/2020   A/P: Considering patient has aortic atherosclerosis and coronary artery calcium-Ideal LDL goal would be 70 or less-we discussed working on healthy eating/regular exercise to bring this level down and if not improved by next year consider changing to atorvastatin 80 mg or rosuvastatin 40 mg  Recommended follow up: Keep visit in July or sooner visit if needed Future Appointments  Date Time Provider [place]  06/15/2020  9:50 AM Milus Banister, MD [facility]  07/20/2020  8:20 AM Marin Olp, MD [facility]  01/02/2021  8:45 AM [facility] HEALTH COACH [facility]  01/20/2021  8:20 AM Marin Olp, MD [facility]   Lab/Order associations:   ICD-10-CM   1. History of diverticulitis  Z87.19   2. Essential hypertension  I10 Comprehensive metabolic panel    CBC with Differential/Platelet  3. High risk medication use  Z79.899 PSA  4. Spinal stenosis, unspecified spinal region  M48.00   5. Aortic atherosclerosis ([facility])  I70.0   6. Hyperlipidemia, unspecified hyperlipidemia type  E78.5    Return precautions advised.  Garret Reddish, MD

## 2020-05-09 NOTE — Patient Instructions (Addendum)
Health Maintenance Due  Topic Date Due  . COVID-19 Vaccine  Team please log 2nd covid booster from April 15th [facility] 04/15/2020   Please stop by lab before you go If you have mychart- we will send your results within 3 business days of [place] receiving them.  If you do not have mychart- we will call you about results within 5 business days of [place] receiving them.  *please also note that you will see labs on mychart as soon as they post. I will later go in and write notes on them- will say "notes from Dr. Yong Channel"  Lets keep follow up with Dr. Ardis Hughs  Hold off on calling [facility] unless the PSA goes up significantly

## 2020-05-11 ENCOUNTER — Encounter: Payer: Self-pay | Admitting: Family Medicine

## 2020-05-11 ENCOUNTER — Other Ambulatory Visit: Payer: Self-pay

## 2020-05-11 ENCOUNTER — Ambulatory Visit: Payer: Medicare PPO | Admitting: Family Medicine

## 2020-05-11 VITALS — BP 134/80 | HR 62 | Temp 97.0°F | Ht 67.0 in | Wt 201.4 lb

## 2020-05-11 DIAGNOSIS — I700 Atherosclerosis of aorta: Secondary | ICD-10-CM | POA: Diagnosis not present

## 2020-05-11 DIAGNOSIS — Z8719 Personal history of other diseases of the digestive system: Secondary | ICD-10-CM

## 2020-05-11 DIAGNOSIS — I10 Essential (primary) hypertension: Secondary | ICD-10-CM

## 2020-05-11 DIAGNOSIS — E785 Hyperlipidemia, unspecified: Secondary | ICD-10-CM

## 2020-05-11 DIAGNOSIS — M4800 Spinal stenosis, site unspecified: Secondary | ICD-10-CM | POA: Diagnosis not present

## 2020-05-11 DIAGNOSIS — Z79899 Other long term (current) drug therapy: Secondary | ICD-10-CM

## 2020-05-11 LAB — CBC WITH DIFFERENTIAL/PLATELET
Basophils Absolute: 0 10*3/uL (ref 0.0–0.1)
Basophils Relative: 0.5 % (ref 0.0–3.0)
Eosinophils Absolute: 0.1 10*3/uL (ref 0.0–0.7)
Eosinophils Relative: 1.3 % (ref 0.0–5.0)
HCT: 42.7 % (ref 39.0–52.0)
Hemoglobin: 14.4 g/dL (ref 13.0–17.0)
Lymphocytes Relative: 20.9 % (ref 12.0–46.0)
Lymphs Abs: 1.3 10*3/uL (ref 0.7–4.0)
MCHC: 33.6 g/dL (ref 30.0–36.0)
MCV: 85.6 fl (ref 78.0–100.0)
Monocytes Absolute: 0.5 10*3/uL (ref 0.1–1.0)
Monocytes Relative: 8.5 % (ref 3.0–12.0)
Neutro Abs: 4.4 10*3/uL (ref 1.4–7.7)
Neutrophils Relative %: 68.8 % (ref 43.0–77.0)
Platelets: 222 10*3/uL (ref 150.0–400.0)
RBC: 4.99 Mil/uL (ref 4.22–5.81)
RDW: 13.4 % (ref 11.5–15.5)
WBC: 6.4 10*3/uL (ref 4.0–10.5)

## 2020-05-11 LAB — PSA: PSA: 0.17 ng/mL (ref 0.10–4.00)

## 2020-05-11 LAB — COMPREHENSIVE METABOLIC PANEL
ALT: 17 U/L (ref 0–53)
AST: 16 U/L (ref 0–37)
Albumin: 4.1 g/dL (ref 3.5–5.2)
Alkaline Phosphatase: 94 U/L (ref 39–117)
BUN: 24 mg/dL — ABNORMAL HIGH (ref 6–23)
CO2: 22 mEq/L (ref 19–32)
Calcium: 9.2 mg/dL (ref 8.4–10.5)
Chloride: 104 mEq/L (ref 96–112)
Creatinine, Ser: 0.9 mg/dL (ref 0.40–1.50)
GFR: 86.68 mL/min (ref >60.00)
Glucose, Bld: 88 mg/dL (ref 70–99)
Potassium: 4.1 mEq/L (ref 3.5–5.1)
Sodium: 138 mEq/L (ref 135–145)
Total Bilirubin: 0.7 mg/dL (ref 0.2–1.2)
Total Protein: 6.6 g/dL (ref 6.0–8.3)

## 2020-05-11 NOTE — Addendum Note (Signed)
Addended by: Brandy Hale on: 05/11/2020 09:21 AM   Modules accepted: Orders

## 2020-06-15 ENCOUNTER — Encounter: Payer: Self-pay | Admitting: Gastroenterology

## 2020-06-15 ENCOUNTER — Ambulatory Visit: Payer: Medicare PPO | Admitting: Gastroenterology

## 2020-06-15 VITALS — BP 120/70 | HR 80 | Ht 65.0 in | Wt 197.2 lb

## 2020-06-15 DIAGNOSIS — K5790 Diverticulosis of intestine, part unspecified, without perforation or abscess without bleeding: Secondary | ICD-10-CM

## 2020-06-15 DIAGNOSIS — Z86010 Personal history of colonic polyps: Secondary | ICD-10-CM

## 2020-06-15 MED ORDER — SUPREP BOWEL PREP KIT 17.5-3.13-1.6 GM/177ML PO SOLN: 1.0000 | ORAL | 0 refills | Status: DC

## 2020-06-15 NOTE — Progress Notes (Signed)
Review of pertinent gastrointestinal problems: 1.Personal history of colonic polyps:colonoscopy 2007 1 subCM adenoma; colonoscopy 2012 two polyps, one was a subCM adenoma. Colonoscopy 05/2016 No polyps, recommended recall at 10 years. 2. Incidental diverticulosis noted on colonoscopies. 3.  GERD, intermittent dysphagia led to EGD August 2021.  Small hiatal hernia was noted.  The examination was otherwise normal.  He was recommended to continue antiacid therapy with omeprazole shortly before breakfast and Pepcid at bedtime every night.   HPI: This is a very pleasant 70 year old man who was with his wife today  I last saw him about a year ago.  He is here today for a new problem  He had some lower abdominal pains, slight fever.  Presented to the emergency room about 2 months ago and was found to have likely mild uncomplicated sigmoid diverticulitis.  The fever improved after a single Tylenol, he was put on Augmentin and he completed the course.  He has felt completely back to normal since then.  He did have 2 days of slightly loose stools while on the antibiotics.  He never sees blood in his stool.  One of his uncles died of colon cancer.  He believes he had acute diverticulitis 20 or 25 years ago as well.  His weight is overall stable  Old Data Reviewed: CT scan abdomen pelvis with IV and oral contrast April 2022, indication "left lower quadrant pain" findings "indicative of mid to distal sigmoid diverticulitis.  No abscess or perforation in this area.  Note there is marked localized wall thickening of the distal sigmoid region.  An underlying neoplasm could present in this manner"  Blood work April 2022 white blood cell count 13.5, complete metabolic profile was normal except for total bilirubin 1.6  Blood work about 2 weeks later in April CBC was normal, white blood cell count had normalized complete metabolic profile was normal as well.  Emergency room visit 2 months ago for left  lower quadrant pain.  Imaging and blood tests as above.  He was given a prescription of Augmentin for acute diverticulitis     Review of systems: Pertinent positive and negative review of systems were noted in the above HPI section. All other review negative.   Past Medical History:  Diagnosis Date  . Adenomatous polyp of colon   . Allergy    seasonal  . Arthritis   . BPH (benign prostatic hyperplasia)   . Diverticulosis 09/2010   episode of diverticulitis  . GERD (gastroesophageal reflux disease)   . Hyperlipidemia   . Hypertension   . Low back pain     Past Surgical History:  Procedure Laterality Date  . ANKLE FRACTURE SURGERY     chronic pain as result  . COLONOSCOPY    . FINGER SURGERY Left    bite by a crab  . HERNIA REPAIR     jan 2012, [facility]    Current Outpatient Medications  Medication Sig Dispense Refill  . amLODipine-benazepril (LOTREL) 5-20 MG capsule TAKE ONE CAPSULE BY MOUTH DAILY 90 capsule 2  . atorvastatin (LIPITOR) 40 MG tablet TAKE ONE TABLET BY MOUTH DAILY 90 tablet 2  . Azelastine HCl 0.15 % SOLN Place 1 spray into the nose as needed.    . Cholecalciferol (VITAMIN D3 MAXIMUM STRENGTH) 125 MCG (5000 UT) capsule Take 1 capsule (5,000 Units total) by mouth daily. 90 capsule 3  . famotidine (PEPCID) 20 MG tablet Take 1 tablet (20 mg total) by mouth at bedtime.    . finasteride (  PROSCAR) 5 MG tablet TAKE ONE TABLET BY MOUTH DAILY 90 tablet 3  . fluticasone (FLONASE) 50 MCG/ACT nasal spray SPRAY TWO SPRAYS IN EACH NOSTRIL ONCE DAILY 48 g 1  . meloxicam (MOBIC) 7.5 MG tablet TAKE ONE TABLET BY MOUTH DAILY 90 tablet 1  . omeprazole (PRILOSEC) 20 MG capsule Take 1 capsule 20-30 minutes prior to breakfast. 90 capsule 3   No current facility-administered medications for this visit.    Allergies as of 06/15/2020 - Review Complete 06/15/2020  Allergen Reaction Noted  . Dust mite mixed allergen ext [mite (d. farinae)]  12/21/2019    Family History   Problem Relation Age of Onset  . Cancer Father        melanoma, cousin also died of it  . Cancer Brother        melanoma  . Colon polyps Sister   . Cancer Paternal Uncle        Colon cancer  . Colon cancer Paternal Uncle   . Esophageal cancer Neg Hx   . Stomach cancer Neg Hx     Social History   Socioeconomic History  . Marital status: Married    Spouse name: Not on file  . Number of children: Not on file  . Years of education: Not on file  . Highest education level: Not on file  Occupational History  . Occupation: retired  Tobacco Use  . Smoking status: Former Smoker    Packs/day: 0.25    Years: 10.00    Pack years: 2.50    Types: Cigarettes    Quit date: 03/23/1990    Years since quitting: 30.2  . Smokeless tobacco: Never Used  . Tobacco comment: quit in 1992  Vaping Use  . Vaping Use: Never used  Substance and Sexual Activity  . Alcohol use: Yes    Alcohol/week: 6.0 standard drinks    Types: 6 Cans of beer per week  . Drug use: No  . Sexual activity: Yes  Other Topics Concern  . Not on file  Social History Narrative   Married >30 years in 2016. No children. 2 dogs.       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 Difficulty of Paying Living Expenses: Not hard at all  Food Insecurity: No Food Insecurity  . Worried About Charity fundraiser in the Last Year: Never true  . Ran Out of Food in the Last Year: Never true  Transportation Needs: No Transportation Needs  . Lack of Transportation (Medical): No  . Lack of Transportation (Non-Medical): No  Physical Activity: Inactive  . Days of Exercise per Week: 0 days  . Minutes of Exercise per Session: 0 min  Stress: No Stress Concern Present  . Feeling of Stress : Not at all  Social Connections: Moderately Isolated  . Frequency of Communication with Friends and Family: More than three times a  week  . Frequency of Social Gatherings with Friends and Family: Three times a week  . Attends Religious Services: Never  . Active Member of Clubs or Organizations: No  . Attends Archivist Meetings: Never  . Marital Status: Married  Human resources officer Violence: Not At Risk  . Fear of Current or Ex-Partner: No  . Emotionally Abused: No  . Physically Abused: No  . Sexually Abused: No     Physical Exam: BP  120/70 (BP Location: Left Arm, Patient Position: Sitting, Cuff Size: Normal)   Pulse 80   Ht 5\' 5"  (1.651 m) Comment: height measured without shoes  Wt 197 lb 4 oz (89.5 kg)   BMI 32.82 kg/m  Constitutional: generally well-appearing Psychiatric: alert and oriented x3 Eyes: extraocular movements intact Mouth: oral pharynx moist, no lesions Neck: supple no lymphadenopathy Cardiovascular: heart regular rate and rhythm Lungs: clear to auscultation bilaterally Abdomen: soft, nontender, nondistended, no obvious ascites, no peritoneal signs, normal bowel sounds Extremities: no lower extremity edema bilaterally Skin: no lesions on visible extremities   Assessment and plan: 70 y.o. male with personal history of polyps, recent sigmoid diverticulitis  I explained to him that I think it is very very likely that indeed he had mild sigmoid diverticulitis.  We did go into some discussion about the fact that colon cancer can mimic diverticulitis is such as his and given his specific findings on CT as well as the fact that his last colonoscopy was 4 years ago I recommend repeat colonoscopy at his soonest convenience.  I see no reason for blood tests or imaging studies prior to then.   Please see the "Patient Instructions" section for addition details about the plan.   Owens Loffler, MD [facility] 06/15/2020, 9:52 AM  Cc: Marin Olp, MD  Total time on date of encounter was 45  minutes (this included time spent preparing to see the patient reviewing records;  obtaining and/or reviewing separately obtained history; performing a medically appropriate exam and/or evaluation; counseling and educating the patient and family if present; ordering medications, tests or procedures if applicable; and documenting clinical information in the health record).

## 2020-06-15 NOTE — Patient Instructions (Addendum)
If you are age 70 or older, your body mass index should be between 23-30. Your Body mass index is 32.82 kg/m. If this is out of the aforementioned range listed, please consider follow up with your Primary Care Provider.  __________________________________________________________  The [facility] providers would like to encourage you to use [facility] to communicate with providers for non-urgent requests or questions.  Due to long hold times on the telephone, sending your provider a message by [facility] may be a faster and more efficient way to get a response.  Please allow 48 business hours for a response.  Please remember that this is for non-urgent requests.   You have been scheduled for a colonoscopy. Please follow written instructions given to you at your visit today.  Please pick up your prep supplies at the pharmacy within the next 1-3 days. If you use inhalers (even only as needed), please bring them with you on the day of your procedure.  Due to recent changes in healthcare laws, you may see the results of your imaging and laboratory studies on MyChart before your provider has had a chance to review them.  We understand that in some cases there may be results that are confusing or concerning to you. Not all laboratory results come back in the same time frame and the provider may be waiting for multiple results in order to interpret others.  Please give [place] 48 hours in order for your provider to thoroughly review all the results before contacting the office for clarification of your results.   Thank you for entrusting me with your care and choosing [facility].  Dr Ardis Hughs

## 2020-07-20 ENCOUNTER — Other Ambulatory Visit: Payer: Self-pay

## 2020-07-20 ENCOUNTER — Ambulatory Visit: Payer: Medicare PPO | Admitting: Family Medicine

## 2020-07-20 ENCOUNTER — Encounter: Payer: Self-pay | Admitting: Family Medicine

## 2020-07-20 VITALS — BP 128/74 | HR 68 | Temp 98.5°F | Ht 65.0 in | Wt 196.6 lb

## 2020-07-20 DIAGNOSIS — M19071 Primary osteoarthritis, right ankle and foot: Secondary | ICD-10-CM

## 2020-07-20 DIAGNOSIS — I10 Essential (primary) hypertension: Secondary | ICD-10-CM | POA: Diagnosis not present

## 2020-07-20 DIAGNOSIS — E785 Hyperlipidemia, unspecified: Secondary | ICD-10-CM

## 2020-07-20 DIAGNOSIS — I700 Atherosclerosis of aorta: Secondary | ICD-10-CM

## 2020-07-20 LAB — CBC WITH DIFFERENTIAL/PLATELET
Basophils Absolute: 0.1 10*3/uL (ref 0.0–0.1)
Basophils Relative: 0.8 % (ref 0.0–3.0)
Eosinophils Absolute: 0.1 10*3/uL (ref 0.0–0.7)
Eosinophils Relative: 1.3 % (ref 0.0–5.0)
HCT: 41.8 % (ref 39.0–52.0)
Hemoglobin: 14.6 g/dL (ref 13.0–17.0)
Lymphocytes Relative: 18.6 % (ref 12.0–46.0)
Lymphs Abs: 1.5 10*3/uL (ref 0.7–4.0)
MCHC: 35 g/dL (ref 30.0–36.0)
MCV: 85 fl (ref 78.0–100.0)
Monocytes Absolute: 0.6 10*3/uL (ref 0.1–1.0)
Monocytes Relative: 6.8 % (ref 3.0–12.0)
Neutro Abs: 6 10*3/uL (ref 1.4–7.7)
Neutrophils Relative %: 72.5 % (ref 43.0–77.0)
Platelets: 218 10*3/uL (ref 150.0–400.0)
RBC: 4.92 Mil/uL (ref 4.22–5.81)
RDW: 13.1 % (ref 11.5–15.5)
WBC: 8.3 10*3/uL (ref 4.0–10.5)

## 2020-07-20 LAB — COMPREHENSIVE METABOLIC PANEL
ALT: 15 U/L (ref 0–53)
AST: 15 U/L (ref 0–37)
Albumin: 4.2 g/dL (ref 3.5–5.2)
Alkaline Phosphatase: 94 U/L (ref 39–117)
BUN: 14 mg/dL (ref 6–23)
CO2: 25 mEq/L (ref 19–32)
Calcium: 9.2 mg/dL (ref 8.4–10.5)
Chloride: 105 mEq/L (ref 96–112)
Creatinine, Ser: 0.85 mg/dL (ref 0.40–1.50)
GFR: 88.07 mL/min (ref >60.00)
Glucose, Bld: 125 mg/dL — ABNORMAL HIGH (ref 70–99)
Potassium: 3.6 mEq/L (ref 3.5–5.1)
Sodium: 138 mEq/L (ref 135–145)
Total Bilirubin: 0.8 mg/dL (ref 0.2–1.2)
Total Protein: 6.8 g/dL (ref 6.0–8.3)

## 2020-07-20 NOTE — Progress Notes (Signed)
Phone [phone] In person visit   Subjective:   Matthew Watson is a 70 y.o. year old very pleasant male patient who presents for/with See problem oriented charting Chief Complaint  Patient presents with   Hypertension   Hyperlipidemia    This visit occurred during the SARS-CoV-2 public health emergency.  Safety protocols were in place, including screening questions prior to the visit, additional usage of staff PPE, and extensive cleaning of exam room while observing appropriate contact time as indicated for disinfecting solutions.   Past Medical History-  Patient Active Problem List   Diagnosis Date Noted   Chronic narcotic use 10/10/2016    Priority: High   Spinal stenosis 05/11/2020    Priority: Medium   Aortic atherosclerosis ([place]) 05/11/2020    Priority: Medium   Arthritis of right ankle 02/11/2014    Priority: Medium   BPH (benign prostatic hyperplasia) 12/17/2006    Priority: Medium   Hyperlipidemia 08/19/2006    Priority: Medium   Essential hypertension 08/19/2006    Priority: Medium   Allergic rhinitis 02/11/2014    Priority: Low   Family history of melanoma 02/11/2014    Priority: Low   GERD (gastroesophageal reflux disease) 02/11/2014    Priority: Low   Former smoker 02/11/2014    Priority: Low   INSOMNIA, CHRONIC 07/01/2009    Priority: Low   Actinic keratosis 12/17/2006    Priority: Low   History of colonic polyps 08/26/2006    Priority: Low   Hiatal hernia 01/19/2020   Pyogenic granuloma 08/21/2019   Mass of left finger 08/13/2019   Arthritis of left foot 08/27/2016    Medications- reviewed and updated Current Outpatient Medications  Medication Sig Dispense Refill   amLODipine-benazepril (LOTREL) 5-20 MG capsule TAKE ONE CAPSULE BY MOUTH DAILY 90 capsule 2   atorvastatin (LIPITOR) 40 MG tablet TAKE ONE TABLET BY MOUTH DAILY 90 tablet 2   Azelastine HCl 0.15 % SOLN Place 1 spray into the nose as needed.     Cholecalciferol (VITAMIN D3  MAXIMUM STRENGTH) 125 MCG (5000 UT) capsule Take 1 capsule (5,000 Units total) by mouth daily. 90 capsule 3   Cyanocobalamin (VITAMIN B-12 PO) Take by mouth. Once weekly.     famotidine (PEPCID) 20 MG tablet Take 1 tablet (20 mg total) by mouth at bedtime.     finasteride (PROSCAR) 5 MG tablet TAKE ONE TABLET BY MOUTH DAILY 90 tablet 3   fluticasone (FLONASE) 50 MCG/ACT nasal spray SPRAY TWO SPRAYS IN EACH NOSTRIL ONCE DAILY 48 g 1   meloxicam (MOBIC) 7.5 MG tablet TAKE ONE TABLET BY MOUTH DAILY 90 tablet 1   omeprazole (PRILOSEC) 20 MG capsule Take 1 capsule 20-30 minutes prior to breakfast. 90 capsule 3   Na Sulfate-K Sulfate-Mg Sulf (SUPREP BOWEL PREP KIT) 17.5-3.13-1.6 GM/177ML SOLN Take 1 kit by mouth as directed. 324 mL 0   No current facility-administered medications for this visit.     Objective:  BP 128/74   Pulse 68   Temp 98.5 F (36.9 C) (Temporal)   Ht 5' 5"  (1.651 m)   Wt 196 lb 9.6 oz (89.2 kg)   SpO2 98%   BMI 32.72 kg/m  Gen: NAD, resting comfortably CV: RRR no murmurs rubs or gallops Lungs: CTAB no crackles, wheeze, rhonchi Abdomen: soft/nontender/nondistended/normal bowel sounds.  Ext: no edema Skin: warm, dry    Assessment and Plan   #social update- working in garden, doing some canning right now. Teddy Spike worth of this- 3 people to prep  this  #History of diverticulitis-thankfully no recurrence since early April.  Colonoscopy scheduled for august  #hypertension S: medication: amlodipine- benazepril 5-20 mg Home readings #s: 130/70s BP Readings from Last 3 Encounters:  07/20/20 128/74  06/15/20 120/70  05/11/20 134/80  A/P: Stable/controlled on repeat. Continue current medications.   #hyperlipidemia #Aortic atherosclerosis/coronary artery calcium S: Medication:Atorvastatin 40 mg.  LDL has been slightly high but patient is wanted to work on healthy eating/regular exercise -eating more veggies since diverticulitis -doing some walking- thinks it would help  if had a dog Lab Results  Component Value Date   CHOL 153 01/19/2020   HDL 59.50 01/19/2020   [place] 79 01/19/2020   LDLDIRECT 90.0 02/11/2014   TRIG 76.0 01/19/2020   CHOLHDL 3 01/19/2020   A/P: With aortic atherosclerosis and coronary artery calcium prefer LDL under 70 and slightly above goal at 79 last check-we discussed repeating this at next visit-congratulated him on losing 3 pounds from physical in 5 pounds from April.  Mild poor control last check-hopefully improved by next visit-for now continue current medicine  #BPH-stable on finasteride  #Chronic pain-right ankle arthritis and spinal stenosis (no major issues- but was noted on ct for diverticulitis 05/11/20) -prior  chronic narcotics but now off since 2019-patient remains on chronic meloxicam 7.5 mg-we discussed importance of monitoring renal function at least every 6 months   Recommended follow up: Return in about 6 months (around 01/20/2021) for physical or sooner if needed. Future Appointments  Date Time Provider [place]  08/24/2020  1:30 PM Milus Banister, MD [facility]  01/02/2021  8:45 AM [facility] HEALTH COACH [facility]  01/20/2021  8:20 AM Yong Channel Brayton Mars, MD [facility]    Lab/Order associations:   ICD-10-CM   1. Hyperlipidemia, unspecified hyperlipidemia type  E78.5     2. Essential hypertension  I10 Comprehensive metabolic panel    CBC with Differential/Platelet    3. Aortic atherosclerosis (HCC)  I70.0     4. Arthritis of right ankle  M19.071      No orders of the defined types were placed in this encounter.   Return precautions advised.  Garret Reddish, MD

## 2020-07-20 NOTE — Patient Instructions (Addendum)
You are eligible to schedule your annual wellness visit with our nurse specialist Otila Kluver.  Please consider scheduling this before you leave today  Please stop by lab before you go If you have mychart- we will send your results within 3 business days of [place] receiving them.  If you do not have mychart- we will call you about results within 5 business days of [place] receiving them.  *please also note that you will see labs on mychart as soon as they post. I will later go in and write notes on them- will say "notes from Dr. Yong Channel"  No changes today unless labs lead [place] to make changes   Recommended follow up: Return in about 6 months (around 01/20/2021) for physical or sooner if needed.

## 2020-07-26 DIAGNOSIS — H10413 Chronic giant papillary conjunctivitis, bilateral: Secondary | ICD-10-CM | POA: Diagnosis not present

## 2020-07-26 DIAGNOSIS — H2513 Age-related nuclear cataract, bilateral: Secondary | ICD-10-CM | POA: Diagnosis not present

## 2020-07-26 DIAGNOSIS — H0100A Unspecified blepharitis right eye, upper and lower eyelids: Secondary | ICD-10-CM | POA: Diagnosis not present

## 2020-07-26 DIAGNOSIS — H40023 Open angle with borderline findings, high risk, bilateral: Secondary | ICD-10-CM | POA: Diagnosis not present

## 2020-08-18 ENCOUNTER — Other Ambulatory Visit: Payer: Self-pay | Admitting: Family Medicine

## 2020-08-24 ENCOUNTER — Ambulatory Visit (AMBULATORY_SURGERY_CENTER): Payer: Medicare PPO | Admitting: Gastroenterology

## 2020-08-24 ENCOUNTER — Other Ambulatory Visit: Payer: Self-pay

## 2020-08-24 ENCOUNTER — Encounter: Payer: Self-pay | Admitting: Gastroenterology

## 2020-08-24 VITALS — BP 131/77 | HR 68 | Temp 98.0°F | Resp 13 | Ht 65.0 in | Wt 197.0 lb

## 2020-08-24 DIAGNOSIS — Z86010 Personal history of colonic polyps: Secondary | ICD-10-CM

## 2020-08-24 DIAGNOSIS — K5140 Inflammatory polyps of colon without complications: Secondary | ICD-10-CM

## 2020-08-24 DIAGNOSIS — D123 Benign neoplasm of transverse colon: Secondary | ICD-10-CM | POA: Diagnosis not present

## 2020-08-24 MED ORDER — SODIUM CHLORIDE 0.9 % IV SOLN: 500.0000 mL | Freq: Once | INTRAVENOUS | Status: DC

## 2020-08-24 NOTE — Progress Notes (Signed)
PT taken to PACU. Monitors in place. VSS. Report given to RN. 

## 2020-08-24 NOTE — Progress Notes (Signed)
HPI: This is a with history of colon polyps, recent diverticulitis   ROS: complete GI ROS as described in HPI, all other review negative.  Constitutional:  No unintentional weight loss   Past Medical History:  Diagnosis Date   Adenomatous polyp of colon    Allergy    seasonal   Arthritis    BPH (benign prostatic hyperplasia)    Diverticulosis 09/2010   episode of diverticulitis   GERD (gastroesophageal reflux disease)    Hyperlipidemia    Hypertension    Low back pain     Past Surgical History:  Procedure Laterality Date   ANKLE FRACTURE SURGERY     chronic pain as result   COLONOSCOPY     FINGER SURGERY Left    bite by a crab   HERNIA REPAIR     jan 2012, [place]    Current Outpatient Medications  Medication Sig Dispense Refill   amLODipine-benazepril (LOTREL) 5-20 MG capsule TAKE ONE CAPSULE BY MOUTH DAILY 90 capsule 2   atorvastatin (LIPITOR) 40 MG tablet TAKE ONE TABLET BY MOUTH DAILY 90 tablet 2   Azelastine HCl 0.15 % SOLN Place 1 spray into the nose as needed.     Cholecalciferol (VITAMIN D3 MAXIMUM STRENGTH) 125 MCG (5000 UT) capsule Take 1 capsule (5,000 Units total) by mouth daily. 90 capsule 3   Cyanocobalamin (VITAMIN B-12 PO) Take by mouth. Once weekly.     famotidine (PEPCID) 20 MG tablet Take 1 tablet (20 mg total) by mouth at bedtime.     finasteride (PROSCAR) 5 MG tablet TAKE ONE TABLET BY MOUTH DAILY 90 tablet 3   meloxicam (MOBIC) 7.5 MG tablet TAKE ONE TABLET BY MOUTH DAILY 90 tablet 1   omeprazole (PRILOSEC) 20 MG capsule Take 1 capsule 20-30 minutes prior to breakfast. 90 capsule 3   fluticasone (FLONASE) 50 MCG/ACT nasal spray SPRAY TWO SPRAYS IN EACH NOSTRIL ONCE DAILY 48 g 1   Current Facility-Administered Medications  Medication Dose Route Frequency Provider Last Rate Last Admin   0.9 %  sodium chloride infusion  500 mL Intravenous Once Milus Banister, MD        Allergies as of 08/24/2020 - Review Complete 08/24/2020  Allergen Reaction  Noted   Dust mite mixed allergen ext [mite (d. farinae)]  12/21/2019    Family History  Problem Relation Age of Onset   Cancer Father        melanoma, cousin also died of it   Colon polyps Sister    Cancer Brother        melanoma   Cancer Paternal Uncle        Colon cancer   Colon cancer Paternal Uncle    Esophageal cancer Neg Hx    Stomach cancer Neg Hx    Rectal cancer Neg Hx     Social History   Socioeconomic History   Marital status: Married    Spouse name: Not on file   Number of children: Not on file   Years of education: Not on file   Highest education level: Not on file  Occupational History   Occupation: retired  Tobacco Use   Smoking status: Former    Packs/day: 0.25    Years: 10.00    Pack years: 2.50    Types: Cigarettes    Quit date: 03/23/1990    Years since quitting: 30.4   Smokeless tobacco: Never   Tobacco comments:    quit in 1992  Vaping Use   Vaping Use:  Never used  Substance and Sexual Activity   Alcohol use: Yes    Alcohol/week: 6.0 standard drinks    Types: 6 Cans of beer per week   Drug use: No   Sexual activity: Yes  Other Topics Concern   Not on file  Social History Narrative   Married >30 years in 2016. No children. 2 dogs.       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      Hobbies: Rabbit hunting, fishing, duck hunt   Social Determinants of Radio broadcast assistant Strain: Low Risk    Difficulty of Paying Living Expenses: Not hard at [employer] Insecurity: No Food Insecurity   Worried About Charity fundraiser in the Last Year: Never true   Arboriculturist in the Last Year: Never true  Transportation Needs: No Data processing manager (Medical): No   Lack of Transportation (Non-Medical): No  Physical Activity: Inactive   Days of Exercise per Week: 0 days   Minutes of Exercise per Session: 0 min  Stress: No Stress Concern Present   Feeling of Stress : Not at all  Social Connections: Moderately Isolated    Frequency of Communication with Friends and Family: More than three times a week   Frequency of Social Gatherings with Friends and Family: Three times a week   Attends Religious Services: Never   Active Member of Clubs or Organizations: No   Attends Archivist Meetings: Never   Marital Status: Married  Human resources officer Violence: Not At Risk   Fear of Current or Ex-Partner: No   Emotionally Abused: No   Physically Abused: No   Sexually Abused: No     Physical Exam: BP (!) 154/70 (BP Location: Right Arm, Patient Position: Sitting, Cuff Size: Normal)   Pulse 85   Temp 98 F (36.7 C) (Temporal)   Ht '5\' 5"'$  (1.651 m)   Wt 197 lb (89.4 kg)   SpO2 96%   BMI 32.78 kg/m  Constitutional: generally well-appearing Psychiatric: alert and oriented x3 Abdomen: soft, nontender, nondistended, no obvious ascites, no peritoneal signs, normal bowel sounds No peripheral edema noted in lower extremities  Assessment and plan: 70 y.o. male with history of colon polyps, recent diverticulitis  For colonoscopy today  Owens Loffler, MD [facility] 08/24/2020, 1:10 PM

## 2020-08-24 NOTE — Patient Instructions (Signed)
Handouts on polyps, diverticulosis, and hemorrhoids provided   YOU HAD AN ENDOSCOPIC PROCEDURE TODAY AT [facility]:   Refer to the procedure report that was given to you for any specific questions about what was found during the examination.  If the procedure report does not answer your questions, please call your gastroenterologist to clarify.  If you requested that your care partner not be given the details of your procedure findings, then the procedure report has been included in a sealed envelope for you to review at your convenience later.  YOU SHOULD EXPECT: Some feelings of bloating in the abdomen. Passage of more gas than usual.  Walking can help get rid of the air that was put into your GI tract during the procedure and reduce the bloating. If you had a lower endoscopy (such as a colonoscopy or flexible sigmoidoscopy) you may notice spotting of blood in your stool or on the toilet paper. If you underwent a bowel prep for your procedure, you may not have a normal bowel movement for a few days.  Please Note:  You might notice some irritation and congestion in your nose or some drainage.  This is from the oxygen used during your procedure.  There is no need for concern and it should clear up in a day or so.  SYMPTOMS TO REPORT IMMEDIATELY:  Following lower endoscopy (colonoscopy or flexible sigmoidoscopy):  Excessive amounts of blood in the stool  Significant tenderness or worsening of abdominal pains  Swelling of the abdomen that is new, acute  Fever of 100F or higher   For urgent or emergent issues, a gastroenterologist can be reached at any hour by calling [phone]. Do not use MyChart messaging for urgent concerns.    DIET:  We do recommend a small meal at first, but then you may proceed to your regular diet.  Drink plenty of fluids but you should avoid alcoholic beverages for 24 hours.  ACTIVITY:  You should plan to take it easy for the rest of today and you  should NOT DRIVE or use heavy machinery until tomorrow (because of the sedation medicines used during the test).    FOLLOW UP: Our staff will call the number listed on your records 48-72 hours following your procedure to check on you and address any questions or concerns that you may have regarding the information given to you following your procedure. If we do not reach you, we will leave a message.  We will attempt to reach you two times.  During this call, we will ask if you have developed any symptoms of COVID 19. If you develop any symptoms (ie: fever, flu-like symptoms, shortness of breath, cough etc.) before then, please call [phone].  If you test positive for Covid 19 in the 2 weeks post procedure, please call and report this information to [place].    If any biopsies were taken you will be contacted by phone or by letter within the next 1-3 weeks.  Please call us at [phone] if you have not heard about the biopsies in 3 weeks.    SIGNATURES/CONFIDENTIALITY: You and/or your care partner have signed paperwork which will be entered into your electronic medical record.  These signatures attest to the fact that that the information above on your After Visit Summary has been reviewed and is understood.  Full responsibility of the confidentiality of this discharge information lies with you and/or your care-partner.

## 2020-08-24 NOTE — Progress Notes (Signed)
Called to room to assist during endoscopic procedure.  Patient ID and intended procedure confirmed with present staff. Received instructions for my participation in the procedure from the performing physician.  

## 2020-08-24 NOTE — Op Note (Signed)
[facility] Patient Name: Matthew Watson Procedure Date: 08/24/2020 1:21 PM MRN: [id number] Endoscopist: Milus Banister , MD Age: 70 Referring MD:  Date of Birth: 1950-12-04 Gender: Male Account #: [id number] Procedure:                Colonoscopy Indications:              High risk colon cancer surveillance: Personal                            history of colonic polyps; recent diverticulitis Medicines:                Monitored Anesthesia Care Procedure:                Pre-Anesthesia Assessment:                           - Prior to the procedure, a History and Physical                            was performed, and patient medications and                            allergies were reviewed. The patient's tolerance of                            previous anesthesia was also reviewed. The risks                            and benefits of the procedure and the sedation                            options and risks were discussed with the patient.                            All questions were answered, and informed consent                            was obtained. Prior Anticoagulants: The patient has                            taken no previous anticoagulant or antiplatelet                            agents. ASA Grade Assessment: II - A patient with                            mild systemic disease. After reviewing the risks                            and benefits, the patient was deemed in                            satisfactory condition to undergo the procedure.  After obtaining informed consent, the colonoscope                            was passed under direct vision. Throughout the                            procedure, the patient's blood pressure, pulse, and                            oxygen saturations were monitored continuously. The                            CF [id number] was introduced through the anus                            and advanced  to the the cecum, identified by                            appendiceal orifice and ileocecal valve. The                            colonoscopy was performed without difficulty. The                            patient tolerated the procedure well. The quality                            of the bowel preparation was good. The ileocecal                            valve, appendiceal orifice, and rectum were                            photographed. Scope In: 1:29:04 PM Scope Out: 1:49:05 PM Scope Withdrawal Time: 0 hours 8 minutes 6 seconds  Total Procedure Duration: 0 hours 20 minutes 1 second  Findings:                 A 3 mm polyp was found in the transverse colon. The                            polyp was sessile. The polyp was removed with a                            cold snare. Resection and retrieval were complete.                           Multiple small and large-mouthed diverticula were                            found in the left colon. This was associated with                            significant edema, spasm and luminal  narrowing.                           External and internal hemorrhoids were found. The                            hemorrhoids were small.                           The exam was otherwise without abnormality on                            direct and retroflexion views. Complications:            No immediate complications. Estimated blood loss:                            None. Estimated Blood Loss:     Estimated blood loss: none. Impression:               - One 3 mm polyp in the transverse colon, removed                            with a cold snare. Resected and retrieved.                           - Multiple small and large-mouthed diverticula were                            found in the left colon. This was associated with                            significant edema, spasm and luminal narrowing.                           - External and internal hemorrhoids.                            - The examination was otherwise normal on direct                            and retroflexion views. Recommendation:           - Patient has a contact number available for                            emergencies. The signs and symptoms of potential                            delayed complications were discussed with the                            patient. Return to normal activities tomorrow.                            Written discharge instructions were provided to the  patient.                           - Resume previous diet.                           - Continue present medications.                           - Await pathology results. Milus Banister, MD 08/24/2020 1:54:00 PM This report has been signed electronically.

## 2020-08-24 NOTE — Progress Notes (Signed)
[facility]  History reviewed.

## 2020-08-26 ENCOUNTER — Telehealth: Payer: Self-pay | Admitting: *Deleted

## 2020-08-26 ENCOUNTER — Telehealth: Payer: Self-pay

## 2020-08-26 NOTE — Telephone Encounter (Signed)
Follow up call made. 

## 2020-08-26 NOTE — Telephone Encounter (Signed)
Second follow up call attempt, no answer, LM

## 2020-08-31 DIAGNOSIS — H1045 Other chronic allergic conjunctivitis: Secondary | ICD-10-CM | POA: Diagnosis not present

## 2020-08-31 DIAGNOSIS — K219 Gastro-esophageal reflux disease without esophagitis: Secondary | ICD-10-CM | POA: Diagnosis not present

## 2020-08-31 DIAGNOSIS — J3089 Other allergic rhinitis: Secondary | ICD-10-CM | POA: Diagnosis not present

## 2020-09-01 ENCOUNTER — Encounter: Payer: Self-pay | Admitting: Gastroenterology

## 2020-09-08 ENCOUNTER — Other Ambulatory Visit: Payer: Self-pay | Admitting: Gastroenterology

## 2020-09-08 ENCOUNTER — Other Ambulatory Visit: Payer: Self-pay | Admitting: Family Medicine

## 2020-10-10 ENCOUNTER — Encounter: Payer: Self-pay | Admitting: Family Medicine

## 2020-12-11 ENCOUNTER — Other Ambulatory Visit: Payer: Self-pay | Admitting: Family Medicine

## 2021-01-02 ENCOUNTER — Ambulatory Visit (INDEPENDENT_AMBULATORY_CARE_PROVIDER_SITE_OTHER): Payer: Medicare PPO

## 2021-01-02 ENCOUNTER — Other Ambulatory Visit: Payer: Self-pay

## 2021-01-02 VITALS — BP 130/78 | HR 74 | Temp 98.3°F | Wt 197.6 lb

## 2021-01-02 DIAGNOSIS — Z0000 Encounter for general adult medical examination without abnormal findings: Secondary | ICD-10-CM

## 2021-01-02 NOTE — Patient Instructions (Addendum)
[name] , Thank you for taking time to come for your Medicare Wellness Visit. I appreciate your ongoing commitment to your health goals. Please review the following plan we discussed and let me know if I can assist you in the future.   Screening recommendations/referrals: Colonoscopy: Done 08/24/20 repeat every 10 years  Recommended yearly ophthalmology/optometry visit for glaucoma screening and checkup Recommended yearly dental visit for hygiene and checkup  Vaccinations: Influenza vaccine: Up to date 11/03/20 Pneumococcal vaccine: Up to date Tdap vaccine: Done 9/268/18 repeat every 10 years  Shingles vaccine: Completed 5/7, & 08/13/17   Covid-19: Completed 2/5, 3/2, 8/17,10/16/19 & 04/29/20  Advanced directives: Please bring a copy of your health care power of attorney and living will to the office at your convenience.  Conditions/risks identified: Lose weight   Next appointment: Follow up in one year for your annual wellness visit.   Preventive Care 43 Years and Older, Male Preventive care refers to lifestyle choices and visits with your health care provider that can promote health and wellness. What does preventive care include? A yearly physical exam. This is also called an annual well check. Dental exams once or twice a year. Routine eye exams. Ask your health care provider how often you should have your eyes checked. Personal lifestyle choices, including: Daily care of your teeth and gums. Regular physical activity. Eating a healthy diet. Avoiding tobacco and drug use. Limiting alcohol use. Practicing safe sex. Taking low doses of aspirin every day. Taking vitamin and mineral supplements as recommended by your health care provider. What happens during an annual well check? The services and screenings done by your health care provider during your annual well check will depend on your age, overall health, lifestyle risk factors, and family history of disease. Counseling   Your health care provider may ask you questions about your: Alcohol use. Tobacco use. Drug use. Emotional well-being. Home and relationship well-being. Sexual activity. Eating habits. History of falls. Memory and ability to understand (cognition). Work and work Statistician. Screening  You may have the following tests or measurements: Height, weight, and BMI. Blood pressure. Lipid and cholesterol levels. These may be checked every 5 years, or more frequently if you are over 5 years old. Skin check. Lung cancer screening. You may have this screening every year starting at age 44 if you have a 30-pack-year history of smoking and currently smoke or have quit within the past 15 years. Fecal occult blood test (FOBT) of the stool. You may have this test every year starting at age 57. Flexible sigmoidoscopy or colonoscopy. You may have a sigmoidoscopy every 5 years or a colonoscopy every 10 years starting at age 78. Prostate cancer screening. Recommendations will vary depending on your family history and other risks. Hepatitis C blood test. Hepatitis B blood test. Sexually transmitted disease (STD) testing. Diabetes screening. This is done by checking your blood sugar (glucose) after you have not eaten for a while (fasting). You may have this done every 1-3 years. Abdominal aortic aneurysm (AAA) screening. You may need this if you are a current or former smoker. Osteoporosis. You may be screened starting at age 62 if you are at high risk. Talk with your health care provider about your test results, treatment options, and if necessary, the need for more tests. Vaccines  Your health care provider may recommend certain vaccines, such as: Influenza vaccine. This is recommended every year. Tetanus, diphtheria, and acellular pertussis (Tdap, Td) vaccine. You may need a Td booster every  10 years. Zoster vaccine. You may need this after age 56. Pneumococcal 13-valent conjugate (PCV13) vaccine.  One dose is recommended after age 73. Pneumococcal polysaccharide (PPSV23) vaccine. One dose is recommended after age 39. Talk to your health care provider about which screenings and vaccines you need and how often you need them. This information is not intended to replace advice given to you by your health care provider. Make sure you discuss any questions you have with your health care provider. Document Released: 01/28/2015 Document Revised: 09/21/2015 Document Reviewed: 11/02/2014 Elsevier Interactive Patient Education  2017 [place] Prevention in the Home Falls can cause injuries. They can happen to people of all ages. There are many things you can do to make your home safe and to help prevent falls. What can I do on the outside of my home? Regularly fix the edges of walkways and driveways and fix any cracks. Remove anything that might make you trip as you walk through a door, such as a raised step or threshold. Trim any bushes or trees on the path to your home. Use bright outdoor lighting. Clear any walking paths of anything that might make someone trip, such as rocks or tools. Regularly check to see if handrails are loose or broken. Make sure that both sides of any steps have handrails. Any raised decks and porches should have guardrails on the edges. Have any leaves, snow, or ice cleared regularly. Use sand or salt on walking paths during winter. Clean up any spills in your garage right away. This includes oil or grease spills. What can I do in the bathroom? Use night lights. Install grab bars by the toilet and in the tub and shower. Do not use towel bars as grab bars. Use non-skid mats or decals in the tub or shower. If you need to sit down in the shower, use a plastic, non-slip stool. Keep the floor dry. Clean up any water that spills on the floor as soon as it happens. Remove soap buildup in the tub or shower regularly. Attach bath mats securely with double-sided  non-slip rug tape. Do not have throw rugs and other things on the floor that can make you trip. What can I do in the bedroom? Use night lights. Make sure that you have a light by your bed that is easy to reach. Do not use any sheets or blankets that are too big for your bed. They should not hang down onto the floor. Have a firm chair that has side arms. You can use this for support while you get dressed. Do not have throw rugs and other things on the floor that can make you trip. What can I do in the kitchen? Clean up any spills right away. Avoid walking on wet floors. Keep items that you use a lot in easy-to-reach places. If you need to reach something above you, use a strong step stool that has a grab bar. Keep electrical cords out of the way. Do not use floor polish or wax that makes floors slippery. If you must use wax, use non-skid floor wax. Do not have throw rugs and other things on the floor that can make you trip. What can I do with my stairs? Do not leave any items on the stairs. Make sure that there are handrails on both sides of the stairs and use them. Fix handrails that are broken or loose. Make sure that handrails are as long as the stairways. Check any carpeting to make  sure that it is firmly attached to the stairs. Fix any carpet that is loose or worn. Avoid having throw rugs at the top or bottom of the stairs. If you do have throw rugs, attach them to the floor with carpet tape. Make sure that you have a light switch at the top of the stairs and the bottom of the stairs. If you do not have them, ask someone to add them for you. What else can I do to help prevent falls? Wear shoes that: Do not have high heels. Have rubber bottoms. Are comfortable and fit you well. Are closed at the toe. Do not wear sandals. If you use a stepladder: Make sure that it is fully opened. Do not climb a closed stepladder. Make sure that both sides of the stepladder are locked into place. Ask  someone to hold it for you, if possible. Clearly mark and make sure that you can see: Any grab bars or handrails. First and last steps. Where the edge of each step is. Use tools that help you move around (mobility aids) if they are needed. These include: Canes. Walkers. Scooters. Crutches. Turn on the lights when you go into a dark area. Replace any light bulbs as soon as they burn out. Set up your furniture so you have a clear path. Avoid moving your furniture around. If any of your floors are uneven, fix them. If there are any pets around you, be aware of where they are. Review your medicines with your doctor. Some medicines can make you feel dizzy. This can increase your chance of falling. Ask your doctor what other things that you can do to help prevent falls. This information is not intended to replace advice given to you by your health care provider. Make sure you discuss any questions you have with your health care provider. Document Released: 10/28/2008 Document Revised: 06/09/2015 Document Reviewed: 02/05/2014 Elsevier Interactive Patient Education  2017 [employer].

## 2021-01-02 NOTE — Progress Notes (Addendum)
Subjective:   Matthew Watson is a 70 y.o. male who presents for Medicare Annual/Subsequent preventive examination.  Review of Systems     Cardiac Risk Factors include: advanced age (>87men, >68 women);hypertension;dyslipidemia;male gender;obesity (BMI >30kg/m2)     Objective:    Today's Vitals   01/02/21 0848  BP: 130/78  Pulse: 74  Temp: 98.3 F (36.8 C)  SpO2: 99%  Weight: 197 lb 9.6 oz (89.6 kg)  PainSc: 5    Body mass index is 32.88 kg/m.  Advanced Directives 01/02/2021 04/23/2020 03/01/2020 12/21/2019 09/08/2019 10/31/2018 01/03/2017  Does Patient Have a Medical Advance Directive? Yes Yes Yes Yes Yes Yes Yes  Type of Paramedic of [place];Living will Living will;Healthcare Power of Attorney Living will;Healthcare Power of [place];Living will Living will;Healthcare Power of Attorney -  Does patient want to make changes to medical advance directive? - No - Patient declined No - Patient declined - No - Patient declined No - Patient declined -  Copy of [place] in Chart? No - copy requested No - copy requested No - copy requested No - copy requested No - copy requested No - copy requested -    Current Medications (verified) Outpatient Encounter Medications as of 01/02/2021  Medication Sig   amLODipine-benazepril (LOTREL) 5-20 MG capsule TAKE ONE CAPSULE BY MOUTH DAILY   atorvastatin (LIPITOR) 40 MG tablet TAKE ONE TABLET BY MOUTH DAILY   Azelastine HCl 0.15 % SOLN Place 1 spray into the nose as needed.   Cholecalciferol (VITAMIN D3 MAXIMUM STRENGTH) 125 MCG (5000 UT) capsule Take 1 capsule (5,000 Units total) by mouth daily.   Cyanocobalamin (VITAMIN B-12 PO) Take by mouth. Once weekly.   famotidine (PEPCID) 20 MG tablet Take 1 tablet (20 mg total) by mouth at bedtime.   finasteride (PROSCAR) 5 MG tablet TAKE ONE TABLET BY MOUTH DAILY   fluticasone (FLONASE) 50 MCG/ACT  nasal spray SPRAY TWO SPRAYS IN EACH NOSTRIL ONCE DAILY   meloxicam (MOBIC) 7.5 MG tablet TAKE ONE TABLET BY MOUTH DAILY   omeprazole (PRILOSEC) 20 MG capsule TAKE 1 CAPSULE 20-30 MINUITES PRIOR TO BREAKFAST   No facility-administered encounter medications on file as of 01/02/2021.    Allergies (verified) Dust mite mixed allergen ext [mite (d. farinae)]   History: Past Medical History:  Diagnosis Date   Adenomatous polyp of colon    Allergy    seasonal   Arthritis    BPH (benign prostatic hyperplasia)    Diverticulosis 09/2010   episode of diverticulitis   GERD (gastroesophageal reflux disease)    Hyperlipidemia    Hypertension    Low back pain    Past Surgical History:  Procedure Laterality Date   ANKLE FRACTURE SURGERY     chronic pain as result   COLONOSCOPY     FINGER SURGERY Left    bite by a crab   HERNIA REPAIR     jan 2012, [facility]   Family History  Problem Relation Age of Onset   Cancer Father        melanoma, cousin also died of it   Colon polyps Sister    Cancer Brother        melanoma   Cancer Paternal Uncle        Colon cancer   Colon cancer Paternal Uncle    Esophageal cancer Neg Hx    Stomach cancer Neg Hx    Rectal cancer Neg Hx  Social History   Socioeconomic History   Marital status: Married    Spouse name: Not on file   Number of children: Not on file   Years of education: Not on file   Highest education level: Not on file  Occupational History   Occupation: retired  Tobacco Use   Smoking status: Former    Packs/day: 0.25    Years: 10.00    Pack years: 2.50    Types: Cigarettes    Quit date: 03/23/1990    Years since quitting: 30.8   Smokeless tobacco: Never   Tobacco comments:    quit in 1992  Vaping Use   Vaping Use: Never used  Substance and Sexual Activity   Alcohol use: Yes    Alcohol/week: 6.0 standard drinks    Types: 6 Cans of beer per week   Drug use: No   Sexual activity: Yes  Other Topics Concern   Not on file   Social History Narrative   Married >30 years in 2016. No children. 2 dogs.       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      Hobbies: Rabbit hunting, fishing, duck hunt   Social Determinants of Radio broadcast assistant Strain: Low Risk    Difficulty of Paying Living Expenses: Not hard at [employer] Insecurity: No Food Insecurity   Worried About Charity fundraiser in the Last Year: Never true   Arboriculturist in the Last Year: Never true  Transportation Needs: No Data processing manager (Medical): No   Lack of Transportation (Non-Medical): No  Physical Activity: Inactive   Days of Exercise per Week: 0 days   Minutes of Exercise per Session: 0 min  Stress: No Stress Concern Present   Feeling of Stress : Not at all  Social Connections: Moderately Isolated   Frequency of Communication with Friends and Family: More than three times a week   Frequency of Social Gatherings with Friends and Family: More than three times a week   Attends Religious Services: Never   Marine scientist or Organizations: No   Attends Music therapist: Never   Marital Status: Married    Tobacco Counseling Counseling given: Not Answered Tobacco comments: quit in 1992   Clinical Intake:  Pre-visit preparation completed: Yes  Pain : 0-10 Pain Score: 5  Pain Type: Chronic pain Pain Location: Back (and lower right ankle) Pain Descriptors / Indicators: Aching, Sore (arthritis) Pain Onset: More than a month ago Pain Frequency: Intermittent     BMI - recorded: 32.88 Nutritional Status: BMI > 30  Obese Nutritional Risks: None Diabetes: No  How often do you need to have someone help you when you read instructions, pamphlets, or other written materials from your doctor or pharmacy?: 1 - Never  Diabetic?no   Interpreter Needed?: No  Information entered by :: Charlott Rakes, LPN   Activities of Daily Living In your present state of health, do you have  any difficulty performing the following activities: 01/02/2021 01/19/2020  Hearing? Y N  Comment slight -  Vision? N N  Difficulty concentrating or making decisions? N N  Walking or climbing stairs? Y Y  Comment avoid -  Dressing or bathing? N N  Doing errands, shopping? N N  Preparing Food and eating ? N -  Using the Toilet? N -  In the past six months, have you accidently leaked urine? N -  Do you have problems  with loss of bowel control? N -  Managing your Medications? N -  Managing your Finances? N -  Housekeeping or managing your Housekeeping? N -  Some recent data might be hidden    Patient Care Team: Marin Olp, MD as PCP - General (Family Medicine) Wylene Simmer, MD as Consulting Physician (Orthopedic Surgery) Harold Hedge, Darrick Grinder, MD as Consulting Physician (Allergy and Immunology) Dermatology, [facility] as Consulting Physician Jerline Pain Mingo Amber, DO as Consulting Physician (Ophthalmology) Daleen Bo, PT as Physical Therapist (Physical Therapy)  Indicate any recent Medical Services you may have received from other than [facility] providers in the past year (date may be approximate).     Assessment:   This is a routine wellness examination for Matthew Watson.  Hearing/Vision screen Hearing Screening - Comments:: Pt has slight loss stated  Vision Screening - Comments:: Pt follows up with Dr Jerline Pain for annual eye exams at [facility]   Dietary issues and exercise activities discussed: Current Exercise Habits: The patient does not participate in regular exercise at present   Goals Addressed             This Visit's Progress    Patient Stated       Lose weight        Depression Screen PHQ 2/9 Scores 01/02/2021 07/20/2020 01/19/2020 12/21/2019 07/14/2019 01/05/2019 10/31/2018  PHQ - 2 Score 0 0 0 0 0 0 0    Fall Risk Fall Risk  01/02/2021 07/20/2020 12/21/2019 01/05/2019 10/31/2018  Falls in the past year? 0 0 0 0 1  Number falls in past yr: 0 0 0 0 -  Comment - - - - -   Injury with Fall? 0 0 0 0 0  Risk for fall due to : Impaired vision;Impaired balance/gait No Fall Risks Impaired vision;Impaired balance/gait - -  Follow up Falls prevention discussed Falls evaluation completed Falls prevention discussed - Education provided;Falls prevention discussed;Falls evaluation completed    FALL RISK PREVENTION PERTAINING TO THE HOME:  Any stairs in or around the home? No  If so, are there any without handrails? No  Home free of loose throw rugs in walkways, pet beds, electrical cords, etc? Yes  Adequate lighting in your home to reduce risk of falls? Yes   ASSISTIVE DEVICES UTILIZED TO PREVENT FALLS:  Life alert? No  Use of a cane, walker or w/c? Yes  Grab bars in the bathroom? Yes  Shower chair or bench in shower? Yes  Elevated toilet seat or a handicapped toilet? No   TIMED UP AND GO:  Was the test performed? Yes .  Length of time to ambulate 10 feet: 10 sec.   Gait slow and steady without use of assistive device  Cognitive Function: MMSE - Mini Mental State Exam 01/03/2017  Not completed: (No Data)     6CIT Screen 01/02/2021 12/21/2019 10/31/2018  What Year? 0 points 0 points 0 points  What month? 0 points 0 points 0 points  What time? 0 points - 0 points  Count back from 20 0 points 0 points 0 points  Months in reverse 0 points 0 points 0 points  Repeat phrase 0 points 0 points 0 points  Total Score 0 - 0    Immunizations Immunization History  Administered Date(s) Administered   Fluad Quad(high Dose 65+) 10/14/2018   Influenza Split 11/21/2011   Influenza Whole 11/18/2008, 01/11/2010   Influenza, High Dose Seasonal PF 12/20/2015, 11/19/2016, 02/01/2017, 11/19/2017   Influenza,inj,Quad PF,6+ Mos 11/11/2012, 11/18/2013, 12/07/2014  Influenza-Unspecified 10/26/2019, 11/03/2020   PFIZER(Purple Top)SARS-COV-2 Vaccination 02/20/2019, 03/17/2019, 09/01/2019, 10/16/2019, 04/29/2020   Pneumococcal Conjugate-13 02/15/2015   Pneumococcal  Polysaccharide-23 02/16/2016, 02/01/2017, 09/01/2019   Td 01/15/2005   Tdap 10/10/2016   Zoster Recombinat (Shingrix) 05/21/2017, 08/13/2017   Zoster, Live 07/14/2012    TDAP status: Up to date  Flu Vaccine status: Up to date  Pneumococcal vaccine status: Up to date  Covid-19 vaccine status: Completed vaccines  Qualifies for Shingles Vaccine? Yes   Zostavax completed Yes   Shingrix Completed?: Yes  Screening Tests Health Maintenance  Topic Date Due   COVID-19 Vaccine (6 - Booster for Pfizer series) 06/24/2020   TETANUS/TDAP  10/11/2026   COLONOSCOPY (Pts 45-45yrs Insurance coverage will need to be confirmed)  08/25/2030   Pneumonia Vaccine 16+ Years old  Completed   INFLUENZA VACCINE  Completed   Zoster Vaccines- Shingrix  Completed   HPV VACCINES  Aged Out    Health Maintenance  Health Maintenance Due  Topic Date Due   COVID-19 Vaccine (6 - Booster for [place] series) 06/24/2020    Colorectal cancer screening: Type of screening: Colonoscopy. Completed 08/24/20. Repeat every 10 years  Additional Screening:  Hepatitis C Screening: does not qualify  Vision Screening: Recommended annual ophthalmology exams for early detection of glaucoma and other disorders of the eye. Is the patient up to date with their annual eye exam?  Yes  Who is the provider or what is the name of the office in which the patient attends annual eye exams? Dr Jerline Pain at [facility]  If pt is not established with a provider, would they like to be referred to a provider to establish care? No .   Dental Screening: Recommended annual dental exams for proper oral hygiene  Community Resource Referral / Chronic Care Management: CRR required this visit?  No   CCM required this visit?  No      Plan:     I have personally reviewed and noted the following in the patients chart:   Medical and social history Use of alcohol, tobacco or illicit drugs  Current medications and supplements including opioid  prescriptions. Patient is not currently taking opioid prescriptions. Functional ability and status Nutritional status Physical activity Advanced directives List of other physicians Hospitalizations, surgeries, and ER visits in previous 12 months Vitals Screenings to include cognitive, depression, and falls Referrals and appointments  In addition, I have reviewed and discussed with patient certain preventive protocols, quality metrics, and best practice recommendations. A written personalized care plan for preventive services as well as general preventive health recommendations were provided to patient.     Willette Brace, LPN   [id number]   Nurse Notes: None

## 2021-01-04 NOTE — Progress Notes (Signed)
Phone: [phone]   Subjective:  Patient presents today for their annual physical. Chief complaint-noted.   See problem oriented charting- ROS- full  review of systems was completed and negative  except for: chronic ankle pain on right- swells intermittently- still wears orthotics Dr. Paulla Fore made, trouble staying asleep  The following were reviewed and entered/updated in epic: Past Medical History:  Diagnosis Date   Adenomatous polyp of colon    Allergy    seasonal   Arthritis    BPH (benign prostatic hyperplasia)    Diverticulosis 09/2010   episode of diverticulitis   GERD (gastroesophageal reflux disease)    Hyperlipidemia    Hypertension    Low back pain    Patient Active Problem List   Diagnosis Date Noted   Chronic narcotic use 10/10/2016    Priority: High   Spinal stenosis 05/11/2020    Priority: Medium    Aortic atherosclerosis ([place]) 05/11/2020    Priority: Medium    Arthritis of right ankle 02/11/2014    Priority: Medium    BPH (benign prostatic hyperplasia) 12/17/2006    Priority: Medium    Hyperlipidemia 08/19/2006    Priority: Medium    Essential hypertension 08/19/2006    Priority: Medium    Allergic rhinitis 02/11/2014    Priority: Low   Family history of melanoma 02/11/2014    Priority: Low   GERD (gastroesophageal reflux disease) 02/11/2014    Priority: Low   Former smoker 02/11/2014    Priority: Low   INSOMNIA, CHRONIC 07/01/2009    Priority: Low   Actinic keratosis 12/17/2006    Priority: Low   History of colonic polyps 08/26/2006    Priority: Low   Hiatal hernia 01/19/2020   Pyogenic granuloma 08/21/2019   Mass of left finger 08/13/2019   Arthritis of left foot 08/27/2016   Past Surgical History:  Procedure Laterality Date   ANKLE FRACTURE SURGERY     chronic pain as result   COLONOSCOPY     FINGER SURGERY Left    bite by a crab   HERNIA REPAIR     jan 2012, [facility]    Family History  Problem Relation Age of Onset   Cancer  Father        melanoma, cousin also died of it   Colon polyps Sister    Cancer Brother        melanoma   Cancer Paternal Uncle        Colon cancer   Colon cancer Paternal Uncle    Esophageal cancer Neg Hx    Stomach cancer Neg Hx    Rectal cancer Neg Hx     Medications- reviewed and updated Current Outpatient Medications  Medication Sig Dispense Refill   amLODipine-benazepril (LOTREL) 5-20 MG capsule TAKE ONE CAPSULE BY MOUTH DAILY 90 capsule 1   atorvastatin (LIPITOR) 40 MG tablet TAKE ONE TABLET BY MOUTH DAILY 90 tablet 1   Azelastine HCl 0.15 % SOLN Place 1 spray into the nose as needed.     Cholecalciferol (VITAMIN D3 MAXIMUM STRENGTH) 125 MCG (5000 UT) capsule Take 1 capsule (5,000 Units total) by mouth daily. 90 capsule 3   Cyanocobalamin (VITAMIN B-12 PO) Take by mouth. Once weekly.     famotidine (PEPCID) 20 MG tablet Take 1 tablet (20 mg total) by mouth at bedtime.     finasteride (PROSCAR) 5 MG tablet TAKE ONE TABLET BY MOUTH DAILY 90 tablet 3   fluticasone (FLONASE) 50 MCG/ACT nasal spray SPRAY TWO SPRAYS IN  EACH NOSTRIL ONCE DAILY 48 g 1   meloxicam (MOBIC) 7.5 MG tablet TAKE ONE TABLET BY MOUTH DAILY 90 tablet 1   omeprazole (PRILOSEC) 20 MG capsule TAKE 1 CAPSULE 20-30 MINUITES PRIOR TO BREAKFAST 90 capsule 3   No current facility-administered medications for this visit.    Allergies-reviewed and updated Allergies  Allergen Reactions   Dust Mite Mixed Allergen Ext [Mite (D. Farinae)]     Social History   Social History Narrative   Married >30 years in 2016. No children. 2 dogs.       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 Oropharynx normal Neck: no thyromegaly CV: RRR no murmurs rubs or  gallops Lungs: CTAB no crackles, wheeze, rhonchi Abdomen: soft/nontender/nondistended/normal bowel sounds. No rebound or guarding.  Ext: no edema Skin: warm, dry Neuro: grossly normal, moves all extremities, PERRLA With back pain hard to get up from table    Assessment and Plan  70 y.o. male presenting for annual physical.  Health Maintenance counseling: 1. Anticipatory guidance: Patient counseled regarding regular dental exams -q6 months, eye exams twice yearly ,  avoiding smoking and second hand smoke , limiting alcohol to 2 beverages per day-rare social beer. No illicit drugs - sparing marijuana socially 2. Risk factor reduction:  Advised patient of need for regular exercise and diet rich and fruits and vegetables to reduce risk of heart attack and stroke.  Exercise- limited by right ankle- [place] (doesn't feel as comfortable walking around [place] the [place]). had stationary bike but didn't use. Trying to at least remain active- ideally would do 150 mins exercise per weak- encourage   Diet/weight management-still down from peak weight. Some gain over holidays but year over year only up 1 lbs. Discussed water only option. He is working on cutting down sweets.  Wt Readings from Last 3 Encounters:  01/20/21 200 lb 12.8 oz (91.1 kg)  01/02/21 197 lb 9.6 oz (89.6 kg)  08/24/20 197 lb (89.4 kg)  3. Immunizations/screenings/ancillary studies-fully up-to-date Immunization History  Administered Date(s) Administered   Fluad Quad(high Dose 65+) 10/14/2018   Influenza Split 11/21/2011   Influenza Whole 11/18/2008, 01/11/2010   Influenza, High Dose Seasonal PF 12/20/2015, 11/19/2016, 02/01/2017, 11/19/2017   Influenza,inj,Quad PF,6+ Mos 11/11/2012, 11/18/2013, 12/07/2014   Influenza-Unspecified 10/26/2019, 11/03/2020   PFIZER(Purple Top)SARS-COV-2 Vaccination 02/20/2019, 03/17/2019, 09/01/2019, 10/16/2019, 04/29/2020   Pfizer Covid-19 Vaccine  Bivalent Booster 57yrs & up 10/11/2020   Pneumococcal Conjugate-13 02/15/2015   Pneumococcal Polysaccharide-23 02/16/2016, 02/01/2017, 09/01/2019   Td 01/15/2005   Tdap 10/10/2016   Zoster Recombinat (Shingrix) 05/21/2017, 08/13/2017   Zoster, Live 07/14/2012   4. Prostate cancer screening-  patient with BPH-reasonable control on finasteride.  We will continue to trend PSA. Nocturia at least once a night for most part  Lab Results  Component Value Date   PSA 0.17 05/11/2020   PSA 0.21 01/19/2020   PSA 0.18 01/05/2019   5. Colon cancer screening - 08/24/20 with 10 year repeat planned - hx of colon polyps but not precancerous. 6.  Skin cancer screening- yearly derm visit.  advised regular sunscreen use. Denies worrisome, changing, or new skin lesions.  7. Smoking associated screening (lung cancer screening, AAA screen 65-75, UA)- Former smoker- AAA screening -2019.  Quit 1990s-no regular screening required 8. 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 Also made some jellies- also gives away. Grows on over an acre- was slightly down year  #insomnia- sleeps 4-5 hours- wakes up and has a very hard time going back to sleep. Going on for some time- still feels somewhat rested next day but not as fully rested as in the past -he may reach out to try trazodone at later date for before bed- if took later in night half tablet likely wait 8 hours to drive   #History of diverticulitis-thankfully no recurrence since early April.  Colonoscopy scheduled for august 2022 reassuring other than known diverticula-10-year repeat needed   #hypertension S: medication: amlodipine- benazepril 5-20 mg daily BP Readings from Last 3 Encounters:  01/20/21 120/72  01/02/21 130/78  08/24/20 131/77  A/P: Controlled. Continue current medications.      #hyperlipidemia #Aortic atherosclerosis/coronary artery calcium S: Medication:Atorvastatin 40 mg daily.  LDL had been slightly high but patient was wanted to work on healthy eating/regular exercise Lab Results  Component Value Date   CHOL 153 01/19/2020   HDL 59.50 01/19/2020   LDLCALC 79 01/19/2020   LDLDIRECT 90.0 02/11/2014   TRIG 76.0 01/19/2020   CHOLHDL 3 01/19/2020   A/P: With aortic atherosclerosis and coronary artery calcium LDL goal is under 70.  Update lipid panel today-for now continue current medication-patient prefers to work on lifestyle if within close range of goal of 70 or less -still wants to get a dog   #BPH-stable on finasteride. Nocturia once or twice a night.  We will check PSA with ongoing finasteride use which is high risk medication   #Chronic pain-right ankle arthritis and spinal stenosis (no major issues- but was noted on ct for diverticulitis 05/11/20) -prior  chronic narcotics but now off since 2019-patient remained on chronic meloxicam 7.5 mg-we discussed importance of monitoring renal function at least every 6 months-we are checking that today   #GERD-on omeprazole 20 mg through GI-will defer to their expertise.  He is on B12 once a week thankfully to help maintain levels.  Does have hiatal hernia which contributes  Recommended follow up: No follow-ups on file. Future Appointments  Date Time Provider [place]  01/22/2022  8:45 AM [facility] COACH [facility]   Lab/Order associations: Not fasting   ICD-10-CM   1. Preventative health care  Z00.00     2. Hyperlipidemia, unspecified hyperlipidemia type  E78.5     3. Essential hypertension  I10     4. Aortic atherosclerosis ([place])  I70.0      I,Jada Bradford,acting as a scribe for Garret Reddish, MD.,have documented all relevant documentation on the behalf of Garret Reddish, MD,as directed by  Garret Reddish, MD while in the presence of Garret Reddish, MD.   I, Garret Reddish, MD, have  reviewed all documentation for this visit. The documentation on 01/20/21 for the exam, diagnosis, procedures, and orders are all accurate and complete.   Return precautions advised.  Garret Reddish, MD

## 2021-01-20 ENCOUNTER — Encounter: Payer: Self-pay | Admitting: Family Medicine

## 2021-01-20 ENCOUNTER — Ambulatory Visit (INDEPENDENT_AMBULATORY_CARE_PROVIDER_SITE_OTHER): Payer: Medicare PPO | Admitting: Family Medicine

## 2021-01-20 ENCOUNTER — Other Ambulatory Visit: Payer: Self-pay

## 2021-01-20 VITALS — BP 120/72 | HR 66 | Temp 98.1°F | Ht 65.0 in | Wt 200.8 lb

## 2021-01-20 DIAGNOSIS — E785 Hyperlipidemia, unspecified: Secondary | ICD-10-CM | POA: Diagnosis not present

## 2021-01-20 DIAGNOSIS — I10 Essential (primary) hypertension: Secondary | ICD-10-CM

## 2021-01-20 DIAGNOSIS — R351 Nocturia: Secondary | ICD-10-CM | POA: Diagnosis not present

## 2021-01-20 DIAGNOSIS — I700 Atherosclerosis of aorta: Secondary | ICD-10-CM | POA: Diagnosis not present

## 2021-01-20 DIAGNOSIS — Z0000 Encounter for general adult medical examination without abnormal findings: Secondary | ICD-10-CM | POA: Diagnosis not present

## 2021-01-20 DIAGNOSIS — N401 Enlarged prostate with lower urinary tract symptoms: Secondary | ICD-10-CM

## 2021-01-20 LAB — COMPREHENSIVE METABOLIC PANEL
ALT: 16 U/L (ref 0–53)
AST: 15 U/L (ref 0–37)
Albumin: 4.1 g/dL (ref 3.5–5.2)
Alkaline Phosphatase: 97 U/L (ref 39–117)
BUN: 20 mg/dL (ref 6–23)
CO2: 28 mEq/L (ref 19–32)
Calcium: 9.2 mg/dL (ref 8.4–10.5)
Chloride: 104 mEq/L (ref 96–112)
Creatinine, Ser: 0.95 mg/dL (ref 0.40–1.50)
GFR: 80.84 mL/min (ref >60.00)
Glucose, Bld: 79 mg/dL (ref 70–99)
Potassium: 4.1 mEq/L (ref 3.5–5.1)
Sodium: 138 mEq/L (ref 135–145)
Total Bilirubin: 0.7 mg/dL (ref 0.2–1.2)
Total Protein: 6.5 g/dL (ref 6.0–8.3)

## 2021-01-20 LAB — LIPID PANEL
Cholesterol: 142 mg/dL (ref 0–200)
HDL: 50.3 mg/dL (ref >39.00)
LDL Cholesterol: 69 mg/dL (ref 0–99)
NonHDL: 92.13
Total CHOL/HDL Ratio: 3
Triglycerides: 117 mg/dL (ref 0.0–149.0)
VLDL: 23.4 mg/dL (ref 0.0–40.0)

## 2021-01-20 LAB — CBC WITH DIFFERENTIAL/PLATELET
Basophils Absolute: 0 10*3/uL (ref 0.0–0.1)
Basophils Relative: 0.4 % (ref 0.0–3.0)
Eosinophils Absolute: 0.1 10*3/uL (ref 0.0–0.7)
Eosinophils Relative: 1.3 % (ref 0.0–5.0)
HCT: 42.3 % (ref 39.0–52.0)
Hemoglobin: 14.2 g/dL (ref 13.0–17.0)
Lymphocytes Relative: 12.8 % (ref 12.0–46.0)
Lymphs Abs: 1.4 10*3/uL (ref 0.7–4.0)
MCHC: 33.5 g/dL (ref 30.0–36.0)
MCV: 85.4 fl (ref 78.0–100.0)
Monocytes Absolute: 0.8 10*3/uL (ref 0.1–1.0)
Monocytes Relative: 7.2 % (ref 3.0–12.0)
Neutro Abs: 8.9 10*3/uL — ABNORMAL HIGH (ref 1.4–7.7)
Neutrophils Relative %: 78.3 % — ABNORMAL HIGH (ref 43.0–77.0)
Platelets: 206 10*3/uL (ref 150.0–400.0)
RBC: 4.96 Mil/uL (ref 4.22–5.81)
RDW: 13.2 % (ref 11.5–15.5)
WBC: 11.3 10*3/uL — ABNORMAL HIGH (ref 4.0–10.5)

## 2021-01-20 LAB — PSA: PSA: 0.22 ng/mL (ref 0.10–4.00)

## 2021-01-20 NOTE — Patient Instructions (Addendum)
Please stop by lab before you go If you have mychart- we will send your results within 3 business days of [place] receiving them.  If you do not have mychart- we will call you about results within 5 business days of [place] receiving them.  *please also note that you will see labs on mychart as soon as they post. I will later go in and write notes on them- will say "notes from Dr. Yong Channel"  Recommended follow up: Return in about 6 months (around 07/20/2021) for follow up- or sooner if needed.

## 2021-01-31 DIAGNOSIS — D3132 Benign neoplasm of left choroid: Secondary | ICD-10-CM | POA: Diagnosis not present

## 2021-01-31 DIAGNOSIS — H40013 Open angle with borderline findings, low risk, bilateral: Secondary | ICD-10-CM | POA: Diagnosis not present

## 2021-01-31 DIAGNOSIS — H02834 Dermatochalasis of left upper eyelid: Secondary | ICD-10-CM | POA: Diagnosis not present

## 2021-01-31 DIAGNOSIS — H2513 Age-related nuclear cataract, bilateral: Secondary | ICD-10-CM | POA: Diagnosis not present

## 2021-03-12 ENCOUNTER — Other Ambulatory Visit: Payer: Self-pay | Admitting: Family Medicine

## 2021-04-04 DIAGNOSIS — L814 Other melanin hyperpigmentation: Secondary | ICD-10-CM | POA: Diagnosis not present

## 2021-04-04 DIAGNOSIS — D225 Melanocytic nevi of trunk: Secondary | ICD-10-CM | POA: Diagnosis not present

## 2021-04-04 DIAGNOSIS — L821 Other seborrheic keratosis: Secondary | ICD-10-CM | POA: Diagnosis not present

## 2021-04-04 DIAGNOSIS — L720 Epidermal cyst: Secondary | ICD-10-CM | POA: Diagnosis not present

## 2021-05-07 DIAGNOSIS — K115 Sialolithiasis: Secondary | ICD-10-CM | POA: Diagnosis not present

## 2021-05-08 ENCOUNTER — Other Ambulatory Visit: Payer: Self-pay

## 2021-05-08 ENCOUNTER — Encounter: Payer: Self-pay | Admitting: Family Medicine

## 2021-05-08 DIAGNOSIS — K115 Sialolithiasis: Secondary | ICD-10-CM

## 2021-05-09 ENCOUNTER — Encounter: Payer: Self-pay | Admitting: Family Medicine

## 2021-05-10 ENCOUNTER — Encounter: Payer: Self-pay | Admitting: Family Medicine

## 2021-05-10 NOTE — Telephone Encounter (Signed)
Pt stated he did not want to come in. He is just going to wait until he goes to the ENT. ?

## 2021-05-11 DIAGNOSIS — H1132 Conjunctival hemorrhage, left eye: Secondary | ICD-10-CM | POA: Diagnosis not present

## 2021-05-11 DIAGNOSIS — H10812 Pingueculitis, left eye: Secondary | ICD-10-CM | POA: Diagnosis not present

## 2021-05-17 DIAGNOSIS — H10812 Pingueculitis, left eye: Secondary | ICD-10-CM | POA: Diagnosis not present

## 2021-05-17 DIAGNOSIS — H1132 Conjunctival hemorrhage, left eye: Secondary | ICD-10-CM | POA: Diagnosis not present

## 2021-05-31 DIAGNOSIS — H10812 Pingueculitis, left eye: Secondary | ICD-10-CM | POA: Diagnosis not present

## 2021-06-13 ENCOUNTER — Other Ambulatory Visit: Payer: Self-pay | Admitting: Family Medicine

## 2021-06-13 ENCOUNTER — Encounter: Payer: Self-pay | Admitting: Family Medicine

## 2021-07-14 IMAGING — CT CT ABD-PELV W/ CM
2 of 5 series · 15 of 46 positions shown, 17 images · IV contrast (Omnipaque)
Comparison: None.

CLINICAL DATA: Left lower quadrant pain

EXAM:
CT ABDOMEN AND PELVIS WITH CONTRAST
TECHNIQUE: Multidetector CT imaging of the abdomen and pelvis was performed
using the standard protocol following bolus administration of
intravenous contrast.
CONTRAST:  100mL OMNIPAQUE IOHEXOL 300 MG/ML  SOLN

[Series 2: axial st · axial · 0.98mm/px · z∈[-447,-57]mm · 12 of 88 slices shown, 14 images]
[im 5/88  soft-tissue]
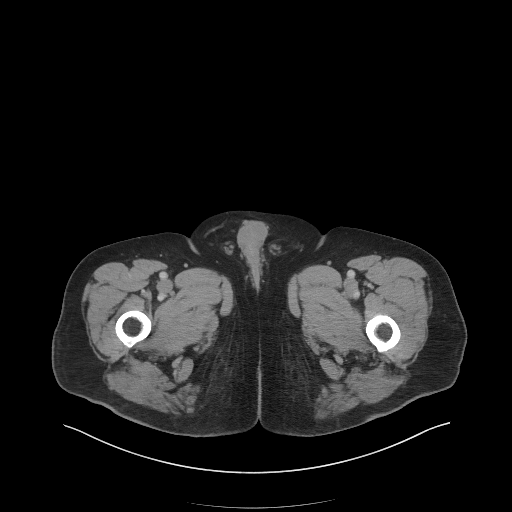
[im 5/88  bone]
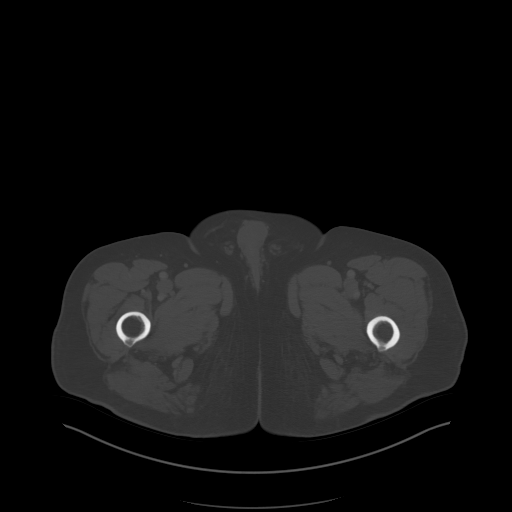
[im 14/88  soft-tissue]
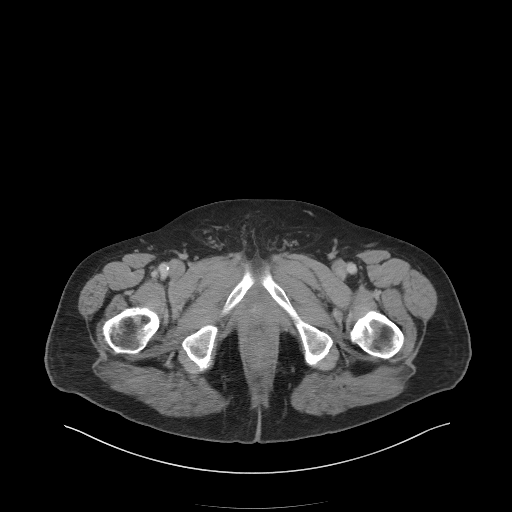
[im 19/88  soft-tissue]
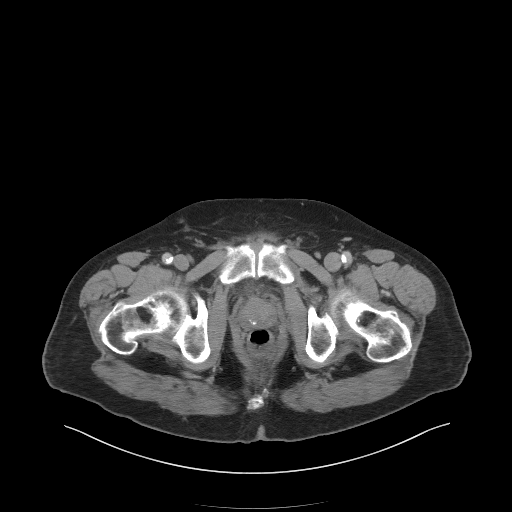
[im 28/88  soft-tissue]
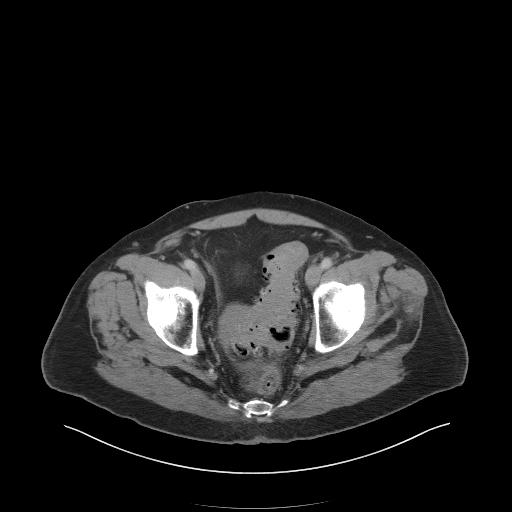
[im 33/88  soft-tissue]
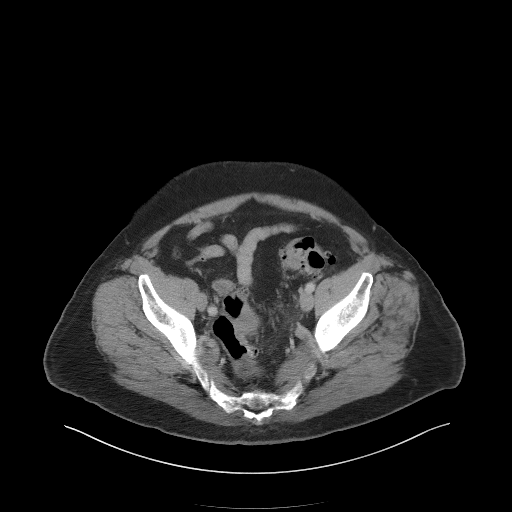
[im 42/88  soft-tissue]
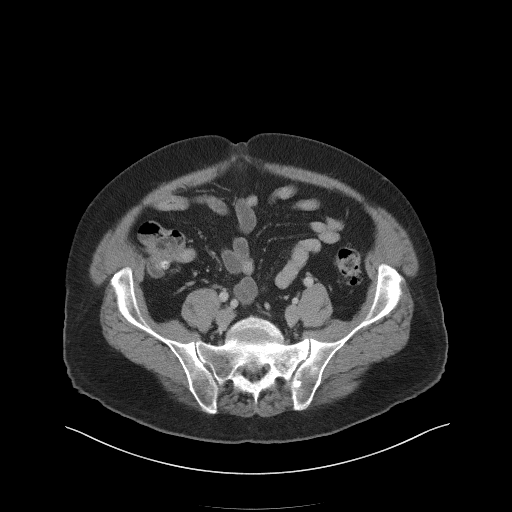
[im 46/88  soft-tissue]
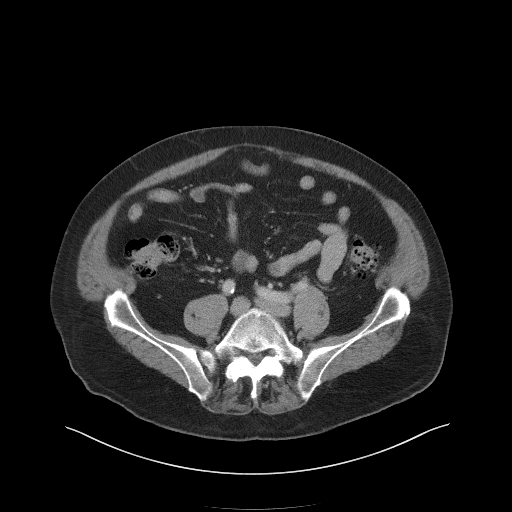
[im 55/88  soft-tissue]
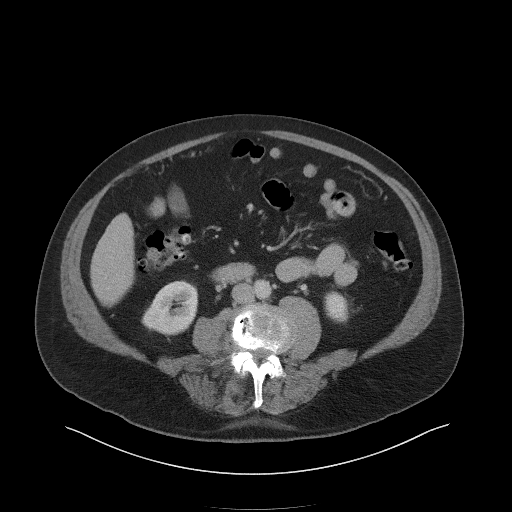
[im 60/88  soft-tissue]
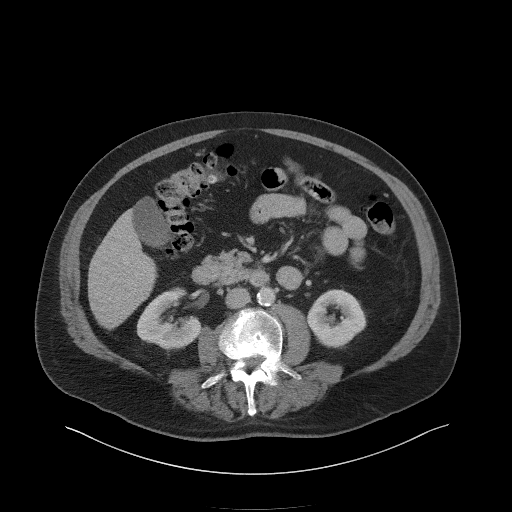
[im 60/88  bone]
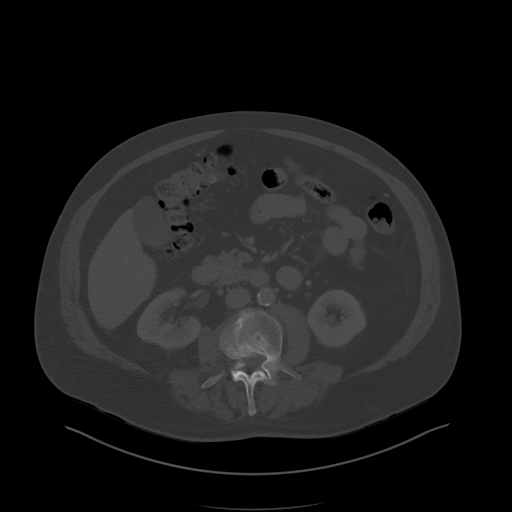
[im 69/88  soft-tissue]
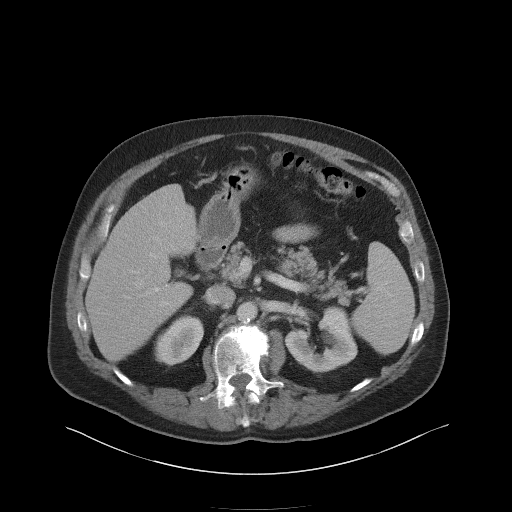
[im 74/88  soft-tissue]
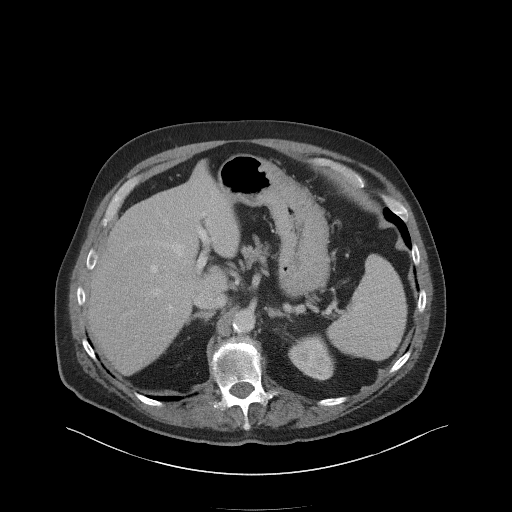
[im 83/88  soft-tissue]
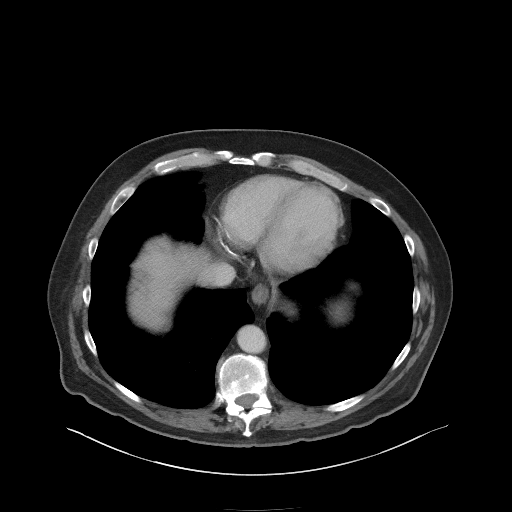

[Series 5: coronal st · coronal · 0.84mm/px · 3 of 111 slices shown]
[im 37/111  soft-tissue]
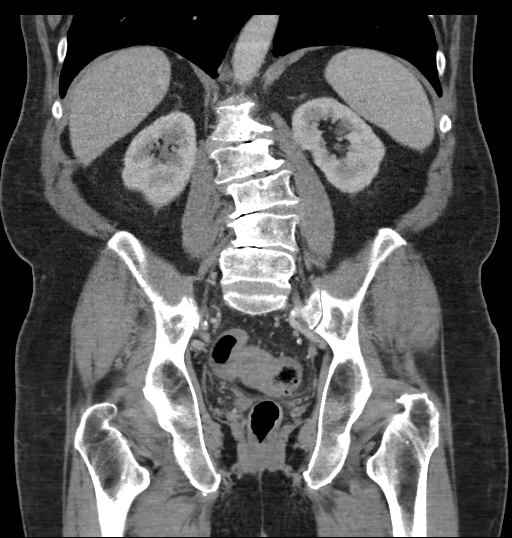
[im 49/111  soft-tissue]
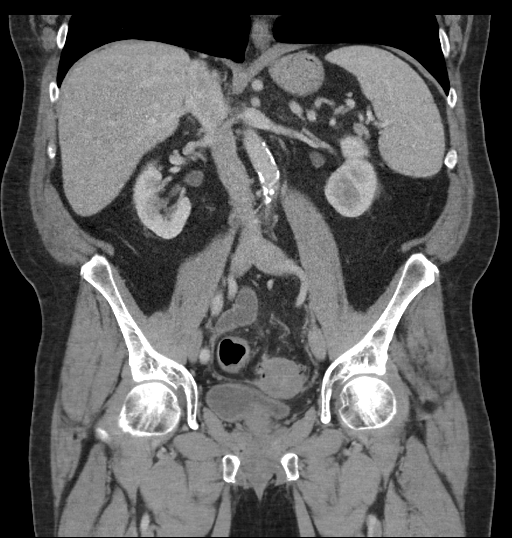
[im 62/111  soft-tissue]
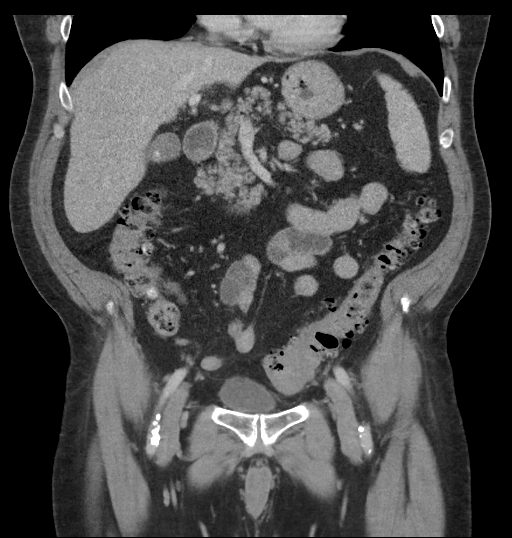

[15 of 46 positions shown; findings below may reference images not displayed]

FINDINGS: Lower chest: There is slight posterior right base atelectasis. Lung
bases otherwise are clear. Coronary artery calcification noted.

Hepatobiliary: No focal liver lesions are appreciable. There is
cholelithiasis. The gallbladder wall does not appear appreciably
thickened by CT. There is no biliary duct dilatation.

Pancreas: There is no appreciable pancreatic mass or inflammatory
focus.

Spleen: No splenic lesions are evident.

Adrenals/Urinary Tract: Adrenals bilaterally appear normal. There is
no evident renal mass or hydronephrosis on either side. There is no
evident renal or ureteral calculus on either side. Urinary bladder
is midline with wall thickness within normal limits.

Stomach/Bowel: There are multiple sigmoid diverticula. There is wall
thickening in the distal sigmoid colon region with soft tissue
stranding in this area consistent with a degree of diverticulitis.
Note that there are multiple diverticula elsewhere throughout the
colon without apparent inflammatory change elsewhere. There is no
evident abscess or perforation in the area of sigmoid
diverticulitis.

No bowel obstruction. Terminal ileum appears normal. Appendix
appears normal.

Vascular/Lymphatic: No evident abdominal aortic aneurysm. Aorta is
somewhat tortuous. There are foci of aortic and iliac artery
atherosclerosis. Major venous structures appear patent.

Reproductive: There is loss of fat plane between the inferior
bladder and prostate. There are prostatic calculi. Overall size of
prostate appears within normal limits. Seminal vesicles appear
normal in size and contour.

Other: No evident abscess or ascites in the abdomen or pelvis. There
is mild fat in the umbilicus. Fat noted in each inguinal ring.

Musculoskeletal: There is degenerative change in the lower thoracic
and lumbar regions. There is a degree of spinal stenosis at L2-3,
L3-4, and L4-5 due to bony hypertrophy and disc protrusion. No
blastic or lytic bone lesions. No evident intramuscular lesions.
IMPRESSION: 1. Findings indicative of mid to distal sigmoid diverticulitis. No
abscess or perforation in this area. Note that there is marked
localized wall thickening in the distal sigmoid region. An
underlying neoplasm could present in this manner. This finding will
warrant direct visualization after treatment for acute inflammatory
change in this region.

2. Sigmoid diverticulosis throughout the remainder of the colon. No
other areas of colonic inflammation. No bowel obstruction.

3.  Appendix appears normal.  No abscess in the abdomen or pelvis.

4.  Cholelithiasis.

5. Spinal stenosis at L2-3, L3-4, and L4-5 due to bony hypertrophy
and disc protrusion.

6. Aortic Atherosclerosis ([id number]). Foci of iliac artery and
coronary artery calcification noted.

7. Loss of fat plane between the inferior bladder and prostate. This
finding warrants clinical assessment and PSA evaluation.

## 2021-07-21 DIAGNOSIS — H524 Presbyopia: Secondary | ICD-10-CM | POA: Diagnosis not present

## 2021-07-21 DIAGNOSIS — H52223 Regular astigmatism, bilateral: Secondary | ICD-10-CM | POA: Diagnosis not present

## 2021-07-21 DIAGNOSIS — H5203 Hypermetropia, bilateral: Secondary | ICD-10-CM | POA: Diagnosis not present

## 2021-07-21 DIAGNOSIS — H40013 Open angle with borderline findings, low risk, bilateral: Secondary | ICD-10-CM | POA: Diagnosis not present

## 2021-07-27 DIAGNOSIS — H1045 Other chronic allergic conjunctivitis: Secondary | ICD-10-CM | POA: Diagnosis not present

## 2021-07-27 DIAGNOSIS — J3089 Other allergic rhinitis: Secondary | ICD-10-CM | POA: Diagnosis not present

## 2021-07-27 DIAGNOSIS — K219 Gastro-esophageal reflux disease without esophagitis: Secondary | ICD-10-CM | POA: Diagnosis not present

## 2021-09-15 ENCOUNTER — Other Ambulatory Visit: Payer: Self-pay | Admitting: Family Medicine

## 2021-09-15 ENCOUNTER — Other Ambulatory Visit: Payer: Self-pay | Admitting: Gastroenterology

## 2021-09-23 ENCOUNTER — Emergency Department (HOSPITAL_BASED_OUTPATIENT_CLINIC_OR_DEPARTMENT_OTHER): Payer: Medicare PPO

## 2021-09-23 ENCOUNTER — Encounter (HOSPITAL_BASED_OUTPATIENT_CLINIC_OR_DEPARTMENT_OTHER): Payer: Self-pay | Admitting: Emergency Medicine

## 2021-09-23 ENCOUNTER — Emergency Department (HOSPITAL_BASED_OUTPATIENT_CLINIC_OR_DEPARTMENT_OTHER)
Admission: EM | Admit: 2021-09-23 | Discharge: 2021-09-23 | Disposition: A | Discharge status: Home / Self Care | Payer: Medicare PPO | Attending: Emergency Medicine | Admitting: Emergency Medicine

## 2021-09-23 ENCOUNTER — Other Ambulatory Visit: Payer: Self-pay

## 2021-09-23 DIAGNOSIS — R062 Wheezing: Secondary | ICD-10-CM

## 2021-09-23 DIAGNOSIS — J069 Acute upper respiratory infection, unspecified: Secondary | ICD-10-CM | POA: Insufficient documentation

## 2021-09-23 DIAGNOSIS — R059 Cough, unspecified: Secondary | ICD-10-CM | POA: Diagnosis not present

## 2021-09-23 DIAGNOSIS — Z79899 Other long term (current) drug therapy: Secondary | ICD-10-CM | POA: Insufficient documentation

## 2021-09-23 DIAGNOSIS — B9789 Other viral agents as the cause of diseases classified elsewhere: Secondary | ICD-10-CM | POA: Diagnosis not present

## 2021-09-23 DIAGNOSIS — I10 Essential (primary) hypertension: Secondary | ICD-10-CM | POA: Insufficient documentation

## 2021-09-23 DIAGNOSIS — Z20822 Contact with and (suspected) exposure to covid-19: Secondary | ICD-10-CM | POA: Diagnosis not present

## 2021-09-23 LAB — SARS CORONAVIRUS 2 BY RT PCR: SARS Coronavirus 2 by RT PCR: NEGATIVE

## 2021-09-23 MED ORDER — ALBUTEROL SULFATE HFA 108 (90 BASE) MCG/ACT IN AERS: 1.0000 | INHALATION_SPRAY | Freq: Four times a day (QID) | RESPIRATORY_TRACT | 0 refills | Status: DC | PRN

## 2021-09-23 MED ORDER — DEXAMETHASONE SODIUM PHOSPHATE 10 MG/ML IJ SOLN
10.0000 mg | Freq: Once | INTRAMUSCULAR | Status: AC
  Administered 2021-09-23: 10 mg via INTRAMUSCULAR
  Filled 2021-09-23: qty 1

## 2021-09-23 NOTE — ED Triage Notes (Signed)
Pt arrives pov, steady gait, c/o cough, congestion and nasal drainage with sneezing x 2 days. Denies fever

## 2021-09-23 NOTE — ED Provider Notes (Signed)
[facility] EMERGENCY DEPARTMENT Provider Note   CSN: [id number] Arrival date & time: 09/23/21  1158     History  Chief Complaint  Patient presents with   Cough    MATEJ SAPPENFIELD is a 71 y.o. male with history of hyperlipidemia, hypertension who presents to the emergency department for evaluation of cough, congestion and rhinorrhea that started approximately 2 days ago.  He states that cough is productive with green sputum and he also endorses some chest tightness and very mild shortness of breath.  He denies abdominal pain, nausea, vomiting and diarrhea.  He took a Claritin yesterday without any relief in symptoms.  No recent travel, no known sick contacts.  He denies fever, chills.   Cough Associated symptoms: no chest pain, no fever and no shortness of breath        Home Medications Prior to Admission medications   Medication Sig Start Date End Date Taking? Authorizing Provider  albuterol (VENTOLIN HFA) 108 (90 Base) MCG/ACT inhaler Inhale 1-2 puffs into the lungs every 6 (six) hours as needed for wheezing or shortness of breath. 09/23/21  Yes Kathe Becton R, PA-C  amLODipine-benazepril (LOTREL) 5-20 MG capsule TAKE ONE CAPSULE BY MOUTH DAILY 06/13/21   Marin Olp, MD  atorvastatin (LIPITOR) 40 MG tablet TAKE ONE TABLET BY MOUTH DAILY 06/13/21   Marin Olp, MD  Azelastine HCl 0.15 % SOLN Place 1 spray into the nose as needed.    [provider]  Cholecalciferol (VITAMIN D3 MAXIMUM STRENGTH) 125 MCG (5000 UT) capsule Take 1 capsule (5,000 Units total) by mouth daily. 02/24/20   Hilts, Legrand Como, MD  Cyanocobalamin (VITAMIN B-12 PO) Take by mouth. Once weekly.    [provider]  famotidine (PEPCID) 20 MG tablet Take 1 tablet (20 mg total) by mouth at bedtime. 09/04/19   Milus Banister, MD  finasteride (PROSCAR) 5 MG tablet TAKE ONE TABLET BY MOUTH DAILY 09/15/21   Marin Olp, MD  fluticasone [facility]) 50 MCG/ACT nasal spray SPRAY TWO  SPRAYS IN [facility] NOSTRIL ONCE DAILY 07/04/18   Marin Olp, MD  meloxicam (MOBIC) 7.5 MG tablet TAKE ONE TABLET BY MOUTH DAILY 09/15/21   Marin Olp, MD  omeprazole (PRILOSEC) 20 MG capsule TAKE ONE CAPSULE BY MOUTH EVERY MORNING 20 TO 30 MINUTES BEFORE BREAKFAST 09/15/21   Milus Banister, MD      Allergies    Dust mite mixed allergen ext [mite (d. farinae)]    Review of Systems   Review of Systems  Constitutional:  Negative for fever.  HENT:  Positive for congestion.   Respiratory:  Positive for cough. Negative for shortness of breath.   Cardiovascular:  Negative for chest pain.  Gastrointestinal:  Negative for abdominal pain, diarrhea, nausea and vomiting.    Physical Exam Updated Vital Signs BP (!) 147/87   Pulse 79   Temp 98.7 F (37.1 C) (Oral)   Resp 18   Ht '5\' 6"'$  (1.676 m)   Wt 84.8 kg   SpO2 96%   BMI 30.18 kg/m  Physical Exam Vitals and nursing note reviewed.  Constitutional:      General: He is not in acute distress.    Appearance: He is not ill-appearing.  HENT:     Head: Atraumatic.     Nose: Congestion and rhinorrhea present.     Mouth/Throat:     Pharynx: Oropharynx is clear. No posterior oropharyngeal erythema.  Eyes:     Conjunctiva/sclera: Conjunctivae normal.  Cardiovascular:     Rate and Rhythm: Normal rate and regular rhythm.     Pulses: Normal pulses.     Heart sounds: No murmur heard. Pulmonary:     Effort: Pulmonary effort is normal. No respiratory distress.     Breath sounds: Wheezing present.     Comments: Mild wheezing noted in right upper lobe.  No accessory muscle usage.  Speaking in full and complete sentences. Abdominal:     General: Abdomen is flat. There is no distension.     Palpations: Abdomen is soft.     Tenderness: There is no abdominal tenderness.  Musculoskeletal:        General: Normal range of motion.     Cervical back: Normal range of motion.  Skin:    General: Skin is warm and dry.     Capillary Refill:  Capillary refill takes less than 2 seconds.  Neurological:     General: No focal deficit present.     Mental Status: He is alert.  Psychiatric:        Mood and Affect: Mood normal.     ED Results / Procedures / Treatments   Labs (all labs ordered are listed, but only abnormal results are displayed) Labs Reviewed  SARS CORONAVIRUS 2 BY RT PCR    EKG None  Radiology DG Chest 2 View  Result Date: 09/23/2021 CLINICAL DATA:  Cough, chest congestion, sneezing EXAM: CHEST - 2 VIEW COMPARISON:  02/01/2010 FINDINGS: The heart size and mediastinal contours are within normal limits. Both lungs are clear. The visualized skeletal structures are unremarkable. IMPRESSION: No active cardiopulmonary disease. Electronically Signed   By: Elmer Picker M.D.   On: 09/23/2021 12:22    Procedures Procedures    Medications Ordered in ED Medications  dexamethasone (DECADRON) injection 10 mg (has no administration in time range)    ED Course/ Medical Decision Making/ A&P                           Medical Decision Making Amount and/or Complexity of Data Reviewed Radiology: ordered.  Risk Prescription drug management.   71 year old male presents to the emergency department for evaluation of cough, congestion x2 days.  Differentials include COVID, pneumonia, viral URI, sinus infection.  Vitals are without significant abnormality.  On exam, he is pleasant and in no acute distress.  He is speaking in full and complete sentences and satting at 97% on room air.  On exam, there is very mild wheezing in the right upper lobe.  He does sound congested.  Oropharynx is clear without erythema or exudate.  I ordered COVID test which was negative.  I also ordered chest x-ray which was negative for infection or acute cardiopulmonary process.  Given the mild wheezing, I did give an injection of Decadron here in the ED and will discharge home with an albuterol inhaler.  Advised Robitussin or Mucinex decongestant  to help with his nasal congestion.  Return precautions were discussed.  Advised on normal course of viral illness and that he should feel better in 5 to 7 days.  Patient expresses understanding and is amenable to plan.  Discharged home in stable condition. Final Clinical Impression(s) / ED Diagnoses Final diagnoses:  Viral URI with cough  Wheezing    Rx / DC Orders ED Discharge Orders          Ordered    albuterol (VENTOLIN HFA) 108 (90 Base) MCG/ACT inhaler  Every 6 hours  PRN        09/23/21 1413              Tonye Pearson, PA-C 09/23/21 1417    Sherwood Gambler, MD 09/24/21 [phone]

## 2021-09-23 NOTE — ED Notes (Signed)
Patient states that he is coughing up clear phlegm. Alert x4 Breathing non labored. States that he has a headaches . Denies any blurred vision

## 2021-09-23 NOTE — Discharge Instructions (Signed)
Your COVID test was negative and your chest x-ray was clear of infection.  It is likely that you have another viral illness causing your symptoms.  Since it is a virus, does not typically respond to antibiotics.  Your body just needs time to clear the infection on its own.  In the meantime, we will treat you symptomatically.  I given you an injection of a steroid here in the emergency department to help with some of the wheezing and shortness of breath.  I have also sent you an inhaler that you can use as needed.  I would pick up either Robitussin or Mucinex decongestant to help with your nasal congestion symptoms.  Please drink plenty of fluids to thin out the secretions and get plenty of rest.  You should feel better in 5 to 7 days.

## 2021-10-09 ENCOUNTER — Encounter: Payer: Self-pay | Admitting: *Deleted

## 2021-12-08 ENCOUNTER — Other Ambulatory Visit: Payer: Self-pay | Admitting: Family Medicine

## 2021-12-14 ENCOUNTER — Other Ambulatory Visit: Payer: Self-pay | Admitting: Family Medicine

## 2021-12-14 ENCOUNTER — Encounter: Payer: Self-pay | Admitting: Family Medicine

## 2021-12-22 ENCOUNTER — Ambulatory Visit: Payer: Medicare PPO | Admitting: Family Medicine

## 2021-12-22 ENCOUNTER — Encounter: Payer: Self-pay | Admitting: Family Medicine

## 2021-12-22 VITALS — BP 134/78 | HR 78 | Temp 98.5°F | Ht 66.0 in | Wt 195.0 lb

## 2021-12-22 DIAGNOSIS — I10 Essential (primary) hypertension: Secondary | ICD-10-CM

## 2021-12-22 DIAGNOSIS — R059 Cough, unspecified: Secondary | ICD-10-CM

## 2021-12-22 DIAGNOSIS — R0981 Nasal congestion: Secondary | ICD-10-CM

## 2021-12-22 LAB — POCT INFLUENZA A/B
Influenza A, POC: NEGATIVE
Influenza B, POC: NEGATIVE

## 2021-12-22 LAB — POC COVID19 BINAXNOW: SARS Coronavirus 2 Ag: NEGATIVE

## 2021-12-22 MED ORDER — AMOXICILLIN-POT CLAVULANATE 875-125 MG PO TABS: 1.0000 | ORAL_TABLET | Freq: Two times a day (BID) | ORAL | 0 refills | Status: AC

## 2021-12-22 MED ORDER — GUAIFENESIN-CODEINE 100-10 MG/5ML PO SOLN: 5.0000 mL | Freq: Four times a day (QID) | ORAL | 0 refills | Status: DC | PRN

## 2021-12-22 NOTE — Patient Instructions (Addendum)
I am concerned for bacterial sinusitis with >10 days of sinus congestion/discharge- this is the most irritated area on exam as well. Lungs were clear making pneumonia less likely but discussed doesn't firmly rule out walking pneumonia/atypical pneumonia so I still need him to let me know if he fails to improve.  -send in Augmentin antibiotic for sinusitis -trial codeine cough syrup with mucinex- likely focus on before bed use  Recommended follow up: Return for as needed for new, worsening, persistent symptoms.

## 2021-12-22 NOTE — Progress Notes (Signed)
Phone [phone] In person visit   Subjective:   Matthew Watson is a 71 y.o. year old very pleasant male patient who presents for/with See problem oriented charting Chief Complaint  Patient presents with   Nasal Congestion    Testing pt for covid and flu. Both test negative. Pt states nasal and chest congestion about 9x days ago starting, pt says nasal congestion gone but still having cough. Pt not able to sleep because his chest starts to rattle and he wakes up to cough and then can't go back to sleep.     Past Medical History-  Patient Active Problem List   Diagnosis Date Noted   Chronic narcotic use 10/10/2016    Priority: High   Spinal stenosis 05/11/2020    Priority: Medium    Aortic atherosclerosis ([place]) 05/11/2020    Priority: Medium    Arthritis of right ankle 02/11/2014    Priority: Medium    BPH (benign prostatic hyperplasia) 12/17/2006    Priority: Medium    Hyperlipidemia 08/19/2006    Priority: Medium    Essential hypertension 08/19/2006    Priority: Medium    Allergic rhinitis 02/11/2014    Priority: Low   Family history of melanoma 02/11/2014    Priority: Low   GERD (gastroesophageal reflux disease) 02/11/2014    Priority: Low   Former smoker 02/11/2014    Priority: Low   INSOMNIA, CHRONIC 07/01/2009    Priority: Low   Actinic keratosis 12/17/2006    Priority: Low   History of colonic polyps 08/26/2006    Priority: Low   Hiatal hernia 01/19/2020   Pyogenic granuloma 08/21/2019   Mass of left finger 08/13/2019   Arthritis of left foot 08/27/2016    Medications- reviewed and updated Current Outpatient Medications  Medication Sig Dispense Refill   albuterol (VENTOLIN HFA) 108 (90 Base) MCG/ACT inhaler Inhale 1-2 puffs into the lungs every 6 (six) hours as needed for wheezing or shortness of breath. 1 each 0   amLODipine-benazepril (LOTREL) 5-20 MG capsule TAKE 1 CAPSULE BY MOUTH DAILY 90 capsule 1   atorvastatin (LIPITOR) 40 MG tablet TAKE 1  TABLET BY MOUTH DAILY 90 tablet 1   Azelastine HCl 0.15 % SOLN Place 1 spray into the nose as needed.     Cholecalciferol (VITAMIN D3 MAXIMUM STRENGTH) 125 MCG (5000 UT) capsule Take 1 capsule (5,000 Units total) by mouth daily. 90 capsule 3   Cyanocobalamin (VITAMIN B-12 PO) Take by mouth. Once weekly.     famotidine (PEPCID) 20 MG tablet Take 1 tablet (20 mg total) by mouth at bedtime.     finasteride (PROSCAR) 5 MG tablet TAKE ONE TABLET BY MOUTH DAILY 90 tablet 3   fluticasone (FLONASE) 50 MCG/ACT nasal spray SPRAY TWO SPRAYS IN EACH NOSTRIL ONCE DAILY 48 g 1   guaiFENesin-codeine 100-10 MG/5ML syrup Take 5 mLs by mouth every 6 (six) hours as needed for cough (do not drive for 8 hours after use). 120 mL 0   meloxicam (MOBIC) 7.5 MG tablet TAKE ONE TABLET BY MOUTH DAILY 90 tablet 1   omeprazole (PRILOSEC) 20 MG capsule TAKE ONE CAPSULE BY MOUTH EVERY MORNING 20 TO 30 MINUTES BEFORE BREAKFAST 90 capsule 3   amoxicillin-clavulanate (AUGMENTIN) 875-125 MG tablet Take 1 tablet by mouth 2 (two) times daily for 7 days. 14 tablet 0   No current facility-administered medications for this visit.     Objective:  BP 134/78   Pulse 78   Temp 98.5 F (36.9 C) (  Temporal)   Ht '5\' 6"'$  (1.676 m)   Wt 195 lb (88.5 kg)   SpO2 97%   BMI 31.47 kg/m  Gen: NAD, resting comfortably Nasal turbinates erythematous and swollen with mixed clear/yellow discharge. Pharynx mildly erythematous with signs of drainage. No significant sinus tenderness. Nasal sounding voice. Oral cavity otherwise normal CV: RRR no murmurs rubs or gallops Lungs: CTAB no crackles, wheeze, rhonchi Ext: minimal edema Skin: warm, dry    Results for orders placed or performed in visit on 12/22/21 (from the past 24 hour(s))  POC COVID-19     Status: None   Collection Time: 12/22/21  3:17 PM  Result Value Ref Range   SARS Coronavirus 2 Ag Negative Negative  POCT Influenza A/B     Status: None   Collection Time: 12/22/21  3:18 PM   Result Value Ref Range   Influenza A, POC Negative Negative   Influenza B, POC Negative Negative       Assessment and Plan   # Cough/nasal congestion S: Patient has been dealing with nasal and chest congestion starting last Monday (12 days ago).  Nasal congestion seems to have improved  (but not gone) but cough lingers.cough was dark green or yellow now clearer with some specks sprinkled in. Similar what he is blowing out from nose. Feels stopped up in sinuses.  Not able to sleep well because chest starts around when he wakes up coughing and cannot go back to sleep. Perhaps mildly shortness of breath and definitely low energy.  -very similar symptoms last August but only lasted a week - Medications tried - mucinex (not sure if he had D or DM but we recommended against the D) . Prior inhaler given in the past in september no major benefit.  -tessalon not helpful in past. Says no allergy to codeine A/P: I am concerned for bacterial sinusitis with >10 days of sinus congestion/discharge- this is the most irritated area on exam as well. Lungs were clear making pneumonia less likely but discussed doesn't firmly rule out walking pneumonia/atypical pneumonia so I still need him to let me know if he fails to improve. Flu and covid negative.  -send in Augmentin antibiotic for sinusitis -trial codeine cough syrup with mucinex- likely focus on before bed use  #hypertension S: medication: Amlodipine-benazepril 5-20 mg Home readings #s: no recent checks BP Readings from Last 3 Encounters:  12/22/21 134/78  09/23/21 (!) 152/87  01/20/21 120/72  A/P: Controlled. But high normal on repeat Continue current medications.   Recommended follow up: Return for as needed for new, worsening, persistent symptoms. Future Appointments  Date Time Provider [place]  01/22/2022  8:45 AM [facility] HEALTH COACH [facility]  01/24/2022  8:20 AM Yong Channel Brayton Mars, MD [facility]    Lab/Order associations:    ICD-10-CM   1. Essential hypertension  I10     2. Cough, unspecified type  R05.9 POC COVID-19    POCT Influenza A/B    3. Nasal congestion  R09.81 POC COVID-19    POCT Influenza A/B      Meds ordered this encounter  Medications   amoxicillin-clavulanate (AUGMENTIN) 875-125 MG tablet    Sig: Take 1 tablet by mouth 2 (two) times daily for 7 days.    Dispense:  14 tablet    Refill:  0   guaiFENesin-codeine 100-10 MG/5ML syrup    Sig: Take 5 mLs by mouth every 6 (six) hours as needed for cough (do not drive for 8 hours after use).  Dispense:  120 mL    Refill:  0    Return precautions advised.  Garret Reddish, MD

## 2022-01-22 ENCOUNTER — Ambulatory Visit (INDEPENDENT_AMBULATORY_CARE_PROVIDER_SITE_OTHER): Payer: Medicare PPO

## 2022-01-22 VITALS — BP 126/80 | HR 62 | Temp 98.0°F | Wt 202.8 lb

## 2022-01-22 DIAGNOSIS — Z0000 Encounter for general adult medical examination without abnormal findings: Secondary | ICD-10-CM

## 2022-01-22 NOTE — Progress Notes (Signed)
Subjective:   Matthew Watson is a 72 y.o. male who presents for Medicare Annual/Subsequent preventive examination.  Review of Systems     Cardiac Risk Factors include: advanced age (>45mn, >>36women);hypertension;dyslipidemia;male gender;obesity (BMI >30kg/m2)     Objective:    Today's Vitals   01/22/22 0842  BP: 126/80  Pulse: 62  Temp: 98 F (36.7 C)  SpO2: 98%  Weight: 202 lb 12.8 oz (92 kg)   Body mass index is 32.73 kg/m.     01/22/2022    8:52 AM 09/23/2021   12:10 PM 01/02/2021    8:58 AM 04/23/2020    8:16 AM 03/01/2020    9:38 AM 12/21/2019    8:56 AM 09/08/2019    1:39 PM  Advanced Directives  Does Patient Have a Medical Advance Directive? Yes No Yes Yes Yes Yes Yes  Type of [place]of [place] will  Healthcare Power of AA[place]Living will Living will;Healthcare Power of Attorney Living will;Healthcare Power of [place] will  Does patient want to make changes to medical advance directive?    No - Patient declined No - Patient declined  No - Patient declined  Copy of HPort [place] Chart? No - copy requested  No - copy requested No - copy requested No - copy requested No - copy requested No - copy requested    Current Medications (verified) Outpatient Encounter Medications as of 01/22/2022  Medication Sig   amLODipine-benazepril (LOTREL) 5-20 MG capsule TAKE 1 CAPSULE BY MOUTH DAILY   atorvastatin (LIPITOR) 40 MG tablet TAKE 1 TABLET BY MOUTH DAILY   Azelastine HCl 0.15 % SOLN Place 1 spray into the nose as needed.   Cholecalciferol (VITAMIN D3 MAXIMUM STRENGTH) 125 MCG (5000 UT) capsule Take 1 capsule (5,000 Units total) by mouth daily.   Cyanocobalamin (VITAMIN B-12 PO) Take by mouth. Once weekly.   famotidine (PEPCID) 20 MG tablet Take 1 tablet (20 mg total) by mouth at bedtime.   finasteride (PROSCAR) 5 MG tablet TAKE ONE TABLET BY MOUTH DAILY    fluticasone (FLONASE) 50 MCG/ACT nasal spray SPRAY TWO SPRAYS IN EACH NOSTRIL ONCE DAILY   meloxicam (MOBIC) 7.5 MG tablet TAKE ONE TABLET BY MOUTH DAILY   omeprazole (PRILOSEC) 20 MG capsule TAKE ONE CAPSULE BY MOUTH EVERY MORNING 20 TO 30 MINUTES BEFORE BREAKFAST   albuterol (VENTOLIN HFA) 108 (90 Base) MCG/ACT inhaler Inhale 1-2 puffs into the lungs every 6 (six) hours as needed for wheezing or shortness of breath. (Patient not taking: Reported on 01/22/2022)   Zoster Vaccine Adjuvanted ([facility] injection    [DISCONTINUED] guaiFENesin-codeine 100-10 MG/5ML syrup Take 5 mLs by mouth every 6 (six) hours as needed for cough (do not drive for 8 hours after use).   No facility-administered encounter medications on file as of 01/22/2022.    Allergies (verified) Dust mite mixed allergen ext [mite (d. farinae)]   History: Past Medical History:  Diagnosis Date   Adenomatous polyp of colon    Allergy    seasonal   Arthritis    BPH (benign prostatic hyperplasia)    Diverticulosis 09/2010   episode of diverticulitis   GERD (gastroesophageal reflux disease)    Hyperlipidemia    Hypertension    Low back pain    Past Surgical History:  Procedure Laterality Date   ANKLE FRACTURE SURGERY     chronic pain as result   COLONOSCOPY     FINGER SURGERY  Left    bite by a crab   HERNIA REPAIR     jan 2012, [place]   Family History  Problem Relation Age of Onset   Cancer Father        melanoma, cousin also died of it   Colon polyps Sister    Cancer Brother        melanoma   Cancer Paternal Uncle        Colon cancer   Colon cancer Paternal Uncle    Esophageal cancer Neg Hx    Stomach cancer Neg Hx    Rectal cancer Neg Hx    Social History   Socioeconomic History   Marital status: Married    Spouse name: Not on file   Number of children: Not on file   Years of education: Not on file   Highest education level: Not on file  Occupational History   Occupation: retired  Tobacco Use    Smoking status: Former    Packs/day: 0.25    Years: 10.00    Total pack years: 2.50    Types: Cigarettes    Quit date: 03/23/1990    Years since quitting: 31.8   Smokeless tobacco: Never   Tobacco comments:    quit in 1992  Vaping Use   Vaping Use: Never used  Substance and Sexual Activity   Alcohol use: Yes    Alcohol/week: 6.0 standard drinks of alcohol    Types: 6 Cans of beer per week   Drug use: No   Sexual activity: Yes  Other Topics Concern   Not on file  Social History Narrative   Married >30 years in 2016. No children. 2 dogs.       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      Hobbies: Rabbit hunting, fishing, duck hunt   Social Determinants of Health   Financial Resource Strain: [place]  (01/22/2022)   Overall Financial Resource Strain (CARDIA)    Difficulty of Paying Living Expenses: Not hard at all  Food Insecurity: No Food Insecurity (01/22/2022)   Hunger Vital Sign    Worried About Running Out of Food in the Last Year: Never true    [place] in the Last Year: Never true  Transportation Needs: No Transportation Needs (01/22/2022)   PRAPARE - Hydrologist (Medical): No    Lack of Transportation (Non-Medical): No  Physical Activity: Sufficiently Active (01/22/2022)   Exercise Vital Sign    Days of Exercise per Week: 5 days    Minutes of Exercise per Session: 50 min  Stress: No Stress Concern Present (01/22/2022)   [place]    Feeling of Stress : Not at all  Social Connections: Moderately Isolated (01/22/2022)   Social Connection and Isolation Panel [NHANES]    Frequency of Communication with Friends and Family: More than three times a week    Frequency of Social Gatherings with Friends and Family: More than three times a week    Attends Religious Services: Never    Marine scientist or Organizations: No    Attends Music therapist: Never    Marital  Status: Married    Tobacco Counseling Counseling given: Not Answered Tobacco comments: quit in 1992   Clinical Intake:  Pre-visit preparation completed: Yes  Pain : No/denies pain     BMI - recorded: 32.73 Nutritional Status: BMI > 30  Obese Nutritional Risks: None Diabetes:  No  How often do you need to have someone help you when you read instructions, pamphlets, or other written materials from your doctor or pharmacy?: 1 - Never  Diabetic?no  Interpreter Needed?: No  Information entered by :: Lillie Columbia, LPN   Activities of Daily Living    01/22/2022    8:53 AM 01/18/2022    3:14 PM  In your present state of health, do you have any difficulty performing the following activities:  Hearing? 0 0  Vision? 0 0  Difficulty concentrating or making decisions? 0 0  Walking or climbing stairs? 0 1  Comment no stairs in the home   Dressing or bathing? 0 0  Doing errands, shopping? 0 0  Preparing Food and eating ? N N  Using the Toilet? N N  In the past six months, have you accidently leaked urine? N N  Do you have problems with loss of bowel control? N N  Managing your Medications? N N  Managing your Finances? N N  Housekeeping or managing your Housekeeping? N N    Patient Care Team: Marin Olp, MD as PCP - General (Family Medicine) Wylene Simmer, MD as Consulting Physician (Orthopedic Surgery) Harold Hedge, Darrick Grinder, MD as Consulting Physician (Allergy and Immunology) Dermatology, [facility] as Consulting Physician Jerline Pain Mingo Amber, DO as Consulting Physician (Ophthalmology) Daleen Bo, PT as Physical Therapist (Physical Therapy)  Indicate any recent Medical Services you may have received from other than [facility] providers in the past year (date may be approximate).     Assessment:   This is a routine wellness examination for [place].  Hearing/Vision screen Hearing Screening - Comments:: Pt denies any hearing issues  Vision Screening - Comments:: Pt follows  up with Dr Sandre Kitty for annul eye exams   Dietary issues and exercise activities discussed: Current Exercise Habits: Home exercise routine, Type of exercise: walking, Time (Minutes): 45, Frequency (Times/Week): 5, Weekly Exercise (Minutes/Week): 225   Goals Addressed             This Visit's Progress    Patient Stated       Stay healthy        Depression Screen    01/22/2022    8:51 AM 01/20/2021    8:31 AM 01/02/2021    8:58 AM 07/20/2020    8:23 AM 01/19/2020   10:05 AM 12/21/2019    8:54 AM 07/14/2019    9:43 AM  PHQ 2/9 Scores  PHQ - 2 Score 0 0 0 0 0 0 0  PHQ- 9 Score  3         Fall Risk    01/22/2022    8:53 AM 01/18/2022    3:14 PM 01/20/2021    8:21 AM 01/02/2021    8:59 AM 07/20/2020    8:23 AM  [place] in the past year? 0 0 0 0 0  Number falls in past yr: 0 0 0 0 0  Injury with Fall? 0 0 0 0 0  Risk for fall due to : Impaired vision;Impaired balance/gait   Impaired vision;Impaired balance/gait No Fall Risks  Follow up Falls prevention discussed   Falls prevention discussed Falls evaluation completed    FALL RISK PREVENTION PERTAINING TO THE HOME:  Any stairs in or around the home? No  If so, are there any without handrails? No  Home free of loose throw rugs in walkways, pet beds, electrical cords, etc? Yes  Adequate lighting in your home  to reduce risk of falls? Yes   ASSISTIVE DEVICES UTILIZED TO PREVENT FALLS:  Life alert? No  Use of a cane, walker or w/c? No  Grab bars in the bathroom? Yes  Shower chair or bench in shower? Yes  Elevated toilet seat or a handicapped toilet? No   TIMED UP AND GO:  Was the test performed? Yes .  Length of time to ambulate 10 feet: 20 sec.   Gait steady and fast without use of assistive device  Cognitive Function:        01/22/2022    8:55 AM 01/02/2021    9:01 AM 12/21/2019    9:00 AM 10/31/2018   10:33 AM  6CIT Screen  What Year? 0 points 0 points 0 points 0 points  What month? 0 points 0 points 0  points 0 points  What time? 0 points 0 points  0 points  Count back from 20 0 points 0 points 0 points 0 points  Months in reverse 0 points 0 points 0 points 0 points  Repeat phrase 0 points 0 points 0 points 0 points  Total Score 0 points 0 points  0 points    Immunizations Immunization History  Administered Date(s) Administered   Fluad Quad(high Dose 65+) 10/14/2018, 10/20/2021   Influenza Split 11/21/2011   Influenza Whole 11/18/2008, 01/11/2010   Influenza, High Dose Seasonal PF 12/20/2015, 11/19/2016, 02/01/2017, 11/19/2017   Influenza,inj,Quad PF,6+ Mos 11/11/2012, 11/18/2013, 12/07/2014   Influenza-Unspecified 10/26/2019, 11/03/2020   PFIZER(Purple Top)SARS-COV-2 Vaccination 02/20/2019, 03/17/2019, 09/01/2019, 10/16/2019, 04/29/2020   Pfizer Covid-19 Vaccine Bivalent Booster 36yr & up 10/11/2020, 10/16/2021   Pneumococcal Conjugate-13 02/15/2015   Pneumococcal Polysaccharide-23 02/16/2016, 02/01/2017, 09/01/2019   Td 01/15/2005   Tdap 10/10/2016   Zoster Recombinat (Shingrix) 05/21/2017, 08/13/2017   Zoster, Live 07/14/2012    TDAP status: Up to date  Flu Vaccine status: Up to date  Pneumococcal vaccine status: Up to date  Covid-19 vaccine status: Completed vaccines  Qualifies for Shingles Vaccine? Yes   Zostavax completed Yes   Shingrix Completed?: Yes  Screening Tests Health Maintenance  Topic Date Due   COVID-19 Vaccine (8 - 2023-24 season) 12/11/2021   Medicare Annual Wellness (AWV)  01/23/2023   DTaP/Tdap/Td (3 - Td or Tdap) 10/11/2026   COLONOSCOPY (Pts 45-465yrInsurance coverage will need to be confirmed)  08/25/2030   Pneumonia Vaccine 6577Years old  Completed   INFLUENZA VACCINE  Completed   Zoster Vaccines- Shingrix  Completed   HPV VACCINES  Aged Out    Health Maintenance  Health Maintenance Due  Topic Date Due   COVID-19 Vaccine (8 - 2023-24 season) 12/11/2021    Colorectal cancer screening: Type of screening: Colonoscopy. Completed  08/24/20. Repeat every 10 years   Additional Screening:   Vision Screening: Recommended annual ophthalmology exams for early detection of glaucoma and other disorders of the eye. Is the patient up to date with their annual eye exam?  Yes  Who is the provider or what is the name of the office in which the patient attends annual eye exams? Dr SuSandre Kittyf pt is not established with a provider, would they like to be referred to a provider to establish care? No .   Dental Screening: Recommended annual dental exams for proper oral hygiene  Community Resource Referral / Chronic Care Management: CRR required this visit?  No   CCM required this visit?  No      Plan:     I have personally reviewed and noted  the following in the patient's chart:   Medical and social history Use of alcohol, tobacco or illicit drugs  Current medications and supplements including opioid prescriptions. Patient is not currently taking opioid prescriptions. Functional ability and status Nutritional status Physical activity Advanced directives List of other physicians Hospitalizations, surgeries, and ER visits in previous 12 months Vitals Screenings to include cognitive, depression, and falls Referrals and appointments  In addition, I have reviewed and discussed with patient certain preventive protocols, quality metrics, and best practice recommendations. A written personalized care plan for preventive services as well as general preventive health recommendations were provided to patient.     Willette Brace, LPN   04/24/4494   Nurse Notes: none

## 2022-01-22 NOTE — Patient Instructions (Signed)
Matthew Watson , Thank you for taking time to come for your Medicare Wellness Visit. I appreciate your ongoing commitment to your health goals. Please review the following plan we discussed and let me know if I can assist you in the future.   These are the goals we discussed:  Goals      Exercise 150 min/wk Moderate Activity     Stretching in the am  Riding the bike in the gym; wife will retire in 30 days and you both can go to the gym     Patient Stated     Lose weight from thanksgiving     Patient Stated     Lose weight         This is a list of the screening recommended for you and due dates:  Health Maintenance  Topic Date Due   COVID-19 Vaccine (8 - 2023-24 season) 12/11/2021   Medicare Annual Wellness Visit  01/23/2023   DTaP/Tdap/Td vaccine (3 - Td or Tdap) 10/11/2026   Colon Cancer Screening  08/25/2030   Pneumonia Vaccine  Completed   Flu Shot  Completed   Zoster (Shingles) Vaccine  Completed   HPV Vaccine  Aged Out    Advanced directives: Please bring a copy of your health care power of attorney and living will to the office at your convenience.  Conditions/risks identified: stay healthy   Next appointment: Follow up in one year for your annual wellness visit.   Preventive Care 72 Years and Older, Male  Preventive care refers to lifestyle choices and visits with your health care provider that can promote health and wellness. What does preventive care include? A yearly physical exam. This is also called an annual well check. Dental exams once or twice a year. Routine eye exams. Ask your health care provider how often you should have your eyes checked. Personal lifestyle choices, including: Daily care of your teeth and gums. Regular physical activity. Eating a healthy diet. Avoiding tobacco and drug use. Limiting alcohol use. Practicing safe sex. Taking low doses of aspirin every day. Taking vitamin and mineral supplements as recommended by your health care  provider. What happens during an annual well check? The services and screenings done by your health care provider during your annual well check will depend on your age, overall health, lifestyle risk factors, and family history of disease. Counseling  Your health care provider may ask you questions about your: Alcohol use. Tobacco use. Drug use. Emotional well-being. Home and relationship well-being. Sexual activity. Eating habits. History of falls. Memory and ability to understand (cognition). Work and work Statistician. Screening  You may have the following tests or measurements: Height, weight, and BMI. Blood pressure. Lipid and cholesterol levels. These may be checked every 5 years, or more frequently if you are over 35 years old. Skin check. Lung cancer screening. You may have this screening every year starting at age 21 if you have a 30-pack-year history of smoking and currently smoke or have quit within the past 15 years. Fecal occult blood test (FOBT) of the stool. You may have this test every year starting at age 72. Flexible sigmoidoscopy or colonoscopy. You may have a sigmoidoscopy every 5 years or a colonoscopy every 10 years starting at age 72. Prostate cancer screening. Recommendations will vary depending on your family history and other risks. Hepatitis C blood test. Hepatitis B blood test. Sexually transmitted disease (STD) testing. Diabetes screening. This is done by checking your blood sugar (glucose) after you have  not eaten for a while (fasting). You may have this done every 1-3 years. Abdominal aortic aneurysm (AAA) screening. You may need this if you are a current or former smoker. Osteoporosis. You may be screened starting at age 72 if you are at high risk. Talk with your health care provider about your test results, treatment options, and if necessary, the need for more tests. Vaccines  Your health care provider may recommend certain vaccines, such  as: Influenza vaccine. This is recommended every year. Tetanus, diphtheria, and acellular pertussis (Tdap, Td) vaccine. You may need a Td booster every 10 years. Zoster vaccine. You may need this after age 72. Pneumococcal 13-valent conjugate (PCV13) vaccine. One dose is recommended after age 72. Pneumococcal polysaccharide (PPSV23) vaccine. One dose is recommended after age 72. Talk to your health care provider about which screenings and vaccines you need and how often you need them. This information is not intended to replace advice given to you by your health care provider. Make sure you discuss any questions you have with your health care provider. Document Released: 01/28/2015 Document Revised: 09/21/2015 Document Reviewed: 11/02/2014 Elsevier Interactive Patient Education  2017 [employer] Prevention in the Home Falls can cause injuries. They can happen to people of all ages. There are many things you can do to make your home safe and to help prevent falls. What can I do on the outside of my home? 72. Regularly fix the edges of walkways and driveways and fix any cracks. Remove anything that might make you trip as you walk through a door, such as a raised step or threshold. Trim any bushes or trees on the path to your home. Use bright outdoor lighting. Clear any walking paths of anything that might make someone trip, such as rocks or tools. Regularly check to see if handrails are loose or broken. Make sure that both sides of any steps have handrails. Any raised decks and porches should have guardrails on the edges. Have any leaves, snow, or ice cleared regularly. Use sand or salt on walking paths during winter. Clean up any spills in your garage right away. This includes oil or grease spills. What can I do in the bathroom? Use night lights. Install grab bars by the toilet and in the tub and shower. Do not use towel bars as grab bars. Use non-skid mats or decals in the tub or  shower. If you need to sit down in the shower, use a plastic, non-slip stool. Keep the floor dry. Clean up any water that spills on the floor as soon as it happens. Remove soap buildup in the tub or shower regularly. Attach bath mats securely with double-sided non-slip rug tape. Do not have throw rugs and other things on the floor that can make you trip. What can I do in the bedroom? Use night lights. Make sure that you have a light by your bed that is easy to reach. Do not use any sheets or blankets that are too big for your bed. They should not hang down onto the floor. Have a firm chair that has side arms. You can use this for support while you get dressed. Do not have throw rugs and other things on the floor that can make you trip. What can I do in the kitchen? Clean up any spills right away. Avoid walking on wet floors. Keep items that you use a lot in easy-to-reach places. If you need to reach something above you, use a strong step stool  that has a grab bar. Keep electrical cords out of the way. Do not use floor polish or wax that makes floors slippery. If you must use wax, use non-skid floor wax. Do not have throw rugs and other things on the floor that can make you trip. What can I do with my stairs? Do not leave any items on the stairs. Make sure that there are handrails on both sides of the stairs and use them. Fix handrails that are broken or loose. Make sure that handrails are as long as the stairways. Check any carpeting to make sure that it is firmly attached to the stairs. Fix any carpet that is loose or worn. Avoid having throw rugs at the top or bottom of the stairs. If you do have throw rugs, attach them to the floor with carpet tape. Make sure that you have a light switch at the top of the stairs and the bottom of the stairs. If you do not have them, ask someone to add them for you. What else can I do to help prevent falls? Wear shoes that: Do not have high heels. Have  rubber bottoms. Are comfortable and fit you well. Are closed at the toe. Do not wear sandals. If you use a stepladder: Make sure that it is fully opened. Do not climb a closed stepladder. Make sure that both sides of the stepladder are locked into place. Ask someone to hold it for you, if possible. Clearly mark and make sure that you can see: Any grab bars or handrails. First and last steps. Where the edge of each step is. Use tools that help you move around (mobility aids) if they are needed. These include: Canes. Walkers. Scooters. Crutches. Turn on the lights when you go into a dark area. Replace any light bulbs as soon as they burn out. Set up your furniture so you have a clear path. Avoid moving your furniture around. If any of your floors are uneven, fix them. If there are any pets around you, be aware of where they are. Review your medicines with your doctor. Some medicines can make you feel dizzy. This can increase your chance of falling. Ask your doctor what other things that you can do to help prevent falls. This information is not intended to replace advice given to you by your health care provider. Make sure you discuss any questions you have with your health care provider. Document Released: 10/28/2008 Document Revised: 06/09/2015 Document Reviewed: 02/05/2014 Elsevier Interactive Patient Education  2017 [employer].

## 2022-01-24 ENCOUNTER — Ambulatory Visit (INDEPENDENT_AMBULATORY_CARE_PROVIDER_SITE_OTHER): Payer: Medicare PPO | Admitting: Family Medicine

## 2022-01-24 ENCOUNTER — Encounter: Payer: Self-pay | Admitting: Family Medicine

## 2022-01-24 VITALS — BP 128/70 | HR 76 | Temp 98.3°F | Ht 66.0 in | Wt 200.8 lb

## 2022-01-24 DIAGNOSIS — E785 Hyperlipidemia, unspecified: Secondary | ICD-10-CM

## 2022-01-24 DIAGNOSIS — R351 Nocturia: Secondary | ICD-10-CM

## 2022-01-24 DIAGNOSIS — N401 Enlarged prostate with lower urinary tract symptoms: Secondary | ICD-10-CM | POA: Diagnosis not present

## 2022-01-24 DIAGNOSIS — Z0000 Encounter for general adult medical examination without abnormal findings: Secondary | ICD-10-CM

## 2022-01-24 DIAGNOSIS — Z79899 Other long term (current) drug therapy: Secondary | ICD-10-CM

## 2022-01-24 DIAGNOSIS — I700 Atherosclerosis of aorta: Secondary | ICD-10-CM

## 2022-01-24 LAB — COMPREHENSIVE METABOLIC PANEL
ALT: 19 U/L (ref 0–53)
AST: 17 U/L (ref 0–37)
Albumin: 4.3 g/dL (ref 3.5–5.2)
Alkaline Phosphatase: 98 U/L (ref 39–117)
BUN: 17 mg/dL (ref 6–23)
CO2: 26 mEq/L (ref 19–32)
Calcium: 9.2 mg/dL (ref 8.4–10.5)
Chloride: 105 mEq/L (ref 96–112)
Creatinine, Ser: 0.89 mg/dL (ref 0.40–1.50)
GFR: 85.94 mL/min (ref >60.00)
Glucose, Bld: 104 mg/dL — ABNORMAL HIGH (ref 70–99)
Potassium: 4 mEq/L (ref 3.5–5.1)
Sodium: 141 mEq/L (ref 135–145)
Total Bilirubin: 0.8 mg/dL (ref 0.2–1.2)
Total Protein: 6.8 g/dL (ref 6.0–8.3)

## 2022-01-24 LAB — CBC WITH DIFFERENTIAL/PLATELET
Basophils Absolute: 0 10*3/uL (ref 0.0–0.1)
Basophils Relative: 0.5 % (ref 0.0–3.0)
Eosinophils Absolute: 0.1 10*3/uL (ref 0.0–0.7)
Eosinophils Relative: 1.2 % (ref 0.0–5.0)
HCT: 44.3 % (ref 39.0–52.0)
Hemoglobin: 14.8 g/dL (ref 13.0–17.0)
Lymphocytes Relative: 18.9 % (ref 12.0–46.0)
Lymphs Abs: 1.4 10*3/uL (ref 0.7–4.0)
MCHC: 33.5 g/dL (ref 30.0–36.0)
MCV: 86.7 fl (ref 78.0–100.0)
Monocytes Absolute: 0.7 10*3/uL (ref 0.1–1.0)
Monocytes Relative: 8.5 % (ref 3.0–12.0)
Neutro Abs: 5.4 10*3/uL (ref 1.4–7.7)
Neutrophils Relative %: 70.9 % (ref 43.0–77.0)
Platelets: 236 10*3/uL (ref 150.0–400.0)
RBC: 5.11 Mil/uL (ref 4.22–5.81)
RDW: 13.3 % (ref 11.5–15.5)
WBC: 7.6 10*3/uL (ref 4.0–10.5)

## 2022-01-24 LAB — LIPID PANEL
Cholesterol: 152 mg/dL (ref 0–200)
HDL: 59.2 mg/dL (ref >39.00)
LDL Cholesterol: 68 mg/dL (ref 0–99)
NonHDL: 92.34
Total CHOL/HDL Ratio: 3
Triglycerides: 122 mg/dL (ref 0.0–149.0)
VLDL: 24.4 mg/dL (ref 0.0–40.0)

## 2022-01-24 LAB — PSA: PSA: 0.18 ng/mL (ref 0.10–4.00)

## 2022-01-24 LAB — VITAMIN B12: Vitamin B-12: 337 pg/mL (ref 211–911)

## 2022-01-24 NOTE — Progress Notes (Signed)
Phone: [phone]   Subjective:  Patient presents today for their annual physical. Chief complaint-noted.   See problem oriented charting- ROS- full  review of systems was completed and negative  except for: congestion- feels reflux related- more in morning, drooling, runny nose, sneexing- some allergies, tinnitus stable, eye itching, leg swelling but better than in the past, urinary urgency, joint pain- especially the ankle, cuts/scrapes with staying active  The following were reviewed and entered/updated in epic: Past Medical History:  Diagnosis Date   Adenomatous polyp of colon    Allergy    seasonal   Arthritis    BPH (benign prostatic hyperplasia)    Diverticulosis 09/2010   episode of diverticulitis   GERD (gastroesophageal reflux disease)    Hyperlipidemia    Hypertension    Low back pain    Patient Active Problem List   Diagnosis Date Noted   Chronic narcotic use 10/10/2016    Priority: High   Spinal stenosis 05/11/2020    Priority: Medium    Aortic atherosclerosis ([place]) 05/11/2020    Priority: Medium    Hiatal hernia 01/19/2020    Priority: Medium    Arthritis of right ankle 02/11/2014    Priority: Medium    BPH (benign prostatic hyperplasia) 12/17/2006    Priority: Medium    Hyperlipidemia 08/19/2006    Priority: Medium    Essential hypertension 08/19/2006    Priority: Medium    Allergic rhinitis 02/11/2014    Priority: Low   Family history of melanoma 02/11/2014    Priority: Low   GERD (gastroesophageal reflux disease) 02/11/2014    Priority: Low   Former smoker 02/11/2014    Priority: Low   INSOMNIA, CHRONIC 07/01/2009    Priority: Low   Actinic keratosis 12/17/2006    Priority: Low   History of colonic polyps 08/26/2006    Priority: Low   Pyogenic granuloma 08/21/2019    Priority: 1.   Mass of left finger 08/13/2019    Priority: 1.   Arthritis of left foot 08/27/2016    Priority: 1.   Past Surgical History:  Procedure Laterality Date    ANKLE FRACTURE SURGERY     chronic pain as result   COLONOSCOPY     FINGER SURGERY Left    bite by a crab   HERNIA REPAIR     jan 2012, [facility]    Family History  Problem Relation Age of Onset   Cancer Father        melanoma, cousin also died of it   Colon polyps Sister    Cancer Brother        melanoma   Cancer Paternal Uncle        Colon cancer   Colon cancer Paternal Uncle    Esophageal cancer Neg Hx    Stomach cancer Neg Hx    Rectal cancer Neg Hx     Medications- reviewed and updated Current Outpatient Medications  Medication Sig Dispense Refill   albuterol (VENTOLIN HFA) 108 (90 Base) MCG/ACT inhaler Inhale 1-2 puffs into the lungs every 6 (six) hours as needed for wheezing or shortness of breath. 1 each 0   amLODipine-benazepril (LOTREL) 5-20 MG capsule TAKE 1 CAPSULE BY MOUTH DAILY 90 capsule 1   atorvastatin (LIPITOR) 40 MG tablet TAKE 1 TABLET BY MOUTH DAILY 90 tablet 1   Azelastine HCl 0.15 % SOLN Place 1 spray into the nose as needed.     Cholecalciferol (VITAMIN D3 MAXIMUM STRENGTH) 125 MCG (5000 UT) capsule Take  1 capsule (5,000 Units total) by mouth daily. 90 capsule 3   Cyanocobalamin (VITAMIN B-12 PO) Take by mouth. Once weekly.     famotidine (PEPCID) 20 MG tablet Take 1 tablet (20 mg total) by mouth at bedtime.     finasteride (PROSCAR) 5 MG tablet TAKE ONE TABLET BY MOUTH DAILY 90 tablet 3   fluticasone (FLONASE) 50 MCG/ACT nasal spray SPRAY TWO SPRAYS IN EACH NOSTRIL ONCE DAILY 48 g 1   meloxicam (MOBIC) 7.5 MG tablet TAKE ONE TABLET BY MOUTH DAILY 90 tablet 1   omeprazole (PRILOSEC) 20 MG capsule TAKE ONE CAPSULE BY MOUTH EVERY MORNING 20 TO 30 MINUTES BEFORE BREAKFAST 90 capsule 3   No current facility-administered medications for this visit.    Allergies-reviewed and updated Allergies  Allergen Reactions   Dust Mite Mixed Allergen Ext [Mite (D. Farinae)]     Social History   Social History Narrative   Married >30 years in 2016. No children. 2  dogs.       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 Oropharynx normal Neck: no thyromegaly CV: RRR no murmurs rubs or gallops Lungs: CTAB no crackles, wheeze, rhonchi Abdomen: soft/nontender/nondistended/normal bowel sounds. No rebound or guarding.  Ext: no edema but certainly has bony hypertrophy of right ankle from prior surgery/arthritis Skin: warm, dry Neuro: grossly normal, moves all extremities, PERRLA    Assessment and Plan  72 y.o. male presenting for annual physical.  Health Maintenance counseling: 1. Anticipatory guidance: Patient counseled regarding regular dental exams -q6 months, eye exams-twiceyearly,  avoiding smoking and second hand smoke, limiting alcohol to 2 beverages per day- 10 beers a week sometimes white wine, no illicit drugs-sparing marijuana socially in the past- cut back due to reflux- was bothering him.   2. Risk factor reduction:  Advised patient of need for regular exercise and diet rich and fruits and vegetables to reduce risk of heart attack and stroke.  Exercise-limited by right ankle issues-[place]commend 150 minutes a week exercise- going tomorrow adopt a dog and may be able to walk some if ankle allows.  Diet/weight management-weight stable over the last year- lose 7-8 lbs then regain- cuts out sugars sweetened beverages/alcohol and eats lean protein and salad- does for 2 weeks and gets bored with it and comes back- hed like to get down 15 lbs in next year.  Wt Readings from Last 3 Encounters:  01/24/22 200 lb 12.8 oz (91.1 kg)  01/22/22 202 lb 12.8 oz (92 kg)  12/22/21 195 lb (88.5 kg)  3. Immunizations/screenings/ancillary studies-fully up-to-date  also had RSV  Immunization History  Administered Date(s) Administered   Fluad Quad(high Dose 65+) 10/14/2018, 10/20/2021   Influenza Split 11/21/2011   Influenza Whole 11/18/2008, 01/11/2010   Influenza, High Dose Seasonal PF 12/20/2015, 11/19/2016, 02/01/2017, 11/19/2017   Influenza,inj,Quad PF,6+ Mos 11/11/2012, 11/18/2013, 12/07/2014   Influenza-Unspecified 10/26/2019, 11/03/2020   PFIZER(Purple Top)SARS-COV-2 Vaccination 02/20/2019, 03/17/2019, 09/01/2019, 10/16/2019, 04/29/2020   Pfizer Covid-19 Vaccine Bivalent Booster 64yr & up 10/11/2020, 10/16/2021   Pneumococcal Conjugate-13 02/15/2015   Pneumococcal Polysaccharide-23 02/16/2016, 02/01/2017, 09/01/2019   Td  01/15/2005   Tdap 10/10/2016   Zoster Recombinat (Shingrix) 05/21/2017, 08/13/2017   Zoster, Live 07/14/2012  4. Prostate cancer screening- known BPH controlled by finasteride.  With ongoing finasteride will trend PSA Lab Results  Component Value Date   PSA 0.22 01/20/2021   PSA 0.17 05/11/2020   PSA 0.21 01/19/2020  5. Colon cancer screening - 08/24/20 with 10 year repeat planned - hx of colon polyps but not precancerous.  6. Skin cancer screening- yearly derm visit .  advised regular sunscreen use. Denies worrisome, changing, or new skin lesions.  7. Smoking associated screening (lung cancer screening, AAA screen 65-75, UA)- Former smoker- AAA screening -2019.  Quit 1990s-no regular screening required 8. STD screening - monogamous and not needed   Status of chronic or acute concerns   # Social update-Canning went well again this year-better than last year other than tomatoes.  Also makes jellies  #hypertension S: medication: Amlodipine -benazepril 5-20 mg daily BP Readings from Last 3 Encounters:  01/24/22 128/70  01/22/22 126/80  12/22/21 134/78  A/P: stable- continue current medicines    #hyperlipidemia # Aortic atherosclerosis/coronary artery calcium S: Medication:Atorvastatin 40 mg daily Lab Results   Component Value Date   CHOL 142 01/20/2021   HDL 50.30 01/20/2021   LDLCALC 69 01/20/2021   LDLDIRECT 90.0 02/11/2014   TRIG 117.0 01/20/2021   CHOLHDL 3 01/20/2021  A/P: at goal last year- hopefully stable- update lipids- continue current medications    # BPH S: Medication: Finasteride 5 mg daily  Nocturia once or twice a night-stable - occasional bad night A/P: stable- continue current medicines    # Chronic pain-right ankle arthritis and spinal stenosis (ankle the larger issue) S: Medication: Prior chronic narcotics but now off since 2019-tolerating meloxicam 7.5 mg-we discussed importance of monitoring renal function  A/P: manageable- continue current medications - update kidney function    # GERD with hiatal hernia S:Medication: Omeprazole 20 mg and needs Pepcid at times- supposed to take at night but doesn't always remember (congestion worse in am if misses) B12 levels related to PPI use: Low normal in 2021 and on once a week B12 in past- off recently so we will recheck A/P: reasonable stable- continue current medications    Recommended follow up: Return in about 6 months (around 07/25/2022) for followup or sooner if needed.Schedule b4 you leave. Future Appointments  Date Time Provider [place]  01/28/2023  8:45 AM [facility] HEALTH COACH [facility]   Lab/Order associations: fasting   ICD-10-CM   1. Preventative health care  Z00.00     2. Hyperlipidemia, unspecified hyperlipidemia type  E78.5 CBC with Differential/Platelet    Comprehensive metabolic panel    Lipid panel    3. Benign prostatic hyperplasia with nocturia  N40.1 PSA   R35.1     4. High risk medication use  Z79.899 Vitamin B12    5. Aortic atherosclerosis (HCC) Chronic I70.0       No orders of the defined types were placed in this encounter.   Return precautions advised.  Garret Reddish, MD

## 2022-01-24 NOTE — Patient Instructions (Addendum)
Send [place] date of RSV vaccine and we can load this  Please stop by lab before you go If you have mychart- we will send your results within 3 business days of [place] receiving them.  If you do not have mychart- we will call you about results within 5 business days of [place] receiving them.  *please also note that you will see labs on mychart as soon as they post. I will later go in and write notes on them- will say "notes from Dr. Yong Channel"   Recommended follow up: Return in about 6 months (around 07/25/2022) for followup or sooner if needed.Schedule b4 you leave.

## 2022-01-30 DIAGNOSIS — H40013 Open angle with borderline findings, low risk, bilateral: Secondary | ICD-10-CM | POA: Diagnosis not present

## 2022-01-30 DIAGNOSIS — H1045 Other chronic allergic conjunctivitis: Secondary | ICD-10-CM | POA: Diagnosis not present

## 2022-01-30 DIAGNOSIS — H2513 Age-related nuclear cataract, bilateral: Secondary | ICD-10-CM | POA: Diagnosis not present

## 2022-01-30 DIAGNOSIS — D3132 Benign neoplasm of left choroid: Secondary | ICD-10-CM | POA: Diagnosis not present

## 2022-03-06 DIAGNOSIS — L03312 Cellulitis of back [any part except buttock]: Secondary | ICD-10-CM | POA: Diagnosis not present

## 2022-03-06 DIAGNOSIS — L02212 Cutaneous abscess of back [any part, except buttock]: Secondary | ICD-10-CM | POA: Diagnosis not present

## 2022-03-09 DIAGNOSIS — L02212 Cutaneous abscess of back [any part, except buttock]: Secondary | ICD-10-CM | POA: Diagnosis not present

## 2022-04-05 DIAGNOSIS — L814 Other melanin hyperpigmentation: Secondary | ICD-10-CM | POA: Diagnosis not present

## 2022-04-05 DIAGNOSIS — L821 Other seborrheic keratosis: Secondary | ICD-10-CM | POA: Diagnosis not present

## 2022-04-05 DIAGNOSIS — D225 Melanocytic nevi of trunk: Secondary | ICD-10-CM | POA: Diagnosis not present

## 2022-04-05 DIAGNOSIS — L820 Inflamed seborrheic keratosis: Secondary | ICD-10-CM | POA: Diagnosis not present

## 2022-06-12 ENCOUNTER — Other Ambulatory Visit: Payer: Self-pay | Admitting: Family Medicine

## 2022-06-15 ENCOUNTER — Other Ambulatory Visit: Payer: Self-pay | Admitting: Family Medicine

## 2022-08-03 ENCOUNTER — Ambulatory Visit: Payer: Medicare PPO | Admitting: Family Medicine

## 2022-08-06 ENCOUNTER — Ambulatory Visit: Payer: Medicare PPO | Admitting: Family Medicine

## 2022-08-06 ENCOUNTER — Encounter: Payer: Self-pay | Admitting: Family Medicine

## 2022-08-06 VITALS — BP 132/80 | HR 74 | Temp 98.1°F | Ht 66.0 in | Wt 177.6 lb

## 2022-08-06 DIAGNOSIS — E785 Hyperlipidemia, unspecified: Secondary | ICD-10-CM

## 2022-08-06 DIAGNOSIS — I10 Essential (primary) hypertension: Secondary | ICD-10-CM | POA: Diagnosis not present

## 2022-08-06 DIAGNOSIS — I700 Atherosclerosis of aorta: Secondary | ICD-10-CM

## 2022-08-06 LAB — CBC WITH DIFFERENTIAL/PLATELET
Basophils Absolute: 0.1 10*3/uL (ref 0.0–0.1)
Basophils Relative: 0.6 % (ref 0.0–3.0)
Eosinophils Absolute: 0.1 10*3/uL (ref 0.0–0.7)
Eosinophils Relative: 1.3 % (ref 0.0–5.0)
HCT: 44.2 % (ref 39.0–52.0)
Hemoglobin: 14.8 g/dL (ref 13.0–17.0)
Lymphocytes Relative: 16.8 % (ref 12.0–46.0)
Lymphs Abs: 1.6 10*3/uL (ref 0.7–4.0)
MCHC: 33.4 g/dL (ref 30.0–36.0)
MCV: 86.6 fl (ref 78.0–100.0)
Monocytes Absolute: 0.7 10*3/uL (ref 0.1–1.0)
Monocytes Relative: 7.8 % (ref 3.0–12.0)
Neutro Abs: 6.8 10*3/uL (ref 1.4–7.7)
Neutrophils Relative %: 73.5 % (ref 43.0–77.0)
Platelets: 235 10*3/uL (ref 150.0–400.0)
RBC: 5.1 Mil/uL (ref 4.22–5.81)
RDW: 13.5 % (ref 11.5–15.5)
WBC: 9.3 10*3/uL (ref 4.0–10.5)

## 2022-08-06 LAB — COMPREHENSIVE METABOLIC PANEL
ALT: 14 U/L (ref 0–53)
AST: 16 U/L (ref 0–37)
Albumin: 4.1 g/dL (ref 3.5–5.2)
Alkaline Phosphatase: 84 U/L (ref 39–117)
BUN: 19 mg/dL (ref 6–23)
CO2: 24 mEq/L (ref 19–32)
Calcium: 9.3 mg/dL (ref 8.4–10.5)
Chloride: 105 mEq/L (ref 96–112)
Creatinine, Ser: 0.76 mg/dL (ref 0.40–1.50)
GFR: 89.8 mL/min (ref >60.00)
Glucose, Bld: 97 mg/dL (ref 70–99)
Potassium: 3.7 mEq/L (ref 3.5–5.1)
Sodium: 138 mEq/L (ref 135–145)
Total Bilirubin: 0.8 mg/dL (ref 0.2–1.2)
Total Protein: 6.7 g/dL (ref 6.0–8.3)

## 2022-08-06 NOTE — Patient Instructions (Addendum)
Let us know if you get any COVID vaccines this fall.  Reflux imperefectly controlled- he is going to try the Pepcid before dinner- if that doesn't help may need to consider higher dose of omeprazole   -thrilled he's walking more- weight loss phenomenal! Congratulated on efforts  Please stop by lab before you go If you have mychart- we will send your results within 3 business days of [place] receiving them.  If you do not have mychart- we will call you about results within 5 business days of [place] receiving them.  *please also note that you will see labs on mychart as soon as they post. I will later go in and write notes on them- will say "notes from Dr. Durene Cal"    Recommended follow up: Return in about 6 months (around 02/06/2023) for physical or sooner if needed.Schedule b4 you leave.

## 2022-08-06 NOTE — Progress Notes (Signed)
Phone [phone] In person visit   Subjective:   Matthew Watson is a 72 y.o. year old very pleasant male patient who presents for/with See problem oriented charting Chief Complaint  Patient presents with   Medical Management of Chronic Issues   Hyperlipidemia   Hypertension   Past Medical History-  Patient Active Problem List   Diagnosis Date Noted   Chronic narcotic use 10/10/2016    Priority: High   Spinal stenosis 05/11/2020    Priority: Medium    Aortic atherosclerosis (HCC) 05/11/2020    Priority: Medium    Hiatal hernia 01/19/2020    Priority: Medium    Arthritis of right ankle 02/11/2014    Priority: Medium    BPH (benign prostatic hyperplasia) 12/17/2006    Priority: Medium    Hyperlipidemia 08/19/2006    Priority: Medium    Essential hypertension 08/19/2006    Priority: Medium    Allergic rhinitis 02/11/2014    Priority: Low   Family history of melanoma 02/11/2014    Priority: Low   GERD (gastroesophageal reflux disease) 02/11/2014    Priority: Low   Former smoker 02/11/2014    Priority: Low   INSOMNIA, CHRONIC 07/01/2009    Priority: Low   Actinic keratosis 12/17/2006    Priority: Low   History of colonic polyps 08/26/2006    Priority: Low   Pyogenic granuloma 08/21/2019    Priority: 1.   Mass of left finger 08/13/2019    Priority: 1.   Arthritis of left foot 08/27/2016    Priority: 1.    Medications- reviewed and updated Current Outpatient Medications  Medication Sig Dispense Refill   amLODipine-benazepril (LOTREL) 5-20 MG capsule TAKE 1 CAPSULE BY MOUTH DAILY 90 capsule 1   atorvastatin (LIPITOR) 40 MG tablet TAKE 1 TABLET BY MOUTH DAILY 90 tablet 1   Azelastine HCl 0.15 % SOLN Place 1 spray into the nose as needed.     Cholecalciferol (VITAMIN D3 MAXIMUM STRENGTH) 125 MCG (5000 UT) capsule Take 1 capsule (5,000 Units total) by mouth daily. 90 capsule 3   Cyanocobalamin (VITAMIN B-12 PO) Take by mouth. Once weekly.     famotidine  (PEPCID) 20 MG tablet Take 1 tablet (20 mg total) by mouth at bedtime.     finasteride (PROSCAR) 5 MG tablet TAKE ONE TABLET BY MOUTH DAILY 90 tablet 3   fluticasone (FLONASE) 50 MCG/ACT nasal spray SPRAY TWO SPRAYS IN EACH NOSTRIL ONCE DAILY 48 g 1   meloxicam (MOBIC) 7.5 MG tablet TAKE 1 TABLET BY MOUTH DAILY 90 tablet 1   omeprazole (PRILOSEC) 20 MG capsule TAKE ONE CAPSULE BY MOUTH EVERY MORNING 20 TO 30 MINUTES BEFORE BREAKFAST 90 capsule 3   albuterol (VENTOLIN HFA) 108 (90 Base) MCG/ACT inhaler Inhale 1-2 puffs into the lungs every 6 (six) hours as needed for wheezing or shortness of breath. 1 each 0   No current facility-administered medications for this visit.     Objective:  BP 132/80   Pulse 74   Temp 98.1 F (36.7 C)   Ht 5\' 6"  (1.676 m)   Wt 177 lb 9.6 oz (80.6 kg)   SpO2 98%   BMI 28.67 kg/m  Gen: NAD, resting comfortably CV: RRR no murmurs rubs or gallops Lungs: CTAB no crackles, wheeze, rhonchi Abdomen: soft/nontender/nondistended/normal bowel sounds. No rebound or guarding.  Ext: trace edema Skin: warm, dry Neuro: grossly normal, moves all extremities    Assessment and Plan   #hypertension S: medication: amlodipine benazepril 5-20 mg Home  readings #s: 130s/80's usually A/P: reasonable control- continue current medications   #hyperlipidemia #aortic atherosclerosis with coronary artery calcium noted on imaging S: Medication: atorvastatin 40 mg - he has worked on diet aggressively and is down 23 lbs! Cut down on portions and meat content- trying to eat healthier foods- less meats in am and trying of the do more high fiber meals -walking with dog 3-4 x a day with dog- ankle tolerating Lab Results  Component Value Date   CHOL 152 01/24/2022   HDL 59.20 01/24/2022   LDLCALC 68 01/24/2022   LDLDIRECT 90.0 02/11/2014   TRIG 122.0 01/24/2022   CHOLHDL 3 01/24/2022  A/P: cholesterol stable- continue current medicines - at goal with LDL under 70 for aortic  atherosclerosis and even for coronary artery calcium -thrilled he's walking more- weight loss phenomenal! Congratulated on efforts -he may try to get to 155 but would focus more on   # Chronic pain-right ankle arthritis and spinal stenosis (ankle the larger issue) S: Medication: Prior chronic narcotics but now off since 2019-tolerating meloxicam 7.5 mg-we discussed importance of monitoring renal function q6 months   A/P: continues to deal with pain but tolerable- continue current medications but check renal function on meloxicam 7.5 mg  # BPH S:Medication: finasteride 5 mg A/P: imperfect control but tolerable- continue current medications    # GERD S:Medication: omeprazole 20 mg in the morning and Pepcid before bed if remembers  B12 levels related to PPI use: taking B12 weekly 1000 mcg as of January 2024 (B12 under 350) - gets flares at times- has to sleep in chair at times and annoys him A/P: GERD imperefectly controlled- he is going to try the Pepcid before dinner- if that doesn't help may need to consider higher dose of omeprazole  - I would say if we can't get this settled down would make sense to go back and chat with gastroenterology   #allergies- on Flonase - helpful  Recommended follow up: Return in about 6 months (around 02/06/2023) for physical or sooner if needed.Schedule b4 you leave. Future Appointments  Date Time Provider Department Center  01/28/2023  8:45 AM [facility] ANNUAL WELLNESS VISIT 1 [facility]  01/29/2023  8:20 AM Durene Cal, Aldine Contes, MD [facility]   Lab/Order associations:   ICD-10-CM   1. Essential hypertension  I10     2. Hyperlipidemia, unspecified hyperlipidemia type  E78.5     3. Aortic atherosclerosis (HCC)  I70.0      No orders of the defined types were placed in this encounter.  Return precautions advised.  Tana Conch, MD

## 2022-08-08 DIAGNOSIS — H5203 Hypermetropia, bilateral: Secondary | ICD-10-CM | POA: Diagnosis not present

## 2022-08-08 DIAGNOSIS — H52223 Regular astigmatism, bilateral: Secondary | ICD-10-CM | POA: Diagnosis not present

## 2022-08-08 DIAGNOSIS — H524 Presbyopia: Secondary | ICD-10-CM | POA: Diagnosis not present

## 2022-08-08 DIAGNOSIS — H1045 Other chronic allergic conjunctivitis: Secondary | ICD-10-CM | POA: Diagnosis not present

## 2022-08-08 DIAGNOSIS — H40013 Open angle with borderline findings, low risk, bilateral: Secondary | ICD-10-CM | POA: Diagnosis not present

## 2022-08-08 DIAGNOSIS — H2513 Age-related nuclear cataract, bilateral: Secondary | ICD-10-CM | POA: Diagnosis not present

## 2022-08-08 DIAGNOSIS — H02834 Dermatochalasis of left upper eyelid: Secondary | ICD-10-CM | POA: Diagnosis not present

## 2022-09-09 ENCOUNTER — Other Ambulatory Visit: Payer: Self-pay | Admitting: Family Medicine

## 2022-09-09 ENCOUNTER — Other Ambulatory Visit: Payer: Self-pay | Admitting: Gastroenterology

## 2022-09-10 NOTE — Telephone Encounter (Signed)
Dr Lavon Paganini,   Please advise if OK to refill as you are DOD am.  This is a patient of Dr Christella Hartigan.

## 2022-09-12 ENCOUNTER — Other Ambulatory Visit: Payer: Self-pay

## 2022-09-12 MED ORDER — FINASTERIDE 5 MG PO TABS: 5.0000 mg | ORAL_TABLET | Freq: Every day | ORAL | 3 refills | Status: DC

## 2022-09-19 ENCOUNTER — Encounter: Payer: Self-pay | Admitting: Family Medicine

## 2022-09-19 ENCOUNTER — Other Ambulatory Visit: Payer: Self-pay

## 2022-09-19 MED ORDER — OMEPRAZOLE 20 MG PO CPDR: DELAYED_RELEASE_CAPSULE | ORAL | 3 refills | Status: DC

## 2022-11-30 ENCOUNTER — Encounter: Payer: Self-pay | Admitting: Family Medicine

## 2022-11-30 MED ORDER — KETOCONAZOLE 2 % EX CREA: 1.0000 | TOPICAL_CREAM | Freq: Every day | CUTANEOUS | 0 refills | Status: DC

## 2022-12-09 ENCOUNTER — Other Ambulatory Visit: Payer: Self-pay | Admitting: Family Medicine

## 2023-01-28 ENCOUNTER — Ambulatory Visit (INDEPENDENT_AMBULATORY_CARE_PROVIDER_SITE_OTHER): Payer: Medicare PPO

## 2023-01-28 VITALS — BP 128/78 | HR 73 | Wt 179.0 lb

## 2023-01-28 DIAGNOSIS — Z0000 Encounter for general adult medical examination without abnormal findings: Secondary | ICD-10-CM

## 2023-01-28 NOTE — Progress Notes (Signed)
 Subjective:   Matthew Watson is a 73 y.o. male who presents for Medicare Annual/Subsequent preventive examination.  Visit Complete: In person  Patient Medicare AWV questionnaire was completed by the patient on 01/27/23; I have confirmed that all information answered by patient is correct and no changes since this date.  Cardiac Risk Factors include: advanced age (>57men, >73 women);dyslipidemia;male gender;hypertension     Objective:    Today's Vitals   01/28/23 0831  BP: 128/78  Pulse: 73  SpO2: 96%  Weight: 179 lb (81.2 kg)   Body mass index is 28.89 kg/m.     01/28/2023    8:40 AM 01/22/2022    8:52 AM 09/23/2021   12:10 PM 01/02/2021    8:58 AM 04/23/2020    8:16 AM 03/01/2020    9:38 AM 12/21/2019    8:56 AM  Advanced Directives  Does Patient Have a Medical Advance Directive? Yes Yes No Yes Yes Yes Yes  Type of Estate Agent of [place];Living will Healthcare Power of [place];Living will  Healthcare Power of [place] of [place];Living will Living will;Healthcare Power of Attorney Living will;Healthcare Power of Attorney  Does patient want to make changes to medical advance directive?     No - Patient declined No - Patient declined   Copy of Healthcare Power of Attorney in Chart? No - copy requested No - copy requested  No - copy requested No - copy requested No - copy requested No - copy requested    Current Medications (verified) Outpatient Encounter Medications as of 01/28/2023  Medication Sig   amLODipine -benazepril  (LOTREL) 5-20 MG capsule TAKE 1 CAPSULE BY MOUTH DAILY   atorvastatin  (LIPITOR) 40 MG tablet TAKE 1 TABLET BY MOUTH DAILY   Azelastine HCl 0.15 % SOLN Place 1 spray into the nose as needed.   Cholecalciferol (VITAMIN D3 MAXIMUM STRENGTH) 125 MCG (5000 UT) capsule Take 1 capsule (5,000 Units total) by mouth daily.   COMIRNATY syringe    Cyanocobalamin  (VITAMIN B-12 PO) Take by mouth. Once weekly.   famotidine   (PEPCID ) 20 MG tablet Take 1 tablet (20 mg total) by mouth at bedtime.   finasteride  (PROSCAR ) 5 MG tablet Take 1 tablet (5 mg total) by mouth daily.   fluticasone  (FLONASE ) 50 MCG/ACT nasal spray SPRAY TWO SPRAYS IN EACH NOSTRIL ONCE DAILY   FLUZONE HIGH-DOSE 0.5 ML injection    ketoconazole  (NIZORAL ) 2 % cream Apply 1 Application topically daily.   meloxicam  (MOBIC ) 7.5 MG tablet TAKE 1 TABLET BY MOUTH DAILY   omeprazole  (PRILOSEC) 20 MG capsule TAKE ONE CAPSULE BY MOUTH EVERY MORNING 20 TO 30 MINUTES BEFORE BREAKFAST   No facility-administered encounter medications on file as of 01/28/2023.    Allergies (verified) Dust mite mixed allergen ext [mite (d. farinae)]   History: Past Medical History:  Diagnosis Date   Adenomatous polyp of colon    Allergy    seasonal   Arthritis    BPH (benign prostatic hyperplasia)    Diverticulosis 09/2010   episode of diverticulitis   GERD (gastroesophageal reflux disease)    Hyperlipidemia    Hypertension    Low back pain    Past Surgical History:  Procedure Laterality Date   ANKLE FRACTURE SURGERY     chronic pain as result   COLONOSCOPY     FINGER SURGERY Left    bite by a crab   HERNIA REPAIR     jan 2012, [facility]   Family History  Problem Relation Age of  Onset   Cancer Father        melanoma, cousin also died of it   Colon polyps Sister    Cancer Brother        melanoma   Cancer Paternal Uncle        Colon cancer   Colon cancer Paternal Uncle    Esophageal cancer Neg Hx    Stomach cancer Neg Hx    Rectal cancer Neg Hx    Social History   Socioeconomic History   Marital status: Married    Spouse name: Not on file   Number of children: Not on file   Years of education: Not on file   Highest education level: GED or equivalent  Occupational History   Occupation: retired  Tobacco Use   Smoking status: Former    Current packs/day: 0.00    Average packs/day: 0.3 packs/day for 10.0 years (2.5 ttl pk-yrs)    Types:  Cigarettes    Start date: 03/22/1980    Quit date: 03/23/1990    Years since quitting: 32.8   Smokeless tobacco: Never   Tobacco comments:    quit in 1992  Vaping Use   Vaping status: Never Used  Substance and Sexual Activity   Alcohol use: Yes    Alcohol/week: 6.0 standard drinks of alcohol    Types: 6 Cans of beer per week   Drug use: No   Sexual activity: Yes  Other Topics Concern   Not on file  Social History Narrative   Married >30 years in 2016. No children. 2 dogs.       Retired herbalist heavy [employer]      Hobbies: Rabbit hunting, fishing, duck hunt   Social Drivers of Health   Financial Resource Strain: Low Risk  (01/28/2023)   Overall Financial Resource Strain (CARDIA)    Difficulty of Paying Living Expenses: Not hard at all  Food Insecurity: No Food Insecurity (01/28/2023)   Hunger Vital Sign    Worried About Running Out of Food in the Last Year: Never true    Ran Out of Food in the Last Year: Never true  Transportation Needs: No Transportation Needs (01/28/2023)   PRAPARE - Administrator, Civil Service (Medical): No    Lack of Transportation (Non-Medical): No  Physical Activity: Sufficiently Active (01/28/2023)   Exercise Vital Sign    Days of Exercise per Week: 7 days    Minutes of Exercise per Session: 50 min  Stress: No Stress Concern Present (01/28/2023)   [employer] of Occupational Health - Occupational Stress Questionnaire    Feeling of Stress : Not at all  Social Connections: Moderately Integrated (01/28/2023)   Social Connection and Isolation Panel [NHANES]    Frequency of Communication with Friends and Family: More than three times a week    Frequency of Social Gatherings with Friends and Family: Once a week    Attends Religious Services: Never    Database Administrator or Organizations: Yes    Attends Engineer, Structural: 1 to 4 times per year    Marital Status: Married    Tobacco Counseling Counseling given: Not  Answered Tobacco comments: quit in 1992   Clinical Intake:  Pre-visit preparation completed: Yes  Pain : No/denies pain     BMI - recorded: 28.89 Nutritional Status: BMI 25 -29 Overweight Nutritional Risks: None Diabetes: No  How often do you need to have someone help you when you read instructions, pamphlets, or other  written materials from your doctor or pharmacy?: 1 - Never  Interpreter Needed?: No  Information entered by :: Ellouise Haws, LPN   Activities of Daily Living    01/27/2023    2:46 PM  In your present state of health, do you have any difficulty performing the following activities:  Hearing? 0  Vision? 0  Difficulty concentrating or making decisions? 0  Walking or climbing stairs? 0  Dressing or bathing? 0  Doing errands, shopping? 0  Preparing Food and eating ? N  Using the Toilet? N  In the past six months, have you accidently leaked urine? N  Do you have problems with loss of bowel control? N  Managing your Medications? N  Managing your Finances? N  Housekeeping or managing your Housekeeping? N    Patient Care Team: Katrinka Garnette KIDD, MD as PCP - General (Family Medicine) Kit Rush, MD as Consulting Physician (Orthopedic Surgery) Fleeta Smock, Lamar BROCKS, MD as Consulting Physician (Allergy and Immunology) Dermatology, [facility] as Consulting Physician Kenny[facility] Cy RAMAN, DO as Consulting Physician (Ophthalmology) Zelphia Redbird, PT as Physical Therapist (Physical Therapy)  Indicate any recent Medical Services you may have received from other than [facility] providers in the past year (date may be approximate).     Assessment:   This is a routine wellness examination for [place].  Hearing/Vision screen Hearing Screening - Comments:: Pt denies any hearing issues  Vision Screening - Comments:: Pt follows up with [facility] dr Kennyth for annual eye exams    Goals Addressed             This Visit's Progress    Patient Stated       Maintain health  and activity        Depression Screen    01/28/2023    8:40 AM 01/22/2022    8:51 AM 01/20/2021    8:31 AM 01/02/2021    8:58 AM 07/20/2020    8:23 AM 01/19/2020   10:05 AM 12/21/2019    8:54 AM  PHQ 2/9 Scores  PHQ - 2 Score 0 0 0 0 0 0 0  PHQ- 9 Score   3        Fall Risk    01/27/2023    2:46 PM 01/22/2022    8:53 AM 01/18/2022    3:14 PM 01/20/2021    8:21 AM 01/02/2021    8:59 AM  Fall Risk   Falls in the past year? 1 0 0 0 0  Number falls in past yr: 0 0 0 0 0  Injury with Fall? 0 0 0 0 0  Risk for fall due to : History of fall(s);Impaired balance/gait Impaired vision;Impaired balance/gait   Impaired vision;Impaired balance/gait  Follow up Falls prevention discussed Falls prevention discussed   Falls prevention discussed    MEDICARE RISK AT HOME: Medicare Risk at Home Any stairs in or around the home?: (Patient-Rptd) No If so, are there any without handrails?: (Patient-Rptd) No Home free of loose throw rugs in walkways, pet beds, electrical cords, etc?: (Patient-Rptd) Yes Adequate lighting in your home to reduce risk of falls?: (Patient-Rptd) Yes Life alert?: (Patient-Rptd) No Use of a cane, walker or w/c?: (Patient-Rptd) No Grab bars in the bathroom?: (Patient-Rptd) Yes Shower chair or bench in shower?: (Patient-Rptd) No Elevated toilet seat or a handicapped toilet?: (Patient-Rptd) Yes  TIMED UP AND GO:  Was the test performed?  Yes  Length of time to ambulate 10 feet: 20 sec Gait slow and steady without use  of assistive device    Cognitive Function:    01/03/2017    8:38 AM  MMSE - Mini Mental State Exam  Not completed: --        01/28/2023    8:43 AM 01/22/2022    8:55 AM 01/02/2021    9:01 AM 12/21/2019    9:00 AM 10/31/2018   10:33 AM  6CIT Screen  What Year? 0 points 0 points 0 points 0 points 0 points  What month? 0 points 0 points 0 points 0 points 0 points  What time? 0 points 0 points 0 points  0 points  Count back from 20 0 points 0 points 0 points  0 points 0 points  Months in reverse 0 points 0 points 0 points 0 points 0 points  Repeat phrase 0 points 0 points 0 points 0 points 0 points  Total Score 0 points 0 points 0 points  0 points    Immunizations Immunization History  Administered Date(s) Administered   Fluad Quad(high Dose 65+) 10/14/2018, 10/20/2021   Influenza Split 11/21/2011   Influenza Whole 11/18/2008, 01/11/2010   Influenza, High Dose Seasonal PF 12/20/2015, 11/19/2016, 02/01/2017, 11/19/2017   Influenza,inj,Quad PF,6+ Mos 11/11/2012, 11/18/2013, 12/07/2014   Influenza-Unspecified 10/26/2019, 11/03/2020, 10/30/2022   PFIZER(Purple Top)SARS-COV-2 Vaccination 02/20/2019, 03/17/2019, 09/01/2019, 10/16/2019, 04/29/2020   Pfizer Covid-19 Vaccine Bivalent Booster 30yrs & up 10/11/2020, 10/16/2021   Pneumococcal Conjugate-13 02/15/2015   Pneumococcal Polysaccharide-23 02/16/2016, 02/01/2017, 09/01/2019   Td 01/15/2005   Tdap 10/10/2016   Zoster Recombinant(Shingrix) 05/21/2017, 08/13/2017   Zoster, Live 07/14/2012    TDAP status: Up to date  Flu Vaccine status: Up to date  Pneumococcal vaccine status: Up to date  Covid-19 vaccine status: Information provided on how to obtain vaccines.   Qualifies for Shingles Vaccine? Yes   Zostavax completed Yes   Shingrix Completed?: Yes  Screening Tests Health Maintenance  Topic Date Due   COVID-19 Vaccine (8 - 2024-25 season) 09/16/2022   Medicare Annual Wellness (AWV)  01/28/2024   DTaP/Tdap/Td (3 - Td or Tdap) 10/11/2026   Colonoscopy  08/25/2030   Pneumonia Vaccine 66+ Years old  Completed   INFLUENZA VACCINE  Completed   Zoster Vaccines- Shingrix  Completed   HPV VACCINES  Aged Out    Health Maintenance  Health Maintenance Due  Topic Date Due   COVID-19 Vaccine (8 - 2024-25 season) 09/16/2022    Colorectal cancer screening: Type of screening: Colonoscopy. Completed 08/24/20. Repeat every 10 years  Additional Screening:  Vision Screening: Recommended  annual ophthalmology exams for early detection of glaucoma and other disorders of the eye. Is the patient up to date with their annual eye exam?  Yes  Who is the provider or what is the name of the office in which the patient attends annual eye exams? Dr Kennyth  If pt is not established with a provider, would they like to be referred to a provider to establish care? No .   Dental Screening: Recommended annual dental exams for proper oral hygiene   Community Resource Referral / Chronic Care Management: CRR required this visit?  No   CCM required this visit?  No     Plan:     I have personally reviewed and noted the following in the patient's chart:   Medical and social history Use of alcohol, tobacco or illicit drugs  Current medications and supplements including opioid prescriptions. Patient is not currently taking opioid prescriptions. Functional ability and status Nutritional status Physical activity Advanced directives List  of other physicians Hospitalizations, surgeries, and ER visits in previous 12 months Vitals Screenings to include cognitive, depression, and falls Referrals and appointments  In addition, I have reviewed and discussed with patient certain preventive protocols, quality metrics, and best practice recommendations. A written personalized care plan for preventive services as well as general preventive health recommendations were provided to patient.     Ellouise VEAR Haws, LPN   [id number]/[id number]   After Visit Summary: (MyChart) Due to this being a telephonic visit, the after visit summary with patients personalized plan was offered to patient via MyChart   Nurse Notes: none

## 2023-01-28 NOTE — Patient Instructions (Signed)
 [name] , Thank you for taking time to come for your Medicare Wellness Visit. I appreciate your ongoing commitment to your health goals. Please review the following plan we discussed and let me know if I can assist you in the future.   Referrals/Orders/Follow-Ups/Clinician Recommendations: maintain health and activity   This is a list of the screening recommended for you and due dates:  Health Maintenance  Topic Date Due   Flu Shot  08/16/2022   COVID-19 Vaccine (8 - 2024-25 season) 09/16/2022   Medicare Annual Wellness Visit  01/23/2023   DTaP/Tdap/Td vaccine (3 - Td or Tdap) 10/11/2026   Colon Cancer Screening  08/25/2030   Pneumonia Vaccine  Completed   Zoster (Shingles) Vaccine  Completed   HPV Vaccine  Aged Out    Advanced directives: (Copy Requested) Please bring a copy of your health care power of attorney and living will to the office to be added to your chart at your convenience.  Next Medicare Annual Wellness Visit scheduled for next year: Yes

## 2023-01-29 ENCOUNTER — Encounter: Payer: Self-pay | Admitting: Family Medicine

## 2023-01-29 ENCOUNTER — Ambulatory Visit: Payer: Medicare PPO | Admitting: Family Medicine

## 2023-01-29 VITALS — BP 138/78 | HR 64 | Temp 97.9°F | Ht 66.0 in | Wt 177.4 lb

## 2023-01-29 DIAGNOSIS — Z0000 Encounter for general adult medical examination without abnormal findings: Secondary | ICD-10-CM

## 2023-01-29 DIAGNOSIS — Z79899 Other long term (current) drug therapy: Secondary | ICD-10-CM

## 2023-01-29 DIAGNOSIS — Z125 Encounter for screening for malignant neoplasm of prostate: Secondary | ICD-10-CM

## 2023-01-29 DIAGNOSIS — E785 Hyperlipidemia, unspecified: Secondary | ICD-10-CM | POA: Diagnosis not present

## 2023-01-29 DIAGNOSIS — M21622 Bunionette of left foot: Secondary | ICD-10-CM

## 2023-01-29 DIAGNOSIS — I700 Atherosclerosis of aorta: Secondary | ICD-10-CM

## 2023-01-29 DIAGNOSIS — I10 Essential (primary) hypertension: Secondary | ICD-10-CM | POA: Diagnosis not present

## 2023-01-29 LAB — CBC WITH DIFFERENTIAL/PLATELET
Basophils Absolute: 0.1 10*3/uL (ref 0.0–0.1)
Basophils Relative: 0.7 % (ref 0.0–3.0)
Eosinophils Absolute: 0.1 10*3/uL (ref 0.0–0.7)
Eosinophils Relative: 1.1 % (ref 0.0–5.0)
HCT: 45.8 % (ref 39.0–52.0)
Hemoglobin: 15 g/dL (ref 13.0–17.0)
Lymphocytes Relative: 16 % (ref 12.0–46.0)
Lymphs Abs: 1.4 10*3/uL (ref 0.7–4.0)
MCHC: 32.7 g/dL (ref 30.0–36.0)
MCV: 90.1 fL (ref 78.0–100.0)
Monocytes Absolute: 0.7 10*3/uL (ref 0.1–1.0)
Monocytes Relative: 7.9 % (ref 3.0–12.0)
Neutro Abs: 6.5 10*3/uL (ref 1.4–7.7)
Neutrophils Relative %: 74.3 % (ref 43.0–77.0)
Platelets: 238 10*3/uL (ref 150.0–400.0)
RBC: 5.09 Mil/uL (ref 4.22–5.81)
RDW: 13.6 % (ref 11.5–15.5)
WBC: 8.7 10*3/uL (ref 4.0–10.5)

## 2023-01-29 LAB — LIPID PANEL
Cholesterol: 147 mg/dL (ref 0–200)
HDL: 63.2 mg/dL (ref >39.00)
LDL Cholesterol: 68 mg/dL (ref 0–99)
NonHDL: 83.38
Total CHOL/HDL Ratio: 2
Triglycerides: 76 mg/dL (ref 0.0–149.0)
VLDL: 15.2 mg/dL (ref 0.0–40.0)

## 2023-01-29 LAB — COMPREHENSIVE METABOLIC PANEL
ALT: 15 U/L (ref 0–53)
AST: 16 U/L (ref 0–37)
Albumin: 4.4 g/dL (ref 3.5–5.2)
Alkaline Phosphatase: 85 U/L (ref 39–117)
BUN: 18 mg/dL (ref 6–23)
CO2: 25 meq/L (ref 19–32)
Calcium: 9.4 mg/dL (ref 8.4–10.5)
Chloride: 104 meq/L (ref 96–112)
Creatinine, Ser: 0.77 mg/dL (ref 0.40–1.50)
GFR: 89.15 mL/min (ref >60.00)
Glucose, Bld: 84 mg/dL (ref 70–99)
Potassium: 3.9 meq/L (ref 3.5–5.1)
Sodium: 139 meq/L (ref 135–145)
Total Bilirubin: 0.9 mg/dL (ref 0.2–1.2)
Total Protein: 6.7 g/dL (ref 6.0–8.3)

## 2023-01-29 LAB — VITAMIN B12: Vitamin B-12: 304 pg/mL (ref 211–911)

## 2023-01-29 LAB — PSA, MEDICARE: PSA: 0.29 ng/mL (ref 0.10–4.00)

## 2023-01-29 NOTE — Progress Notes (Signed)
 Phone: [phone]   Subjective:  Patient presents today for their annual physical. Chief complaint-noted.   See problem oriented charting- ROS- full  review of systems was completed and negative  except for: Left foot pain, ongoing ankle pain  The following were reviewed and entered/updated in epic: Past Medical History:  Diagnosis Date   Adenomatous polyp of colon    Allergy    seasonal   Anxiety D   Arthritis    BPH (benign prostatic hyperplasia)    Cataract    Diverticulosis 09/16/2010   episode of diverticulitis   GERD (gastroesophageal reflux disease)    Hyperlipidemia    Hypertension    Low back pain    Patient Active Problem List   Diagnosis Date Noted   Chronic narcotic use 10/10/2016    Priority: High   Spinal stenosis 05/11/2020    Priority: Medium    Aortic atherosclerosis (HCC) 05/11/2020    Priority: Medium    Hiatal hernia 01/19/2020    Priority: Medium    Arthritis of right ankle 02/11/2014    Priority: Medium    BPH (benign prostatic hyperplasia) 12/17/2006    Priority: Medium    Hyperlipidemia 08/19/2006    Priority: Medium    Essential hypertension 08/19/2006    Priority: Medium    Allergic rhinitis 02/11/2014    Priority: Low   Family history of melanoma 02/11/2014    Priority: Low   GERD (gastroesophageal reflux disease) 02/11/2014    Priority: Low   Former smoker 02/11/2014    Priority: Low   INSOMNIA, CHRONIC 07/01/2009    Priority: Low   Actinic keratosis 12/17/2006    Priority: Low   History of colonic polyps 08/26/2006    Priority: Low   Pyogenic granuloma 08/21/2019    Priority: 1.   Mass of left finger 08/13/2019    Priority: 1.   Arthritis of left foot 08/27/2016    Priority: 1.   Past Surgical History:  Procedure Laterality Date   ANKLE FRACTURE SURGERY     chronic pain as result   COLONOSCOPY     FINGER SURGERY Left    bite by a crab   FRACTURE SURGERY     HERNIA REPAIR     jan 2012, [facility]    Family  History  Problem Relation Age of Onset   Arthritis Mother    Hearing loss Mother    Cancer Father        melanoma, cousin also died of it   Colon polyps Sister    Cancer Brother        melanoma   Cancer Paternal Uncle        Colon cancer   Colon cancer Paternal Uncle    Esophageal cancer Neg Hx    Stomach cancer Neg Hx    Rectal cancer Neg Hx     Medications- reviewed and updated Current Outpatient Medications  Medication Sig Dispense Refill   amLODipine -benazepril  (LOTREL) 5-20 MG capsule TAKE 1 CAPSULE BY MOUTH DAILY 90 capsule 1   atorvastatin  (LIPITOR) 40 MG tablet TAKE 1 TABLET BY MOUTH DAILY 90 tablet 1   Azelastine HCl 0.15 % SOLN Place 1 spray into the nose as needed.     Cholecalciferol (VITAMIN D3 MAXIMUM STRENGTH) 125 MCG (5000 UT) capsule Take 1 capsule (5,000 Units total) by mouth daily. 90 capsule 3   Cyanocobalamin  (VITAMIN B-12 PO) Take by mouth. Once weekly.     famotidine  (PEPCID ) 20 MG tablet Take 1 tablet (20 mg  total) by mouth at bedtime.     finasteride  (PROSCAR ) 5 MG tablet Take 1 tablet (5 mg total) by mouth daily. 90 tablet 3   fluticasone  (FLONASE ) 50 MCG/ACT nasal spray SPRAY TWO SPRAYS IN EACH NOSTRIL ONCE DAILY 48 g 1   ketoconazole  (NIZORAL ) 2 % cream Apply 1 Application topically daily. 60 g 0   meloxicam  (MOBIC ) 7.5 MG tablet TAKE 1 TABLET BY MOUTH DAILY 90 tablet 1   omeprazole  (PRILOSEC) 20 MG capsule TAKE ONE CAPSULE BY MOUTH EVERY MORNING 20 TO 30 MINUTES BEFORE BREAKFAST 90 capsule 3   No current facility-administered medications for this visit.    Allergies-reviewed and updated Allergies  Allergen Reactions   Dust Mite Mixed Allergen Ext [Mite (D. Farinae)]     Social History   Social History Narrative   Married >30 years in 2016. No children. 2 dogs.       Retired herbalist heavy [employer]      Hobbies: Rabbit hunting, fishing, duck hunt   Objective  Objective:  BP 138/78   Pulse 64   Temp 97.9 F (36.6 C)   Ht 5' 6  (1.676 m)   Wt 177 lb 6.4 oz (80.5 kg)   SpO2 99%   BMI 28.63 kg/m  Gen: NAD, resting comfortably HEENT: Mucous membranes are moist. Oropharynx normal Neck: no thyromegaly CV: RRR no murmurs rubs or gallops Lungs: CTAB no crackles, wheeze, rhonchi Abdomen: soft/nontender/nondistended/normal bowel sounds. No rebound or guarding.  Ext: no edema Skin: warm, dry Neuro: grossly normal, moves all extremities, PERRLA    Assessment and Plan  73 y.o. male presenting for annual physical.  Health Maintenance counseling: 1. Anticipatory guidance: Patient counseled regarding regular dental exams -q6 months, eye exams - twice yearly,  avoiding smoking and second hand smoke , limiting alcohol to 2 beverages per day - 7 a week white wine down from 10, no illicit drugs-prior marijuana but bothered his reflux so not using as much- very sparing.   2. Risk factor reduction:  Advised patient of need for regular exercise and diet rich and fruits and vegetables to reduce risk of heart attack and stroke.  Exercise-walking his dog some- still able to with bunionette issues- daily 30 minutes 3x a day.  Diet/weight management-Down 23 pounds in the last year by cutting out sugars and sweetened beverages and going for lean protein and salad, and walking dog. Pretty stable at home within 2-3 lbs in last 6 months Wt Readings from Last 3 Encounters:  01/29/23 177 lb 6.4 oz (80.5 kg)  01/28/23 179 lb (81.2 kg)  08/06/22 177 lb 9.6 oz (80.6 kg)  3. Immunizations/screenings/ancillary studies-fully up-to-date including COVID shot insuranc sept Immunization History  Administered Date(s) Administered   Fluad Quad(high Dose 65+) 10/14/2018, 10/20/2021   Influenza Split 11/21/2011   Influenza Whole 11/18/2008, 01/11/2010   Influenza, High Dose Seasonal PF 12/20/2015, 11/19/2016, 02/01/2017, 11/19/2017   Influenza,inj,Quad PF,6+ Mos 11/11/2012, 11/18/2013, 12/07/2014   Influenza-Unspecified 10/26/2019, 11/03/2020,  10/30/2022   PFIZER(Purple Top)SARS-COV-2 Vaccination 02/20/2019, 03/17/2019, 09/01/2019, 10/16/2019, 04/29/2020   Pfizer Covid-19 Vaccine Bivalent Booster 75yrs & up 10/11/2020, 10/16/2021   Pneumococcal Conjugate-13 02/15/2015   Pneumococcal Polysaccharide-23 02/16/2016, 02/01/2017, 09/01/2019   Td 01/15/2005   Tdap 10/10/2016   Zoster Recombinant(Shingrix) 05/21/2017, 08/13/2017   Zoster, Live 07/14/2012   4. Prostate cancer screening- due to finasteride  we will continue to trend.  Known BPH. Lab Results  Component Value Date   PSA 0.18 01/24/2022   PSA 0.22 01/20/2021  PSA 0.17 05/11/2020  5. Colon cancer screening - 08/24/20 with 10 year repeat planned - hx of colon polyps but not precancerous.   6. Skin cancer screening- yearly derm visit with family history melanoma .  advised regular sunscreen use. Denies worrisome, changing, or new skin lesions.  7. Smoking associated screening (lung cancer screening, AAA screen 65-75, UA)- Former smoker- AAA screening -2019.  Quit 1990s-no regular screening required 8. STD screening - monogamous and not needed    Status of chronic or acute concerns   # Left foot bunionette -following for the last 6 months. -hed like to sit down with podiatry- likely not ready for surgery yet - has somet things wife needs but wants to see what options are  #hypertension S: medication: amlodipine  benazepril  5-20 mg Home readings #s: no recent checks BP Readings from Last 3 Encounters:  01/29/23 138/78  01/28/23 128/78  08/06/22 132/80  A/P: high acceptable range but looked far better yesterday- continue current medications   #hyperlipidemia #aortic atherosclerosis with coronary artery calcium  also noted on imaging S: Medication: atorvastatin  40 mg Lab Results  Component Value Date   CHOL 152 01/24/2022   HDL 59.20 01/24/2022   LDLCALC 68 01/24/2022   LDLDIRECT 90.0 02/11/2014   TRIG 122.0 01/24/2022   CHOLHDL 3 01/24/2022  A/P: aortic  atherosclerosis (presumed stable)- LDL goal ideally <70 - update today and continue current medications for now   # Chronic pain-right ankle arthritis and spinal stenosis (ankle the larger issue) S: Medication: Prior chronic narcotics but now off since 2019-tolerating meloxicam  7.5 mg-we discussed importance of monitoring renal function q6 months   A/P: overall stable- monitor kidney function today  # BPH S:Medication: finasteride  5 mg. Nocturia better with less beer. Has to wipe well after urination but otherwise does well A/P: stable- continue current medicines    # GERD S:Medication: Omeprazole  20 mg in the morning and Pepcid  before bed at times B12 levels related to PPI use: taking B12 weekly 1000 mcg as of January 2024 (B12 under 350) Lab Results  Component Value Date   VITAMINB12 337 01/24/2022  A/P: reflux well controlled continue current medications With long term omeprazole  check B12 again and continue weekly med   Recommended follow up: Return in about 6 months (around 07/29/2023) for followup or sooner if needed.Schedule b4 you leave. Future Appointments  Date Time Provider Department Center  01/29/2024  8:40 AM [facility] ANNUAL WELLNESS VISIT 1 [facility]    Lab/Order associations: Not fasting   ICD-10-CM   1. Preventative health care  Z00.00     2. Essential hypertension  I10 Comprehensive metabolic panel    CBC with Differential/Platelet    3. Hyperlipidemia, unspecified hyperlipidemia type  E78.5 Comprehensive metabolic panel    CBC with Differential/Platelet    Lipid panel    4. Aortic atherosclerosis (HCC)  I70.0     5. High risk medication use  Z79.899 Vitamin B12    6. Screening for prostate cancer  Z12.5 PSA, Medicare    7. Bunionette of left foot  M21.622 Ambulatory referral to Podiatry      No orders of the defined types were placed in this encounter.   Return precautions advised.  Garnette Lukes, MD

## 2023-01-29 NOTE — Patient Instructions (Addendum)
 Call podiatry # listed below if you don't hear within a week  If you can find RSV date please let us  know  Please stop by lab before you go If you have mychart- we will send your results within 3 business days of us  receiving them.  If you do not have mychart- we will call you about results within 5 business days of us  receiving them.  *please also note that you will see labs on mychart as soon as they post. I will later go in and write notes on them- will say notes from Dr. Katrinka   Recommended follow up: Return in about 6 months (around 07/29/2023) for followup or sooner if needed.Schedule b4 you leave.

## 2023-02-08 ENCOUNTER — Ambulatory Visit: Payer: Medicare PPO | Admitting: Podiatry

## 2023-02-08 ENCOUNTER — Ambulatory Visit (INDEPENDENT_AMBULATORY_CARE_PROVIDER_SITE_OTHER): Payer: Medicare PPO

## 2023-02-08 ENCOUNTER — Encounter: Payer: Self-pay | Admitting: Podiatry

## 2023-02-08 VITALS — Ht 67.0 in | Wt 177.0 lb

## 2023-02-08 DIAGNOSIS — M7752 Other enthesopathy of left foot: Secondary | ICD-10-CM

## 2023-02-08 DIAGNOSIS — M79672 Pain in left foot: Secondary | ICD-10-CM | POA: Diagnosis not present

## 2023-02-08 DIAGNOSIS — Q666 Other congenital valgus deformities of feet: Secondary | ICD-10-CM

## 2023-02-08 NOTE — Progress Notes (Unsigned)
Subjective:  Patient ID: Matthew Watson, male    DOB: February 05, 1950,  MRN: [id number]  Chief Complaint  Patient presents with   Foot Pain    He reports his left foot has been hurting on and off for a while.     73 y.o. male presents with the above complaint.  Patient presents with complaint of left fifth metatarsophalangeal joint pain patient states pain for touch is progressive gotten worse worse with ambulation worse with pressure he has a tailor's bunion.  Has been hurting on and off for a while.  He has pes planovalgus deformity he does not wear any orthotics.  He wants to discuss treatment options for this.   Review of Systems: Negative except as noted in the HPI. Denies N/V/F/Ch.  Past Medical History:  Diagnosis Date   Adenomatous polyp of colon    Allergy    seasonal   Anxiety D   Arthritis    BPH (benign prostatic hyperplasia)    Cataract    Diverticulosis 09/16/2010   episode of diverticulitis   GERD (gastroesophageal reflux disease)    Hyperlipidemia    Hypertension    Low back pain     Current Outpatient Medications:    amLODipine-benazepril (LOTREL) 5-20 MG capsule, TAKE 1 CAPSULE BY MOUTH DAILY, Disp: 90 capsule, Rfl: 1   atorvastatin (LIPITOR) 40 MG tablet, TAKE 1 TABLET BY MOUTH DAILY, Disp: 90 tablet, Rfl: 1   Azelastine HCl 0.15 % SOLN, Place 1 spray into the nose as needed., Disp: , Rfl:    Cholecalciferol (VITAMIN D3 MAXIMUM STRENGTH) 125 MCG (5000 UT) capsule, Take 1 capsule (5,000 Units total) by mouth daily., Disp: 90 capsule, Rfl: 3   Cyanocobalamin (VITAMIN B-12 PO), Take by mouth. Once weekly., Disp: , Rfl:    famotidine (PEPCID) 20 MG tablet, Take 1 tablet (20 mg total) by mouth at bedtime., Disp: , Rfl:    finasteride (PROSCAR) 5 MG tablet, Take 1 tablet (5 mg total) by mouth daily., Disp: 90 tablet, Rfl: 3   fluticasone (FLONASE) 50 MCG/ACT nasal spray, SPRAY TWO SPRAYS IN EACH NOSTRIL ONCE DAILY, Disp: 48 g, Rfl: 1   ketoconazole (NIZORAL) 2 %  cream, Apply 1 Application topically daily., Disp: 60 g, Rfl: 0   meloxicam (MOBIC) 7.5 MG tablet, TAKE 1 TABLET BY MOUTH DAILY, Disp: 90 tablet, Rfl: 1   omeprazole (PRILOSEC) 20 MG capsule, TAKE ONE CAPSULE BY MOUTH EVERY MORNING 20 TO 30 MINUTES BEFORE BREAKFAST, Disp: 90 capsule, Rfl: 3  Social History   Tobacco Use  Smoking Status Former   Current packs/day: 0.00   Average packs/day: 0.3 packs/day for 10.0 years (2.5 ttl pk-yrs)   Types: Cigarettes   Start date: 03/22/1980   Quit date: 03/23/1990   Years since quitting: 32.9  Smokeless Tobacco Never  Tobacco Comments   quit in 1992    Allergies  Allergen Reactions   Dust Mite Mixed Allergen Ext [Mite (D. Farinae)]    Objective:  There were no vitals filed for this visit. Body mass index is 27.72 kg/m. Constitutional Well developed. Well nourished.  Vascular Dorsalis pedis pulses palpable bilaterally. Posterior tibial pulses palpable bilaterally. Capillary refill normal to all digits.  No cyanosis or clubbing noted. Pedal hair growth normal.  Neurologic Normal speech. Oriented to person, place, and time. Epicritic sensation to light touch grossly present bilaterally.  Dermatologic Nails well groomed and normal in appearance. No open wounds. No skin lesions.  Orthopedic: Pain on palpation left fifth  metatarsophalangeal joint mild pain with range of motion of the joint.  Tailor's bunion noted this is a nonreducible deformity.   Radiographs: 3 views of skeletally mature the left foot:Midfoot arthritis noted.  Tailor's bunion noted that an increase in lateral deviation angle.  No increase in intermetatarsal angle hammertoe contracture of fifth digit noted.  No other bony abnormalities identified.  Plantarflexed left fifth metatarsal noted Assessment:   1. Capsulitis of metatarsophalangeal (MTP) joint of left foot   2. Pes planovalgus    Plan:  Patient was evaluated and treated and all questions answered.  Left fifth MTP  capsulitis with underlying plantarflexed fifth metatarsal -All questions and concerns were discussed with the patient in extensive detail given the amount of pain that he is having he will benefit from strict steroid injection to help decrease inflammatory component associate with pain.  Patient agrees with plan like to proceed with steroid injection -A steroid injection was performed at left fifth using 1% plain Lidocaine and 10 mg of Kenalog. This was well tolerated.  Pes planovalgus -I explained to patient the etiology of pes planovalgus and relationship with Planter fasciitis and various treatment options were discussed.  Given patient foot structure in the setting of Planter fasciitis I believe patient will benefit from custom-made orthotics to help control the hindfoot motion support the arch of the foot and take the stress away from plantar fascial.  Patient agrees with the plan like to proceed with orthotics -Patient was casted for orthotics with offloading of fifth metatarsophalangeal joint bilaterally

## 2023-03-04 DIAGNOSIS — H2513 Age-related nuclear cataract, bilateral: Secondary | ICD-10-CM | POA: Diagnosis not present

## 2023-03-04 DIAGNOSIS — H02834 Dermatochalasis of left upper eyelid: Secondary | ICD-10-CM | POA: Diagnosis not present

## 2023-03-04 DIAGNOSIS — H1045 Other chronic allergic conjunctivitis: Secondary | ICD-10-CM | POA: Diagnosis not present

## 2023-03-04 DIAGNOSIS — H40013 Open angle with borderline findings, low risk, bilateral: Secondary | ICD-10-CM | POA: Diagnosis not present

## 2023-03-22 ENCOUNTER — Ambulatory Visit: Payer: Medicare PPO | Admitting: Podiatry

## 2023-03-22 DIAGNOSIS — M7752 Other enthesopathy of left foot: Secondary | ICD-10-CM | POA: Diagnosis not present

## 2023-03-22 DIAGNOSIS — Q666 Other congenital valgus deformities of feet: Secondary | ICD-10-CM

## 2023-03-22 NOTE — Progress Notes (Signed)
 Subjective:  Patient ID: Matthew Watson, male    DOB: 20-May-1950,  MRN: [id number]  Chief Complaint  Patient presents with   Capsulitis of metatarsophalangeal (MTP) joint of left foot    Pt stated he is here to pick up orthotics     73 y.o. male presents with the above complaint.  Patient presents for follow-up of left tailor's bunion he states is doing better injection helped.  He is here to pick up his orthotics denies any other acute complaints  Review of Systems: Negative except as noted in the HPI. Denies N/V/F/Ch.  Past Medical History:  Diagnosis Date   Adenomatous polyp of colon    Allergy    seasonal   Anxiety D   Arthritis    BPH (benign prostatic hyperplasia)    Cataract    Diverticulosis 09/16/2010   episode of diverticulitis   GERD (gastroesophageal reflux disease)    Hyperlipidemia    Hypertension    Low back pain     Current Outpatient Medications:    amLODipine-benazepril (LOTREL) 5-20 MG capsule, TAKE 1 CAPSULE BY MOUTH DAILY, Disp: 90 capsule, Rfl: 1   atorvastatin (LIPITOR) 40 MG tablet, TAKE 1 TABLET BY MOUTH DAILY, Disp: 90 tablet, Rfl: 1   Azelastine HCl 0.15 % SOLN, Place 1 spray into the nose as needed., Disp: , Rfl:    Cholecalciferol (VITAMIN D3 MAXIMUM STRENGTH) 125 MCG (5000 UT) capsule, Take 1 capsule (5,000 Units total) by mouth daily., Disp: 90 capsule, Rfl: 3   Cyanocobalamin (VITAMIN B-12 PO), Take by mouth. Once weekly., Disp: , Rfl:    famotidine (PEPCID) 20 MG tablet, Take 1 tablet (20 mg total) by mouth at bedtime., Disp: , Rfl:    finasteride (PROSCAR) 5 MG tablet, Take 1 tablet (5 mg total) by mouth daily., Disp: 90 tablet, Rfl: 3   fluticasone (FLONASE) 50 MCG/ACT nasal spray, SPRAY TWO SPRAYS IN EACH NOSTRIL ONCE DAILY, Disp: 48 g, Rfl: 1   ketoconazole (NIZORAL) 2 % cream, Apply 1 Application topically daily., Disp: 60 g, Rfl: 0   meloxicam (MOBIC) 7.5 MG tablet, TAKE 1 TABLET BY MOUTH DAILY, Disp: 90 tablet, Rfl: 1   omeprazole  (PRILOSEC) 20 MG capsule, TAKE ONE CAPSULE BY MOUTH EVERY MORNING 20 TO 30 MINUTES BEFORE BREAKFAST, Disp: 90 capsule, Rfl: 3  Social History   Tobacco Use  Smoking Status Former   Current packs/day: 0.00   Average packs/day: 0.3 packs/day for 10.0 years (2.5 ttl pk-yrs)   Types: Cigarettes   Start date: 03/22/1980   Quit date: 03/23/1990   Years since quitting: 33.0  Smokeless Tobacco Never  Tobacco Comments   quit in 1992    Allergies  Allergen Reactions   Dust Mite Mixed Allergen Ext [Mite (D. Farinae)]    Objective:  There were no vitals filed for this visit. There is no height or weight on file to calculate BMI. Constitutional Well developed. Well nourished.  Vascular Dorsalis pedis pulses palpable bilaterally. Posterior tibial pulses palpable bilaterally. Capillary refill normal to all digits.  No cyanosis or clubbing noted. Pedal hair growth normal.  Neurologic Normal speech. Oriented to person, place, and time. Epicritic sensation to light touch grossly present bilaterally.  Dermatologic Nails well groomed and normal in appearance. No open wounds. No skin lesions.  Orthopedic: No further pain on palpation left fifth metatarsophalangeal joint mild pain with range of motion of the joint.  Tailor's bunion noted this is a nonreducible deformity.   Radiographs: 3 views  of skeletally mature the left foot:Midfoot arthritis noted.  Tailor's bunion noted that an increase in lateral deviation angle.  No increase in intermetatarsal angle hammertoe contracture of fifth digit noted.  No other bony abnormalities identified.  Plantarflexed left fifth metatarsal noted Assessment:   No diagnosis found.  Plan:  Patient was evaluated and treated and all questions answered.  Left fifth MTP capsulitis with underlying plantarflexed fifth metatarsal -Clinically doing a lot better.  He denies any other acute complaints.  No acute complaints I discussed shoe gear modification orthotics he  states understanding  Pes planovalgus -I explained to patient the etiology of pes planovalgus and relationship with Planter fasciitis and various treatment options were discussed.  Given patient foot structure in the setting of Planter fasciitis I believe patient will benefit from custom-made orthotics to help control the hindfoot motion support the arch of the foot and take the stress away from plantar fascial.  Patient agrees with the plan like to proceed with orthotics -Orthotics were dispensed and they are functioning well

## 2023-03-25 ENCOUNTER — Other Ambulatory Visit: Payer: Self-pay | Admitting: Family Medicine

## 2023-04-09 DIAGNOSIS — D225 Melanocytic nevi of trunk: Secondary | ICD-10-CM | POA: Diagnosis not present

## 2023-04-09 DIAGNOSIS — L814 Other melanin hyperpigmentation: Secondary | ICD-10-CM | POA: Diagnosis not present

## 2023-04-09 DIAGNOSIS — L728 Other follicular cysts of the skin and subcutaneous tissue: Secondary | ICD-10-CM | POA: Diagnosis not present

## 2023-04-09 DIAGNOSIS — L821 Other seborrheic keratosis: Secondary | ICD-10-CM | POA: Diagnosis not present

## 2023-04-09 DIAGNOSIS — L570 Actinic keratosis: Secondary | ICD-10-CM | POA: Diagnosis not present

## 2023-06-09 ENCOUNTER — Other Ambulatory Visit: Payer: Self-pay | Admitting: Family Medicine

## 2023-07-29 ENCOUNTER — Ambulatory Visit: Payer: Medicare PPO | Admitting: Family Medicine

## 2023-07-29 ENCOUNTER — Ambulatory Visit: Payer: Self-pay | Admitting: Family Medicine

## 2023-07-29 VITALS — BP 136/70 | HR 74 | Temp 97.5°F | Ht 67.0 in | Wt 177.4 lb

## 2023-07-29 DIAGNOSIS — I10 Essential (primary) hypertension: Secondary | ICD-10-CM | POA: Diagnosis not present

## 2023-07-29 DIAGNOSIS — E663 Overweight: Secondary | ICD-10-CM | POA: Diagnosis not present

## 2023-07-29 DIAGNOSIS — E785 Hyperlipidemia, unspecified: Secondary | ICD-10-CM

## 2023-07-29 DIAGNOSIS — I700 Atherosclerosis of aorta: Secondary | ICD-10-CM

## 2023-07-29 DIAGNOSIS — E559 Vitamin D deficiency, unspecified: Secondary | ICD-10-CM

## 2023-07-29 DIAGNOSIS — E538 Deficiency of other specified B group vitamins: Secondary | ICD-10-CM | POA: Diagnosis not present

## 2023-07-29 DIAGNOSIS — Z131 Encounter for screening for diabetes mellitus: Secondary | ICD-10-CM | POA: Diagnosis not present

## 2023-07-29 LAB — CBC WITH DIFFERENTIAL/PLATELET
Basophils Absolute: 0 K/uL (ref 0.0–0.1)
Basophils Relative: 0.7 % (ref 0.0–3.0)
Eosinophils Absolute: 0.1 K/uL (ref 0.0–0.7)
Eosinophils Relative: 1.4 % (ref 0.0–5.0)
HCT: 44.4 % (ref 39.0–52.0)
Hemoglobin: 15.1 g/dL (ref 13.0–17.0)
Lymphocytes Relative: 24.9 % (ref 12.0–46.0)
Lymphs Abs: 1.6 K/uL (ref 0.7–4.0)
MCHC: 34 g/dL (ref 30.0–36.0)
MCV: 86.8 fl (ref 78.0–100.0)
Monocytes Absolute: 0.6 K/uL (ref 0.1–1.0)
Monocytes Relative: 8.5 % (ref 3.0–12.0)
Neutro Abs: 4.2 K/uL (ref 1.4–7.7)
Neutrophils Relative %: 64.5 % (ref 43.0–77.0)
Platelets: 211 K/uL (ref 150.0–400.0)
RBC: 5.11 Mil/uL (ref 4.22–5.81)
RDW: 13.1 % (ref 11.5–15.5)
WBC: 6.6 K/uL (ref 4.0–10.5)

## 2023-07-29 LAB — HEMOGLOBIN A1C: Hgb A1c MFr Bld: 5.7 % (ref 4.6–6.5)

## 2023-07-29 LAB — COMPREHENSIVE METABOLIC PANEL WITH GFR
ALT: 15 U/L (ref 0–53)
AST: 15 U/L (ref 0–37)
Albumin: 4.5 g/dL (ref 3.5–5.2)
Alkaline Phosphatase: 67 U/L (ref 39–117)
BUN: 15 mg/dL (ref 6–23)
CO2: 25 meq/L (ref 19–32)
Calcium: 9.4 mg/dL (ref 8.4–10.5)
Chloride: 105 meq/L (ref 96–112)
Creatinine, Ser: 0.8 mg/dL (ref 0.40–1.50)
GFR: 87.82 mL/min (ref >60.00)
Glucose, Bld: 103 mg/dL — ABNORMAL HIGH (ref 70–99)
Potassium: 3.7 meq/L (ref 3.5–5.1)
Sodium: 138 meq/L (ref 135–145)
Total Bilirubin: 1.1 mg/dL (ref 0.2–1.2)
Total Protein: 6.9 g/dL (ref 6.0–8.3)

## 2023-07-29 LAB — VITAMIN B12: Vitamin B-12: 612 pg/mL (ref 211–911)

## 2023-07-29 LAB — VITAMIN D 25 HYDROXY (VIT D DEFICIENCY, FRACTURES): VITD: 49.86 ng/mL (ref 30.00–100.00)

## 2023-07-29 MED ORDER — MELOXICAM 7.5 MG PO TABS: 7.5000 mg | ORAL_TABLET | Freq: Every day | ORAL | 1 refills | Status: AC

## 2023-07-29 MED ORDER — OMEPRAZOLE 20 MG PO CPDR: DELAYED_RELEASE_CAPSULE | ORAL | 3 refills | Status: DC

## 2023-07-29 MED ORDER — FINASTERIDE 5 MG PO TABS: 5.0000 mg | ORAL_TABLET | Freq: Every day | ORAL | 3 refills | Status: AC

## 2023-07-29 NOTE — Progress Notes (Signed)
 Phone [phone] In person visit   Subjective:   Matthew Watson is a 73 y.o. year old very pleasant male patient who presents for/with See problem oriented charting Chief Complaint  Patient presents with   Medical Management of Chronic Issues   Hypertension    Past Medical History-  Patient Active Problem List   Diagnosis Date Noted   Chronic narcotic use 10/10/2016    Priority: High   Spinal stenosis 05/11/2020    Priority: Medium    Aortic atherosclerosis (HCC) 05/11/2020    Priority: Medium    Hiatal hernia 01/19/2020    Priority: Medium    Arthritis of right ankle 02/11/2014    Priority: Medium    BPH (benign prostatic hyperplasia) 12/17/2006    Priority: Medium    Hyperlipidemia 08/19/2006    Priority: Medium    Essential hypertension 08/19/2006    Priority: Medium    Allergic rhinitis 02/11/2014    Priority: Low   Family history of melanoma 02/11/2014    Priority: Low   GERD (gastroesophageal reflux disease) 02/11/2014    Priority: Low   Former smoker 02/11/2014    Priority: Low   INSOMNIA, CHRONIC 07/01/2009    Priority: Low   Actinic keratosis 12/17/2006    Priority: Low   History of colonic polyps 08/26/2006    Priority: Low   Pyogenic granuloma 08/21/2019    Priority: 1.   Mass of left finger 08/13/2019    Priority: 1.   Arthritis of left foot 08/27/2016    Priority: 1.    Medications- reviewed and updated Current Outpatient Medications  Medication Sig Dispense Refill   amLODipine -benazepril  (LOTREL) 5-20 MG capsule TAKE 1 CAPSULE BY MOUTH DAILY 90 capsule 1   atorvastatin  (LIPITOR) 40 MG tablet TAKE 1 TABLET BY MOUTH DAILY 90 tablet 1   Azelastine HCl 0.15 % SOLN Place 1 spray into the nose as needed.     Cholecalciferol (VITAMIN D3 MAXIMUM STRENGTH) 125 MCG (5000 UT) capsule Take 1 capsule (5,000 Units total) by mouth daily. 90 capsule 3   Cyanocobalamin  (VITAMIN B-12 PO) Take by mouth. Once weekly.     famotidine  (PEPCID ) 20 MG tablet  Take 1 tablet (20 mg total) by mouth at bedtime.     fluticasone  (FLONASE ) 50 MCG/ACT nasal spray SPRAY TWO SPRAYS IN EACH NOSTRIL ONCE DAILY 48 g 1   finasteride  (PROSCAR ) 5 MG tablet Take 1 tablet (5 mg total) by mouth daily. 90 tablet 3   meloxicam  (MOBIC ) 7.5 MG tablet Take 1 tablet (7.5 mg total) by mouth daily. 90 tablet 1   omeprazole  (PRILOSEC) 20 MG capsule TAKE ONE CAPSULE BY MOUTH EVERY MORNING 20 TO 30 MINUTES BEFORE BREAKFAST 90 capsule 3   No current facility-administered medications for this visit.     Objective:  BP 136/70   Pulse 74   Temp (!) 97.5 F (36.4 C)   Ht 5' 7 (1.702 m)   Wt 177 lb 6.4 oz (80.5 kg)   SpO2 95%   BMI 27.78 kg/m  Gen: NAD, resting comfortably CV: RRR no murmurs rubs or gallops Lungs: CTAB no crackles, wheeze, rhonchi Ext: trace edema Skin: warm, dry    Assessment and Plan   # Referral to podiatry after January visit to assess bunionette-has had 2 visits-has required injection which was helpful- continues to have pain but does not want surgery yet as long as pain doesn't worsen  #hypertension S: medication: amlodipine  benazepril  5-20 mg BP Readings from Last 3 Encounters:  07/29/23 136/70  01/29/23 138/78  01/28/23 128/78  A/P: well controlled continue current medications   #hyperlipidemia #aortic atherosclerosis with coronary artery calcium  also noted on imaging S: Medication: atorvastatin  40 mg daily Lab Results  Component Value Date   CHOL 147 01/29/2023   HDL 63.20 01/29/2023   LDLCALC 68 01/29/2023   LDLDIRECT 90.0 02/11/2014   TRIG 76.0 01/29/2023   CHOLHDL 2 01/29/2023  A/P: lipids look great even for aortic atherosclerosis and coronary artery calcifications- continue current medications   # Chronic pain-right ankle arthritis and spinal stenosis (ankle the larger issue) S: Medication: Prior chronic narcotics but now off since 2019-tolerating meloxicam  7.5 mg-we discussed importance of monitoring renal function q6  months   A/P: reasonable control- continue current medications   # BPH S:Medication: finasteride  5 mg Lab Results  Component Value Date   PSA 0.29 01/29/2023   PSA 0.18 01/24/2022   PSA 0.22 01/20/2021  A/P: reasonable control- continue current medications    # GERD S:Medication: Omeprazole  20 mg in the morning and Pepcid  before bed at times- not needing much and doing ok B12 levels related to PPI use: taking B12 weekly 1000 mcg as of January 2024 (B12 under 350)-was still around 300 last visit and recommended daily- he is up to 2-3 days a week  A/P: reflux well controlled  B12 relative deficiency- check B12 levels today as up to 2-3 days a week   #Vitamin D  -  S: Medication: vitamin D  5000 units prescription- appears from DrRONITA Dopp- has been told low in past. Reports recently not taking this though A/P: with being off his vitamin D - check today   Recommended follow up: Return in about 6 months (around 01/29/2024) for physical or sooner if needed.Schedule b4 you leave. Future Appointments  Date Time Provider Department Center  01/29/2024  8:40 AM [facility] ANNUAL WELLNESS VISIT 1 [facility]   Lab/Order associations: cracker and coffee with creamer   ICD-10-CM   1. Essential hypertension  I10 Comprehensive metabolic panel with GFR    CBC with Differential/Platelet    2. Hyperlipidemia, unspecified hyperlipidemia type  E78.5 Comprehensive metabolic panel with GFR    CBC with Differential/Platelet    3. Aortic atherosclerosis (HCC)  I70.0     4. Avitaminosis D  E55.9 VITAMIN D  25 Hydroxy (Vit-D Deficiency, Fractures)    5. B12 deficiency  E53.8 Vitamin B12    6. Screening for diabetes mellitus  Z13.1 Hemoglobin A1c    7. Overweight  E66.3 Hemoglobin A1c      Meds ordered this encounter  Medications   finasteride  (PROSCAR ) 5 MG tablet    Sig: Take 1 tablet (5 mg total) by mouth daily.    Dispense:  90 tablet    Refill:  3   omeprazole  (PRILOSEC) 20 MG capsule    Sig:  TAKE ONE CAPSULE BY MOUTH EVERY MORNING 20 TO 30 MINUTES BEFORE BREAKFAST    Dispense:  90 capsule    Refill:  3   meloxicam  (MOBIC ) 7.5 MG tablet    Sig: Take 1 tablet (7.5 mg total) by mouth daily.    Dispense:  90 tablet    Refill:  1    Return precautions advised.  Garnette Lukes, MD

## 2023-07-29 NOTE — Patient Instructions (Addendum)
 Please stop by lab before you go If you have mychart- we will send your results within 3 business days of us  receiving them.  If you do not have mychart- we will call you about results within 5 business days of us  receiving them.  *please also note that you will see labs on mychart as soon as they post. I will later go in and write notes on them- will say notes from Dr. Katrinka   Recommended follow up: Return in about 6 months (around 01/29/2024) for physical or sooner if needed.Schedule b4 you leave.

## 2023-09-09 DIAGNOSIS — D3132 Benign neoplasm of left choroid: Secondary | ICD-10-CM | POA: Diagnosis not present

## 2023-09-09 DIAGNOSIS — H5203 Hypermetropia, bilateral: Secondary | ICD-10-CM | POA: Diagnosis not present

## 2023-09-09 DIAGNOSIS — H524 Presbyopia: Secondary | ICD-10-CM | POA: Diagnosis not present

## 2023-09-09 DIAGNOSIS — H40013 Open angle with borderline findings, low risk, bilateral: Secondary | ICD-10-CM | POA: Diagnosis not present

## 2023-09-09 DIAGNOSIS — H52223 Regular astigmatism, bilateral: Secondary | ICD-10-CM | POA: Diagnosis not present

## 2023-09-09 DIAGNOSIS — H2513 Age-related nuclear cataract, bilateral: Secondary | ICD-10-CM | POA: Diagnosis not present

## 2023-09-11 ENCOUNTER — Other Ambulatory Visit: Payer: Self-pay | Admitting: Family Medicine

## 2023-12-07 ENCOUNTER — Other Ambulatory Visit: Payer: Self-pay | Admitting: Family Medicine

## 2024-01-29 ENCOUNTER — Ambulatory Visit (INDEPENDENT_AMBULATORY_CARE_PROVIDER_SITE_OTHER): Payer: Medicare PPO

## 2024-01-29 DIAGNOSIS — Z0000 Encounter for general adult medical examination without abnormal findings: Secondary | ICD-10-CM

## 2024-01-29 NOTE — Patient Instructions (Signed)
 Matthew Watson,  Thank you for taking the time for your Medicare Wellness Visit. I appreciate your continued commitment to your health goals. Please review the care plan we discussed, and feel free to reach out if I can assist you further.  Please note that Annual Wellness Visits do not include a physical exam. Some assessments may be limited, especially if the visit was conducted virtually. If needed, we may recommend an in-person follow-up with your provider.  Ongoing Care Seeing your primary care provider every 3 to 6 months helps us  monitor your health and provide consistent, personalized care.   Referrals If a referral was made during today's visit and you haven't received any updates within two weeks, please contact the referred provider directly to check on the status.  Recommended Screenings:  Health Maintenance  Topic Date Due   Flu Shot  08/16/2023   COVID-19 Vaccine (9 - 2025-26 season) 09/16/2023   Medicare Annual Wellness Visit  01/28/2025   DTaP/Tdap/Td vaccine (3 - Td or Tdap) 10/11/2026   Colon Cancer Screening  08/25/2030   Pneumococcal Vaccine for age over 69  Completed   Zoster (Shingles) Vaccine  Completed   Meningitis B Vaccine  Aged Out       01/28/2023    8:40 AM  Advanced Directives  Does Patient Have a Medical Advance Directive? Yes  Type of Estate Agent of [place];Living will  Copy of Healthcare Power of Attorney in Chart? No - copy requested    Vision: Annual vision screenings are recommended for early detection of glaucoma, cataracts, and diabetic retinopathy. These exams can also reveal signs of chronic conditions such as diabetes and high blood pressure.  Dental: Annual dental screenings help detect early signs of oral cancer, gum disease, and other conditions linked to overall health, including heart disease and diabetes.  Please see the attached documents for additional preventive care recommendations.

## 2024-01-29 NOTE — Progress Notes (Signed)
 "  Chief Complaint  Patient presents with   Medicare Wellness     Subjective:   Matthew Watson is a 74 y.o. male who presents for a Medicare Annual Wellness Visit.  Visit info / Clinical Intake: Medicare Wellness Visit Type:: Subsequent Annual Wellness Visit Persons participating in visit and providing information:: patient Medicare Wellness Visit Mode:: In-person (required for WTM) Interpreter Needed?: No Pre-visit prep was completed: yes AWV questionnaire completed by patient prior to visit?: yes Date:: 01/25/24 Living arrangements:: (Patient-Rptd) lives with spouse/significant other Patient's Overall Health Status Rating: (Patient-Rptd) good Typical amount of pain: (Patient-Rptd) some Does pain affect daily life?: (!) (Patient-Rptd) yes Are you currently prescribed opioids?: no  Dietary Habits and Nutritional Risks How many meals a day?: (Patient-Rptd) 3 Eats fruit and vegetables daily?: (Patient-Rptd) yes Most meals are obtained by: (Patient-Rptd) preparing own meals In the last 2 weeks, have you had any of the following?: none Diabetic:: no  Functional Status Activities of Daily Living (to include ambulation/medication): (Patient-Rptd) Independent Ambulation: Independent with device- listed below Home Assistive Devices/Equipment: Eyeglasses Medication Administration: (Patient-Rptd) Independent Home Management (perform basic housework or laundry): (Patient-Rptd) Independent Manage your own finances?: (Patient-Rptd) yes Primary transportation is: (Patient-Rptd) driving Concerns about vision?: no *vision screening is required for WTM* Concerns about hearing?: (!) yes (slight HOH) Uses hearing aids?: no Hear whispered voice?: yes  Fall Screening Falls in the past year?: (Patient-Rptd) 0 Number of falls in past year: 0 Was there an injury with Fall?: 0 Fall Risk Category Calculator: 0 Patient Fall Risk Level: Low Fall Risk  Fall Risk Patient at Risk for Falls Due  to: Impaired balance/gait; Impaired mobility Fall risk Follow up: Falls evaluation completed  Home and Transportation Safety: All rugs have non-skid backing?: (Patient-Rptd) yes All stairs or steps have railings?: (Patient-Rptd) N/A, no stairs Grab bars in the bathtub or shower?: (Patient-Rptd) yes Have non-skid surface in bathtub or shower?: (Patient-Rptd) yes Good home lighting?: (Patient-Rptd) yes Regular seat belt use?: (Patient-Rptd) yes Hospital stays in the last year:: (Patient-Rptd) no  Cognitive Assessment Difficulty concentrating, remembering, or making decisions? : (Patient-Rptd) no Will 6CIT or Mini Cog be Completed: yes What year is it?: 0 points What month is it?: 0 points Give patient an address phrase to remember (5 components): [address] [place]  About what time is it?: 0 points Count backwards from 20 to 1: 0 points Say the months of the year in reverse: 0 points Repeat the address phrase from earlier: 0 points 6 CIT Score: 0 points  Advance Directives (For Healthcare) Does Patient Have a Medical Advance Directive?: Yes Type of Advance Directive: Healthcare Power of Attorney Copy of Healthcare Power of Attorney in Chart?: No - copy requested  Reviewed/Updated  Reviewed/Updated: Reviewed All (Medical, Surgical, Family, Medications, Allergies, Care Teams, Patient Goals)    Allergies (verified) Dust mite mixed allergen ext [mite (d. farinae)]   Current Medications (verified) Outpatient Encounter Medications as of 01/29/2024  Medication Sig   amLODipine -benazepril  (LOTREL) 5-20 MG capsule TAKE 1 CAPSULE BY MOUTH DAILY   atorvastatin  (LIPITOR) 40 MG tablet TAKE 1 TABLET BY MOUTH DAILY   Azelastine HCl 0.15 % SOLN Place 1 spray into the nose as needed.   Cholecalciferol (VITAMIN D3 MAXIMUM STRENGTH) 125 MCG (5000 UT) capsule Take 1 capsule (5,000 Units total) by mouth daily.   COMIRNATY syringe    Cyanocobalamin  (VITAMIN B-12 PO) Take by mouth. Once weekly.    famotidine  (PEPCID ) 20 MG tablet Take 1 tablet (20 mg  total) by mouth at bedtime.   finasteride  (PROSCAR ) 5 MG tablet Take 1 tablet (5 mg total) by mouth daily.   fluticasone  (FLONASE ) 50 MCG/ACT nasal spray SPRAY TWO SPRAYS IN EACH NOSTRIL ONCE DAILY   meloxicam  (MOBIC ) 7.5 MG tablet Take 1 tablet (7.5 mg total) by mouth daily.   omeprazole  (PRILOSEC) 20 MG capsule TAKE 1 CAPSULE BY MOUTH EVERY MORNING 20 TO 30 MINUTES BEFORE BREAKFAST   No facility-administered encounter medications on file as of 01/29/2024.    History: Past Medical History:  Diagnosis Date   Adenomatous polyp of colon    Allergy    seasonal   Anxiety D   Arthritis    BPH (benign prostatic hyperplasia)    Cataract    Diverticulosis 09/16/2010   episode of diverticulitis   GERD (gastroesophageal reflux disease)    Hyperlipidemia    Hypertension    Low back pain    Past Surgical History:  Procedure Laterality Date   ANKLE FRACTURE SURGERY     chronic pain as result   COLONOSCOPY     FINGER SURGERY Left    bite by a crab   FRACTURE SURGERY     HERNIA REPAIR     jan 2012, [facility]   Family History  Problem Relation Age of Onset   Arthritis Mother    Hearing loss Mother    Cancer Father        melanoma, cousin also died of it   Colon polyps Sister    Cancer Brother        melanoma   Cancer Paternal Uncle        Colon cancer   Colon cancer Paternal Uncle    Esophageal cancer Neg Hx    Stomach cancer Neg Hx    Rectal cancer Neg Hx    Social History   Occupational History   Occupation: retired  Tobacco Use   Smoking status: Former    Current packs/day: 0.00    Average packs/day: 0.3 packs/day for 11.7 years (3.0 ttl pk-yrs)    Types: Cigarettes    Start date: 03/22/1980    Quit date: 03/23/1990    Years since quitting: 33.8   Smokeless tobacco: Never   Tobacco comments:    quit in 1992  Vaping Use   Vaping status: Never Used  Substance and Sexual Activity   Alcohol use: Yes     Alcohol/week: 11.0 standard drinks of alcohol    Types: 4 Glasses of wine, 7 Shots of liquor per week   Drug use: Not Currently    Types: Marijuana   Sexual activity: Not Currently   Tobacco Counseling Counseling given: Not Answered Tobacco comments: quit in 1992  SDOH Screenings   Food Insecurity: No Food Insecurity (01/25/2024)  Housing: Low Risk (01/25/2024)  Transportation Needs: No Transportation Needs (01/25/2024)  Utilities: Not At Risk (01/29/2024)  Alcohol Screen: Low Risk (01/25/2024)  Depression (PHQ2-9): Low Risk (01/29/2024)  Financial Resource Strain: Low Risk (01/25/2024)  Physical Activity: Sufficiently Active (01/25/2024)  Social Connections: Moderately Integrated (01/25/2024)  Stress: No Stress Concern Present (01/25/2024)  Tobacco Use: Medium Risk (01/29/2024)  Health Literacy: Adequate Health Literacy (01/29/2024)   See flowsheets for full screening details  Depression Screen PHQ 2 & 9 Depression Scale- Over the past 2 weeks, how often have you been bothered by any of the following problems? Little interest or pleasure in doing things: 0 Feeling down, depressed, or hopeless (PHQ Adolescent also includes...irritable): 0 PHQ-2 Total Score: 0 Trouble falling  or staying asleep, or sleeping too much: 0 Feeling tired or having little energy: 0 Poor appetite or overeating (PHQ Adolescent also includes...weight loss): 0 Feeling bad about yourself - or that you are a failure or have let yourself or your family down: 0 Trouble concentrating on things, such as reading the newspaper or watching television (PHQ Adolescent also includes...like school work): 0 Moving or speaking so slowly that other people could have noticed. Or the opposite - being so fidgety or restless that you have been moving around a lot more than usual: 0 Thoughts that you would be better off dead, or of hurting yourself in some way: 0 PHQ-9 Total Score: 0 If you checked off any problems, how difficult have  these problems made it for you to do your work, take care of things at home, or get along with other people?: Not difficult at all     Goals Addressed               This Visit's Progress     loose belly weight (pt-stated)        Loose  belly weight              Objective:    Today's Vitals   01/29/24 0840  BP: 134/80  Pulse: 69  Temp: 98 F (36.7 C)  SpO2: 94%  Weight: 181 lb 3.2 oz (82.2 kg)  Height: 5' 7 (1.702 m)   Body mass index is 28.38 kg/m.  Hearing/Vision screen Hearing Screening - Comments:: Slight HOH  Vision Screening - Comments:: Wears rx glasses - up to date with routine eye exams with Dr Devere Kitty at [facility]  Immunizations and Health Maintenance Health Maintenance  Topic Date Due   COVID-19 Vaccine (10 - Pfizer risk 2025-26 season) 04/08/2024   Medicare Annual Wellness (AWV)  01/28/2025   DTaP/Tdap/Td (3 - Td or Tdap) 10/11/2026   Colonoscopy  08/25/2030   Pneumococcal Vaccine: 50+ Years  Completed   Influenza Vaccine  Completed   Zoster Vaccines- Shingrix  Completed   Meningococcal B Vaccine  Aged Out        Assessment/Plan:  This is a routine wellness examination for [place].  Patient Care Team: Katrinka Garnette KIDD, MD as PCP - General (Family Medicine) Kit Rush, MD as Consulting Physician (Orthopedic Surgery) Fleeta Smock, Lamar BROCKS, MD as Consulting Physician (Allergy and Immunology) Dermatology, [facility] as Consulting Physician Kitty Cy RAMAN, DO as Consulting Physician (Ophthalmology) Zelphia Redbird, PT as Physical Therapist (Physical Therapy)  I have personally reviewed and noted the following in the patients chart:   Medical and social history Use of alcohol, tobacco or illicit drugs  Current medications and supplements including opioid prescriptions. Functional ability and status Nutritional status Physical activity Advanced directives List of other physicians Hospitalizations, surgeries, and ER visits in  previous 12 months Vitals Screenings to include cognitive, depression, and falls Referrals and appointments  No orders of the defined types were placed in this encounter.  In addition, I have reviewed and discussed with patient certain preventive protocols, quality metrics, and best practice recommendations. A written personalized care plan for preventive services as well as general preventive health recommendations were provided to patient.   Ellouise VEAR Haws, LPN   8/85/7973   Return in about 1 year (around 02/01/2025).  After Visit Summary: (In Person-Printed) AVS printed and given to the patient  Nurse Notes: No voiced or noted concerns at this time   "

## 2024-02-05 ENCOUNTER — Encounter: Payer: Self-pay | Admitting: Family Medicine

## 2024-02-05 ENCOUNTER — Ambulatory Visit: Admitting: Family Medicine

## 2024-02-05 VITALS — BP 144/84 | HR 76 | Temp 98.0°F | Ht 67.0 in | Wt 181.8 lb

## 2024-02-05 DIAGNOSIS — E663 Overweight: Secondary | ICD-10-CM

## 2024-02-05 DIAGNOSIS — Z79899 Other long term (current) drug therapy: Secondary | ICD-10-CM

## 2024-02-05 DIAGNOSIS — I10 Essential (primary) hypertension: Secondary | ICD-10-CM

## 2024-02-05 DIAGNOSIS — Z131 Encounter for screening for diabetes mellitus: Secondary | ICD-10-CM | POA: Diagnosis not present

## 2024-02-05 DIAGNOSIS — Z0000 Encounter for general adult medical examination without abnormal findings: Secondary | ICD-10-CM

## 2024-02-05 DIAGNOSIS — E785 Hyperlipidemia, unspecified: Secondary | ICD-10-CM

## 2024-02-05 DIAGNOSIS — Z125 Encounter for screening for malignant neoplasm of prostate: Secondary | ICD-10-CM

## 2024-02-05 LAB — URINALYSIS, ROUTINE W REFLEX MICROSCOPIC
Bilirubin Urine: NEGATIVE
Hgb urine dipstick: NEGATIVE
Ketones, ur: NEGATIVE
Leukocytes,Ua: NEGATIVE
Nitrite: NEGATIVE
RBC / HPF: NONE SEEN
Specific Gravity, Urine: 1.01 (ref 1.000–1.030)
Total Protein, Urine: NEGATIVE
Urine Glucose: NEGATIVE
Urobilinogen, UA: 0.2 (ref 0.0–1.0)
pH: 7.5 (ref 5.0–8.0)

## 2024-02-05 LAB — COMPREHENSIVE METABOLIC PANEL WITH GFR
ALT: 13 U/L (ref 3–53)
AST: 15 U/L (ref 5–37)
Albumin: 4.4 g/dL (ref 3.5–5.2)
Alkaline Phosphatase: 80 U/L (ref 39–117)
BUN: 14 mg/dL (ref 6–23)
CO2: 29 meq/L (ref 19–32)
Calcium: 9.8 mg/dL (ref 8.4–10.5)
Chloride: 103 meq/L (ref 96–112)
Creatinine, Ser: 0.79 mg/dL (ref 0.40–1.50)
GFR: 87.83 mL/min
Glucose, Bld: 82 mg/dL (ref 70–99)
Potassium: 4.2 meq/L (ref 3.5–5.1)
Sodium: 139 meq/L (ref 135–145)
Total Bilirubin: 1 mg/dL (ref 0.2–1.2)
Total Protein: 7 g/dL (ref 6.0–8.3)

## 2024-02-05 LAB — HEMOGLOBIN A1C: Hgb A1c MFr Bld: 5.4 % (ref 4.6–6.5)

## 2024-02-05 LAB — LIPID PANEL
Cholesterol: 151 mg/dL (ref 28–200)
HDL: 74.8 mg/dL
LDL Cholesterol: 62 mg/dL (ref 10–99)
NonHDL: 76.2
Total CHOL/HDL Ratio: 2
Triglycerides: 73 mg/dL (ref 10.0–149.0)
VLDL: 14.6 mg/dL (ref 0.0–40.0)

## 2024-02-05 LAB — CBC WITH DIFFERENTIAL/PLATELET
Basophils Absolute: 0.1 K/uL (ref 0.0–0.1)
Basophils Relative: 0.7 % (ref 0.0–3.0)
Eosinophils Absolute: 0.1 K/uL (ref 0.0–0.7)
Eosinophils Relative: 0.7 % (ref 0.0–5.0)
HCT: 45.5 % (ref 39.0–52.0)
Hemoglobin: 15.3 g/dL (ref 13.0–17.0)
Lymphocytes Relative: 15.5 % (ref 12.0–46.0)
Lymphs Abs: 1.4 K/uL (ref 0.7–4.0)
MCHC: 33.7 g/dL (ref 30.0–36.0)
MCV: 88 fl (ref 78.0–100.0)
Monocytes Absolute: 0.8 K/uL (ref 0.1–1.0)
Monocytes Relative: 8.6 % (ref 3.0–12.0)
Neutro Abs: 6.9 K/uL (ref 1.4–7.7)
Neutrophils Relative %: 74.5 % (ref 43.0–77.0)
Platelets: 234 K/uL (ref 150.0–400.0)
RBC: 5.17 Mil/uL (ref 4.22–5.81)
RDW: 13.4 % (ref 11.5–15.5)
WBC: 9.3 K/uL (ref 4.0–10.5)

## 2024-02-05 LAB — MICROALBUMIN / CREATININE URINE RATIO
Creatinine,U: 33.6 mg/dL
Microalb Creat Ratio: UNDETERMINED mg/g (ref 0.0–30.0)
Microalb, Ur: <0.7 mg/dL

## 2024-02-05 LAB — PSA, MEDICARE: PSA: 0.2 ng/mL (ref 0.10–4.00)

## 2024-02-05 LAB — VITAMIN B12: Vitamin B-12: 445 pg/mL (ref 211–911)

## 2024-02-05 NOTE — Patient Instructions (Addendum)
 Blood pressure looked great last week- suspect well controlled - could be slightly high with coffee. Check about 3 times a week at home for 2 weeks and update me in about  2 weeks- continue current medications for now   with throat congestion issues advised trial of Pepcid  before bed or before dinner for next 2 weeks to see if makes a difference   Please stop by lab before you go If you have mychart- we will send your results within 3 business days of us  receiving them.  If you do not have mychart- we will call you about results within 5 business days of us  receiving them.  *please also note that you will see labs on mychart as soon as they post. I will later go in and write notes on them- will say notes from Dr. Katrinka   Recommended follow up: Return in about 6 months (around 08/04/2024) for followup or sooner if needed.Schedule b4 you leave.

## 2024-02-05 NOTE — Progress Notes (Signed)
 " Phone: [phone]   Subjective:  Patient presents today for their annual physical. Chief complaint-noted.   See problem oriented charting- ROS- full  review of systems was completed and negative  except for topics noted under acute/chronic concerns  The following were reviewed and entered/updated in epic: Past Medical History:  Diagnosis Date   Adenomatous polyp of colon    Allergy    seasonal   Anxiety D   Arthritis    BPH (benign prostatic hyperplasia)    Cataract    Diverticulosis 09/16/2010   episode of diverticulitis   GERD (gastroesophageal reflux disease)    Hyperlipidemia    Hypertension    Low back pain    Patient Active Problem List   Diagnosis Date Noted   Chronic narcotic use 10/10/2016    Priority: High   Spinal stenosis 05/11/2020    Priority: Medium    Aortic atherosclerosis 05/11/2020    Priority: Medium    Hiatal hernia 01/19/2020    Priority: Medium    Arthritis of right ankle 02/11/2014    Priority: Medium    BPH (benign prostatic hyperplasia) 12/17/2006    Priority: Medium    Hyperlipidemia 08/19/2006    Priority: Medium    Essential hypertension 08/19/2006    Priority: Medium    Allergic rhinitis 02/11/2014    Priority: Low   Family history of melanoma 02/11/2014    Priority: Low   GERD (gastroesophageal reflux disease) 02/11/2014    Priority: Low   Former smoker 02/11/2014    Priority: Low   INSOMNIA, CHRONIC 07/01/2009    Priority: Low   Actinic keratosis 12/17/2006    Priority: Low   History of colonic polyps 08/26/2006    Priority: Low   Pyogenic granuloma 08/21/2019    Priority: 1.   Mass of left finger 08/13/2019    Priority: 1.   Arthritis of left foot 08/27/2016    Priority: 1.   Past Surgical History:  Procedure Laterality Date   ANKLE FRACTURE SURGERY     chronic pain as result   COLONOSCOPY     FINGER SURGERY Left    bite by a crab   FRACTURE SURGERY     HERNIA REPAIR     jan 2012, [facility]    Family  History  Problem Relation Age of Onset   Arthritis Mother    Hearing loss Mother    Cancer Father        melanoma, cousin also died of it   Colon polyps Sister    Cancer Brother        melanoma   Cancer Paternal Uncle        Colon cancer   Colon cancer Paternal Uncle    Esophageal cancer Neg Hx    Stomach cancer Neg Hx    Rectal cancer Neg Hx     Medications- reviewed and updated Current Outpatient Medications  Medication Sig Dispense Refill   amLODipine -benazepril  (LOTREL) 5-20 MG capsule TAKE 1 CAPSULE BY MOUTH DAILY 90 capsule 1   atorvastatin  (LIPITOR) 40 MG tablet TAKE 1 TABLET BY MOUTH DAILY 90 tablet 1   Azelastine HCl 0.15 % SOLN Place 1 spray into the nose as needed.     Cholecalciferol (VITAMIN D3 MAXIMUM STRENGTH) 125 MCG (5000 UT) capsule Take 1 capsule (5,000 Units total) by mouth daily. 90 capsule 3   COMIRNATY syringe      Cyanocobalamin  (VITAMIN B-12 PO) Take by mouth. Once weekly.     famotidine  (PEPCID ) 20 MG  tablet Take 1 tablet (20 mg total) by mouth at bedtime.     finasteride  (PROSCAR ) 5 MG tablet Take 1 tablet (5 mg total) by mouth daily. 90 tablet 3   fluticasone  (FLONASE ) 50 MCG/ACT nasal spray SPRAY TWO SPRAYS IN EACH NOSTRIL ONCE DAILY 48 g 1   meloxicam  (MOBIC ) 7.5 MG tablet Take 1 tablet (7.5 mg total) by mouth daily. 90 tablet 1   omeprazole  (PRILOSEC) 20 MG capsule TAKE 1 CAPSULE BY MOUTH EVERY MORNING 20 TO 30 MINUTES BEFORE BREAKFAST 90 capsule 3   No current facility-administered medications for this visit.    Allergies-reviewed and updated Allergies[1]  Social History   Social History Narrative   Married 39 years in 2026 together over 50. No children. 2 dogs.       Retired herbalist [employer]      Hobbies: Rabbit hunting, fishing, duck hunt   Objective  Objective:  BP (!) 144/84   Pulse 76   Temp 98 F (36.7 C) (Temporal)   Ht 5' 7 (1.702 m)   Wt 181 lb 12.8 oz (Matthew.5 kg)   SpO2 96%   BMI 28.47 kg/m  Gen: NAD,  resting comfortably HEENT: Mucous membranes are moist. Oropharynx normal Neck: no thyromegaly CV: RRR no murmurs rubs or gallops Lungs: CTAB no crackles, wheeze, rhonchi Abdomen: soft/nontender/nondistended/normal bowel sounds. No rebound or guarding.  Ext: no edema Skin: warm, dry Neuro: grossly normal, moves all extremities, PERRLA   Assessment and Plan  74 y.o. Watson presenting for annual physical.  Health Maintenance counseling: 1. Anticipatory guidance: Patient counseled regarding regular dental exams -q6 months, eye exams -twice yearly- may need cataract surgery this spring,  avoiding smoking and second hand smoke , limiting alcohol to 2 beverages per day - about 1 a day right now, no illicit drugs -sparing marijuana.   2. Risk factor reduction:  Advised patient of need for regular exercise and diet rich and fruits and vegetables to reduce risk of heart attack and stroke.  Exercise-  walks 45 minutes with dog when weather good despite bunionette issues Diet/weight management-weight up 4 lbs but had lost 23 lbs last year improving diet and walking dog. Slightly up over holidays Wt Readings from Last 3 Encounters:  02/05/24 181 lb 12.8 oz (Matthew.5 kg)  01/29/24 181 lb 3.2 oz (Matthew.2 kg)  07/29/23 177 lb 6.4 oz (80.5 kg)  3. Immunizations/screenings/ancillary studies- up to date . RSV a few years ago Immunization History  Administered Date(s) Administered   Fluad Quad(high Dose 65+) 10/14/2018, 10/20/2021   INFLUENZA, HIGH DOSE SEASONAL PF 12/20/2015, 11/19/2016, 02/01/2017, 11/19/2017   Influenza Split 11/21/2011   Influenza Whole 11/18/2008, 01/11/2010   Influenza,inj,Quad PF,6+ Mos 11/11/2012, 11/18/2013, 12/07/2014   Influenza-Unspecified 10/26/2019, 11/03/2020, 10/31/2023   Moderna Covid-19 Fall Seasonal Vaccine 39yrs & older 10/09/2023   PFIZER(Purple Top)SARS-COV-2 Vaccination 02/20/2019, 03/17/2019, 09/01/2019, 10/16/2019, 04/29/2020   Pfizer Covid-19 Vaccine Bivalent Booster  76yrs & up 10/11/2020, 10/16/2021   Pfizer(Comirnaty)Fall Seasonal Vaccine 12 years and older 10/10/2023   Pneumococcal Conjugate-13 02/15/2015   Pneumococcal Polysaccharide-23 02/16/2016, 02/01/2017, 09/01/2019   Td 01/15/2005   Tdap 10/10/2016   Unspecified SARS-COV-2 Vaccination 10/06/2022   Zoster Recombinant(Shingrix) 05/21/2017, 08/13/2017   Zoster, Live 07/14/2012   4. Prostate cancer screening- continue to check on finasteride  to make sure not increasing Lab Results  Component Value Date   PSA 0.29 01/29/2023   PSA 0.18 01/24/2022   PSA 0.22 01/20/2021  . Colon cancer screening - 08/24/20 with 10  year repeat planned - hx of colon polyps but not precancerous.   6. Skin cancer screening- yearly derm visit with family history melanoma .  advised regular sunscreen use. Denies worrisome, changing, or new skin lesions.  7. Smoking associated screening (lung cancer screening, AAA screen 65-75, UA)- Former smoker- AAA screening -2019.  Quit 1990s-no regular screening required 8. STD screening - monogamous and not needed    Status of chronic or acute concerns   #hypertension S: medication: amlodipine  benazepril  5-20 mg BP Readings from Last 3 Encounters:  02/05/24 (!) 144/84  01/29/24 134/80  07/29/23 136/70  A/P: Blood pressure looked great last week- suspect well controlled - could be slightly high with coffee. Check about 3 times a week at home for 2 weeks and update me in about  2 weeks- continue current medications for now   #hyperlipidemia #aortic atherosclerosis with coronary artery calciuma lso noted on imaging S: Medication: atorvastatin  40 mg daily   Lab Results  Component Value Date   CHOL 147 01/29/2023   HDL 63.20 01/29/2023   LDLCALC 68 01/29/2023   LDLDIRECT 90.0 02/11/2014   TRIG 76.0 01/29/2023   CHOLHDL 2 01/29/2023  A/P: #s at ideal goal LDL under 70 last year- continue current medications as long as stable today  # Chronic pain-right ankle arthritis and  spinal stenosis (ankle the larger issue) S: Medication: Prior chronic narcotics but now off since 2019-tolerating meloxicam  7.5 mg-we discussed importance of monitoring renal function q6 months   A/P: check CMP today to monitor- ongoing pain  # BPH S:Medication: finasteride  5 mg. Nocturia every 3-4 hours A/P: still helpful overall- continue current medications  . Some congestion in sinuses and throats when got up- tried to work with allergist before- could have reflux element  # GERD S:Medication: Omeprazole  20 mg in the morning and Pepcid  before bed at times- has not tried nightly lately B12 levels related to PPI use: taking B12 weekly 1000 mcg as of January 2024 (B12 under 350) A/P: with throat congestion issues advised trial of Pepcid  before bed or before dinner for next 2 weeks to see if makes a difference   #Vitamin D  deficiency- recheck at least every 12-18 months but ok last visit off of vitamin D   #Left bunionette- saw podiatry Dr. Tobie in 2025- holding off on surgery . Prior injection helpful but still hurts   Recommended follow up: Return in about 6 months (around 08/04/2024) for followup or sooner if needed.Schedule b4 you leave. Future Appointments  Date Time Provider Department Center  02/11/2025  8:00 AM [facility] ANNUAL WELLNESS VISIT 1 [facility]   Lab/Order associations:NOT fasting   ICD-10-CM   1. Preventative health care  Z00.00     2. Screening for prostate cancer  Z12.5     3. High risk medication use  Z79.899     4. Hyperlipidemia, unspecified hyperlipidemia type  E78.5     5. Essential hypertension  I10     6. Screening for diabetes mellitus  Z13.1     7. Overweight  E66.3       No orders of the defined types were placed in this encounter.   Return precautions advised.  Garnette Lukes, MD      [1]  Allergies Allergen Reactions   Dust Mite Mixed Allergen Ext [Mite (D. Farinae)]    "

## 2024-02-13 ENCOUNTER — Ambulatory Visit: Payer: Self-pay | Admitting: Family Medicine

## 2024-02-20 ENCOUNTER — Telehealth: Payer: Self-pay | Admitting: Family Medicine

## 2024-02-20 NOTE — Telephone Encounter (Signed)
 LVM for pt to reschedule their 02/08/2025 Physical as provider will no longer be in office that day

## 2024-08-04 ENCOUNTER — Ambulatory Visit: Admitting: Family Medicine

## 2025-02-08 ENCOUNTER — Encounter: Admitting: Family Medicine

## 2025-02-11 ENCOUNTER — Ambulatory Visit
